# Patient Record
Sex: Male | Born: 1980 | Race: Black or African American | Hispanic: No | Marital: Single | Smoking: Current every day smoker
Health system: Southern US, Community
[De-identification: ages and names within clinical notes are randomized; demographics above are authoritative.]

## PROBLEM LIST (undated history)

## (undated) DIAGNOSIS — F1490 Cocaine use, unspecified, uncomplicated: Secondary | ICD-10-CM

## (undated) DIAGNOSIS — Z7901 Long term (current) use of anticoagulants: Secondary | ICD-10-CM

## (undated) DIAGNOSIS — M545 Low back pain, unspecified: Secondary | ICD-10-CM

## (undated) DIAGNOSIS — M4804 Spinal stenosis, thoracic region: Secondary | ICD-10-CM

## (undated) DIAGNOSIS — J449 Chronic obstructive pulmonary disease, unspecified: Secondary | ICD-10-CM

## (undated) DIAGNOSIS — F32A Depression, unspecified: Secondary | ICD-10-CM

## (undated) DIAGNOSIS — I2699 Other pulmonary embolism without acute cor pulmonale: Secondary | ICD-10-CM

## (undated) DIAGNOSIS — I10 Essential (primary) hypertension: Secondary | ICD-10-CM

## (undated) DIAGNOSIS — Z720 Tobacco use: Secondary | ICD-10-CM

## (undated) DIAGNOSIS — Z9289 Personal history of other medical treatment: Secondary | ICD-10-CM

## (undated) DIAGNOSIS — I82409 Acute embolism and thrombosis of unspecified deep veins of unspecified lower extremity: Secondary | ICD-10-CM

## (undated) HISTORY — DX: Long term (current) use of anticoagulants: Z79.01

## (undated) HISTORY — DX: Chronic obstructive pulmonary disease, unspecified: J44.9

## (undated) HISTORY — DX: Low back pain, unspecified: M54.50

## (undated) HISTORY — DX: Depression, unspecified: F32.A

## (undated) HISTORY — DX: Tobacco use: Z72.0

## (undated) HISTORY — DX: Spinal stenosis, thoracic region: M48.04

## (undated) HISTORY — DX: Cocaine use, unspecified, uncomplicated: F14.90

## (undated) HISTORY — DX: Essential (primary) hypertension: I10

## (undated) HISTORY — DX: Personal history of other medical treatment: Z92.89

## (undated) HISTORY — DX: Morbid (severe) obesity due to excess calories: E66.01

---

## 2004-08-02 ENCOUNTER — Emergency Department: Payer: Self-pay | Admitting: Emergency Medicine

## 2005-12-10 ENCOUNTER — Emergency Department: Payer: Self-pay | Admitting: Emergency Medicine

## 2007-09-26 ENCOUNTER — Emergency Department: Payer: Self-pay | Admitting: Emergency Medicine

## 2011-12-14 ENCOUNTER — Emergency Department: Payer: Self-pay | Admitting: Emergency Medicine

## 2012-07-21 ENCOUNTER — Emergency Department: Payer: Self-pay | Admitting: *Deleted

## 2012-07-21 LAB — COMPREHENSIVE METABOLIC PANEL
Anion Gap: 10 (ref 7–16)
Bilirubin,Total: 0.4 mg/dL (ref 0.2–1.0)
Calcium, Total: 8.7 mg/dL (ref 8.5–10.1)
Co2: 27 mmol/L (ref 21–32)
EGFR (African American): >60
EGFR (Non-African Amer.): >60
Glucose: 77 mg/dL (ref 65–99)
Osmolality: 282 (ref 275–301)
Total Protein: 7.7 g/dL (ref 6.4–8.2)

## 2012-07-21 LAB — CBC
HCT: 46.2 % (ref 40.0–52.0)
HGB: 16 g/dL (ref 13.0–18.0)
MCH: 34.5 pg — ABNORMAL HIGH (ref 26.0–34.0)
Platelet: 246 10*3/uL (ref 150–440)
RBC: 4.65 10*6/uL (ref 4.40–5.90)

## 2012-07-21 LAB — TROPONIN I: Troponin-I: <0.02 ng/mL

## 2012-08-08 ENCOUNTER — Emergency Department: Payer: Self-pay | Admitting: Internal Medicine

## 2013-04-09 ENCOUNTER — Emergency Department: Payer: Self-pay | Admitting: Emergency Medicine

## 2013-04-09 LAB — COMPREHENSIVE METABOLIC PANEL
Albumin: 4.1 g/dL (ref 3.4–5.0)
Anion Gap: 12 (ref 7–16)
Bilirubin,Total: 0.3 mg/dL (ref 0.2–1.0)
Chloride: 108 mmol/L — ABNORMAL HIGH (ref 98–107)
Co2: 20 mmol/L — ABNORMAL LOW (ref 21–32)
EGFR (Non-African Amer.): >60
Glucose: 76 mg/dL (ref 65–99)
Osmolality: 277 (ref 275–301)
Sodium: 140 mmol/L (ref 136–145)

## 2013-04-09 LAB — TSH: Thyroid Stimulating Horm: 1.01 u[IU]/mL

## 2013-04-09 LAB — CBC
HCT: 44.6 % (ref 40.0–52.0)
Platelet: 242 10*3/uL (ref 150–440)
RDW: 13.8 % (ref 11.5–14.5)
WBC: 13.5 10*3/uL — ABNORMAL HIGH (ref 3.8–10.6)

## 2013-10-23 ENCOUNTER — Emergency Department: Payer: Self-pay | Admitting: Emergency Medicine

## 2013-10-24 ENCOUNTER — Emergency Department: Payer: Self-pay | Admitting: Emergency Medicine

## 2014-01-01 ENCOUNTER — Emergency Department: Payer: Self-pay | Admitting: Emergency Medicine

## 2014-03-06 ENCOUNTER — Ambulatory Visit: Payer: Self-pay | Admitting: Oncology

## 2014-03-22 ENCOUNTER — Emergency Department: Payer: Self-pay | Admitting: Internal Medicine

## 2014-03-22 LAB — BASIC METABOLIC PANEL
Anion Gap: 8 (ref 7–16)
BUN: 12 mg/dL (ref 7–18)
CHLORIDE: 105 mmol/L (ref 98–107)
CO2: 26 mmol/L (ref 21–32)
CREATININE: 0.97 mg/dL (ref 0.60–1.30)
Calcium, Total: 8.9 mg/dL (ref 8.5–10.1)
EGFR (Non-African Amer.): >60
Glucose: 88 mg/dL (ref 65–99)
Osmolality: 277 (ref 275–301)
POTASSIUM: 3.8 mmol/L (ref 3.5–5.1)
SODIUM: 139 mmol/L (ref 136–145)

## 2014-03-22 LAB — CBC
HCT: 45 % (ref 40.0–52.0)
HGB: 15.1 g/dL (ref 13.0–18.0)
MCH: 32.4 pg (ref 26.0–34.0)
MCHC: 33.5 g/dL (ref 32.0–36.0)
MCV: 97 fL (ref 80–100)
Platelet: 251 10*3/uL (ref 150–440)
RBC: 4.65 10*6/uL (ref 4.40–5.90)
RDW: 13.4 % (ref 11.5–14.5)
WBC: 13.9 10*3/uL — AB (ref 3.8–10.6)

## 2014-03-30 LAB — COMPREHENSIVE METABOLIC PANEL
ALBUMIN: 3.6 g/dL (ref 3.4–5.0)
Alkaline Phosphatase: 64 U/L
Anion Gap: 5 — ABNORMAL LOW (ref 7–16)
BUN: 13 mg/dL (ref 7–18)
Bilirubin,Total: 0.2 mg/dL (ref 0.2–1.0)
CALCIUM: 9.1 mg/dL (ref 8.5–10.1)
Chloride: 107 mmol/L (ref 98–107)
Co2: 29 mmol/L (ref 21–32)
Creatinine: 1.16 mg/dL (ref 0.60–1.30)
EGFR (African American): >60
EGFR (Non-African Amer.): >60
GLUCOSE: 99 mg/dL (ref 65–99)
Osmolality: 281 (ref 275–301)
POTASSIUM: 3.9 mmol/L (ref 3.5–5.1)
SGOT(AST): 29 U/L (ref 15–37)
SGPT (ALT): 34 U/L (ref 12–78)
Sodium: 141 mmol/L (ref 136–145)
TOTAL PROTEIN: 7.7 g/dL (ref 6.4–8.2)

## 2014-03-30 LAB — CBC
HCT: 46 % (ref 40.0–52.0)
HGB: 15.4 g/dL (ref 13.0–18.0)
MCH: 32.3 pg (ref 26.0–34.0)
MCHC: 33.3 g/dL (ref 32.0–36.0)
MCV: 97 fL (ref 80–100)
Platelet: 324 10*3/uL (ref 150–440)
RBC: 4.75 10*6/uL (ref 4.40–5.90)
RDW: 13.2 % (ref 11.5–14.5)
WBC: 11.9 10*3/uL — ABNORMAL HIGH (ref 3.8–10.6)

## 2014-03-30 LAB — PROTIME-INR
INR: 1
Prothrombin Time: 13 secs (ref 11.5–14.7)

## 2014-03-30 LAB — TROPONIN I

## 2014-03-30 LAB — APTT: Activated PTT: 29.3 secs (ref 23.6–35.9)

## 2014-03-31 ENCOUNTER — Inpatient Hospital Stay: Payer: Self-pay | Admitting: Internal Medicine

## 2014-03-31 LAB — TROPONIN I
Troponin-I: <0.02 ng/mL
Troponin-I: <0.02 ng/mL

## 2014-03-31 LAB — CK-MB
CK-MB: <0.5 ng/mL — ABNORMAL LOW (ref 0.5–3.6)
CK-MB: <0.5 ng/mL — ABNORMAL LOW (ref 0.5–3.6)
CK-MB: <0.5 ng/mL — ABNORMAL LOW (ref 0.5–3.6)

## 2014-03-31 LAB — HEPARIN LEVEL (UNFRACTIONATED)
Anti-Xa(Unfractionated): >1.1 IU/mL (ref 0.30–0.70)
Anti-Xa(Unfractionated): 0.37 IU/mL (ref 0.30–0.70)

## 2014-04-01 LAB — HEMOGLOBIN: HGB: 13.8 g/dL (ref 13.0–18.0)

## 2014-04-01 LAB — HEPARIN LEVEL (UNFRACTIONATED): ANTI-XA(UNFRACTIONATED): 0.35 [IU]/mL (ref 0.30–0.70)

## 2014-04-01 LAB — PLATELET COUNT: PLATELETS: 289 10*3/uL (ref 150–440)

## 2014-04-05 ENCOUNTER — Ambulatory Visit: Payer: Self-pay | Admitting: Oncology

## 2014-04-10 ENCOUNTER — Emergency Department: Payer: Self-pay | Admitting: Emergency Medicine

## 2014-04-10 LAB — BASIC METABOLIC PANEL
ANION GAP: 6 — AB (ref 7–16)
BUN: 12 mg/dL (ref 7–18)
CALCIUM: 8.8 mg/dL (ref 8.5–10.1)
CO2: 29 mmol/L (ref 21–32)
CREATININE: 1.14 mg/dL (ref 0.60–1.30)
Chloride: 105 mmol/L (ref 98–107)
EGFR (African American): >60
EGFR (Non-African Amer.): >60
Glucose: 99 mg/dL (ref 65–99)
OSMOLALITY: 279 (ref 275–301)
Potassium: 4 mmol/L (ref 3.5–5.1)
Sodium: 140 mmol/L (ref 136–145)

## 2014-04-10 LAB — CBC
HCT: 44.2 % (ref 40.0–52.0)
HGB: 15 g/dL (ref 13.0–18.0)
MCH: 32.5 pg (ref 26.0–34.0)
MCHC: 33.9 g/dL (ref 32.0–36.0)
MCV: 96 fL (ref 80–100)
Platelet: 267 10*3/uL (ref 150–440)
RBC: 4.6 10*6/uL (ref 4.40–5.90)
RDW: 13.8 % (ref 11.5–14.5)
WBC: 12.4 10*3/uL — ABNORMAL HIGH (ref 3.8–10.6)

## 2014-04-10 LAB — PRO B NATRIURETIC PEPTIDE: B-TYPE NATIURETIC PEPTID: 26 pg/mL (ref 0–125)

## 2014-04-10 LAB — TROPONIN I: Troponin-I: <0.02 ng/mL

## 2014-08-11 ENCOUNTER — Emergency Department: Payer: Self-pay | Admitting: Emergency Medicine

## 2014-08-11 LAB — COMPREHENSIVE METABOLIC PANEL
ALT: 41 U/L
ANION GAP: 6 — AB (ref 7–16)
Albumin: 3.8 g/dL (ref 3.4–5.0)
Alkaline Phosphatase: 60 U/L
BUN: 20 mg/dL — ABNORMAL HIGH (ref 7–18)
Bilirubin,Total: 0.3 mg/dL (ref 0.2–1.0)
CALCIUM: 8.4 mg/dL — AB (ref 8.5–10.1)
CO2: 26 mmol/L (ref 21–32)
Chloride: 108 mmol/L — ABNORMAL HIGH (ref 98–107)
Creatinine: 1.3 mg/dL (ref 0.60–1.30)
EGFR (African American): >60
Glucose: 85 mg/dL (ref 65–99)
Osmolality: 281 (ref 275–301)
Potassium: 3.8 mmol/L (ref 3.5–5.1)
SGOT(AST): 31 U/L (ref 15–37)
Sodium: 140 mmol/L (ref 136–145)
Total Protein: 7.9 g/dL (ref 6.4–8.2)

## 2014-08-11 LAB — CBC
HCT: 45.1 % (ref 40.0–52.0)
HGB: 15.2 g/dL (ref 13.0–18.0)
MCH: 32.4 pg (ref 26.0–34.0)
MCHC: 33.7 g/dL (ref 32.0–36.0)
MCV: 96 fL (ref 80–100)
Platelet: 284 10*3/uL (ref 150–440)
RBC: 4.7 10*6/uL (ref 4.40–5.90)
RDW: 14 % (ref 11.5–14.5)
WBC: 12.6 10*3/uL — ABNORMAL HIGH (ref 3.8–10.6)

## 2014-08-11 LAB — TROPONIN I: Troponin-I: <0.02 ng/mL

## 2014-08-11 LAB — CK TOTAL AND CKMB (NOT AT ARMC)
CK, Total: 412 U/L — ABNORMAL HIGH
CK-MB: 1.5 ng/mL (ref 0.5–3.6)

## 2014-08-11 LAB — PROTIME-INR
INR: 1.1
Prothrombin Time: 13.7 secs (ref 11.5–14.7)

## 2014-08-11 LAB — D-DIMER(ARMC): D-Dimer: 278 ng/ml

## 2014-10-09 ENCOUNTER — Emergency Department: Payer: Self-pay | Admitting: Emergency Medicine

## 2014-10-09 LAB — COMPREHENSIVE METABOLIC PANEL
ALK PHOS: 60 U/L
Albumin: 3.4 g/dL (ref 3.4–5.0)
Anion Gap: 6 — ABNORMAL LOW (ref 7–16)
BUN: 7 mg/dL (ref 7–18)
Bilirubin,Total: 0.3 mg/dL (ref 0.2–1.0)
CALCIUM: 8.4 mg/dL — AB (ref 8.5–10.1)
CO2: 30 mmol/L (ref 21–32)
CREATININE: 1.11 mg/dL (ref 0.60–1.30)
Chloride: 107 mmol/L (ref 98–107)
Glucose: 75 mg/dL (ref 65–99)
OSMOLALITY: 282 (ref 275–301)
Potassium: 3.5 mmol/L (ref 3.5–5.1)
SGOT(AST): 30 U/L (ref 15–37)
SGPT (ALT): 45 U/L
Sodium: 143 mmol/L (ref 136–145)
Total Protein: 7.3 g/dL (ref 6.4–8.2)

## 2014-10-09 LAB — CBC
HCT: 42 % (ref 40.0–52.0)
HGB: 13.9 g/dL (ref 13.0–18.0)
MCH: 31.9 pg (ref 26.0–34.0)
MCHC: 33.2 g/dL (ref 32.0–36.0)
MCV: 96 fL (ref 80–100)
Platelet: 286 10*3/uL (ref 150–440)
RBC: 4.37 10*6/uL — ABNORMAL LOW (ref 4.40–5.90)
RDW: 13.8 % (ref 11.5–14.5)
WBC: 15.1 10*3/uL — ABNORMAL HIGH (ref 3.8–10.6)

## 2014-10-09 LAB — D-DIMER(ARMC): D-Dimer: 213 ng/ml

## 2014-10-09 LAB — TROPONIN I: Troponin-I: <0.02 ng/mL

## 2014-10-09 LAB — CK TOTAL AND CKMB (NOT AT ARMC)
CK, Total: 266 U/L (ref 39–308)
CK-MB: 1 ng/mL (ref 0.5–3.6)

## 2014-12-28 IMAGING — CT CT ANGIO CHEST
2 of 6 series · 18 of 36 positions shown · IV contrast (APPLIED)
Comparison: 03/30/2014

CLINICAL DATA: New diagnosis of chest pain. Patient is on Lovenox
for P/DVT.

EXAM:
CT ANGIOGRAPHY CHEST WITH CONTRAST
TECHNIQUE: Multidetector CT imaging of the chest was performed using the
standard protocol during bolus administration of intravenous
contrast. Multiplanar CT image reconstructions and MIPs were
obtained to evaluate the vascular anatomy.
CONTRAST:  80 mL Isovue 370

[Series 5: pe 1.0 thins · axial · 0.68mm/px · z∈[-308,-84]mm · 17 of 252 slices shown]
[im 14/252  lung]
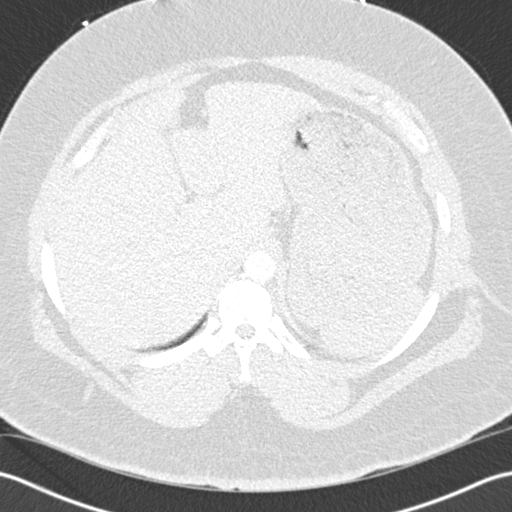
[im 28/252  mediastinal]
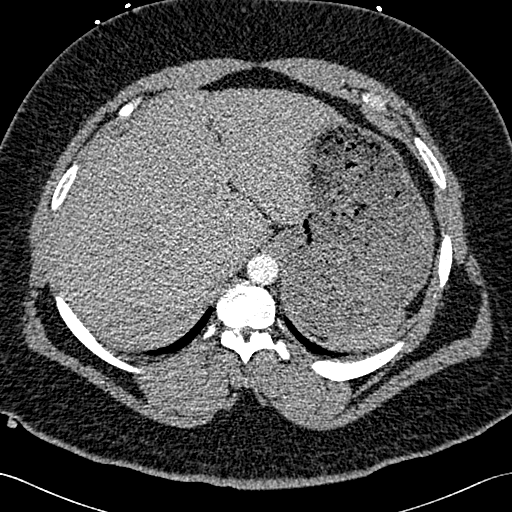
[im 42/252  lung]
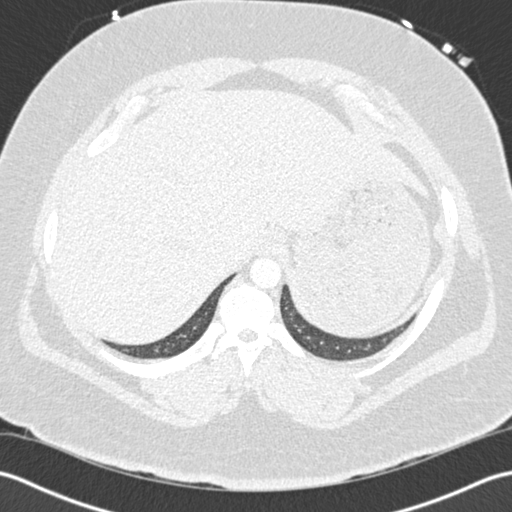
[im 56/252  mediastinal]
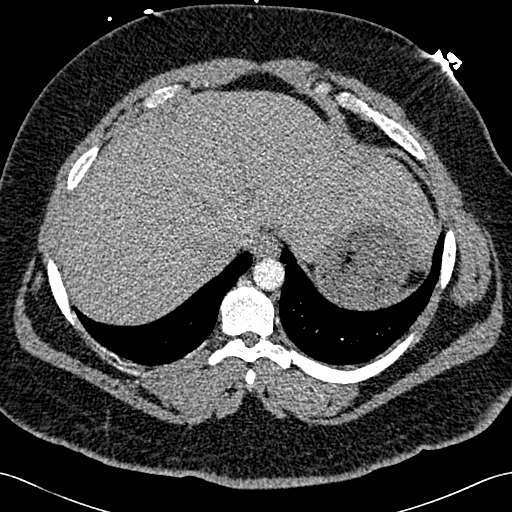
[im 70/252  lung]
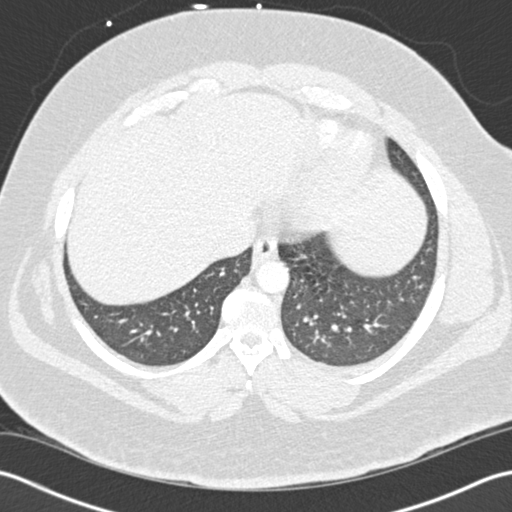
[im 84/252  mediastinal]
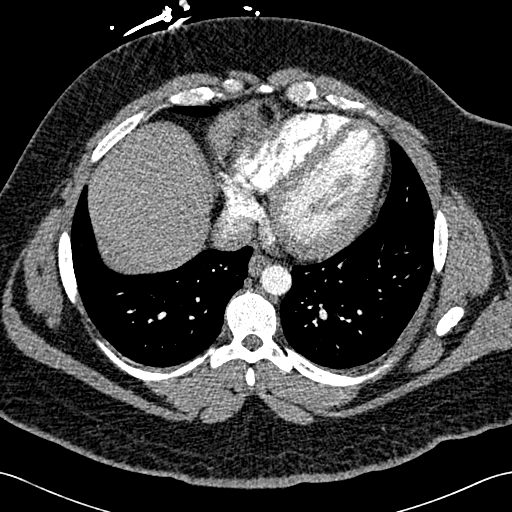
[im 98/252  lung]
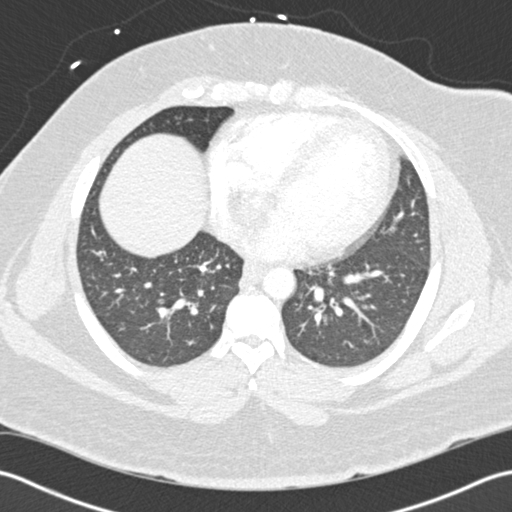
[im 112/252  mediastinal]
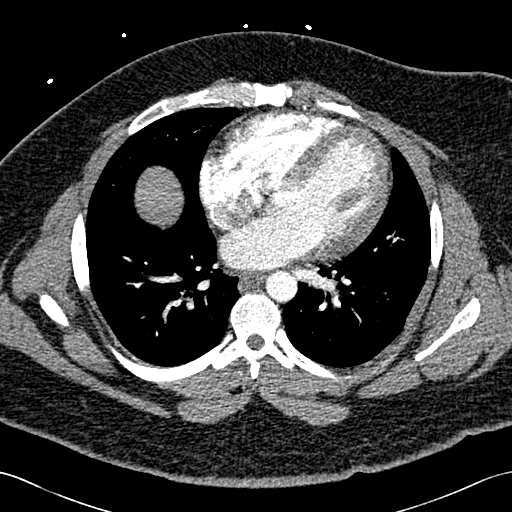
[im 126/252  lung]
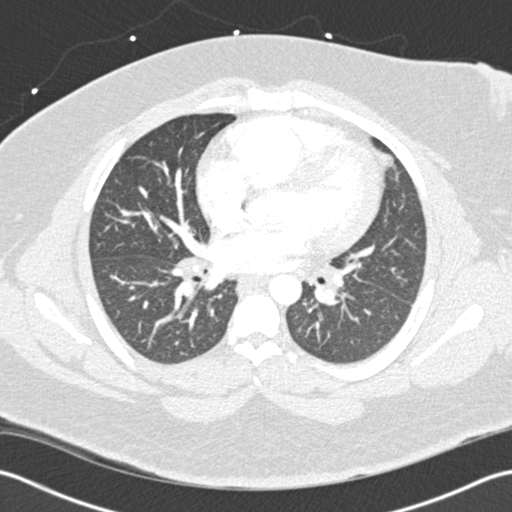
[im 140/252  mediastinal]
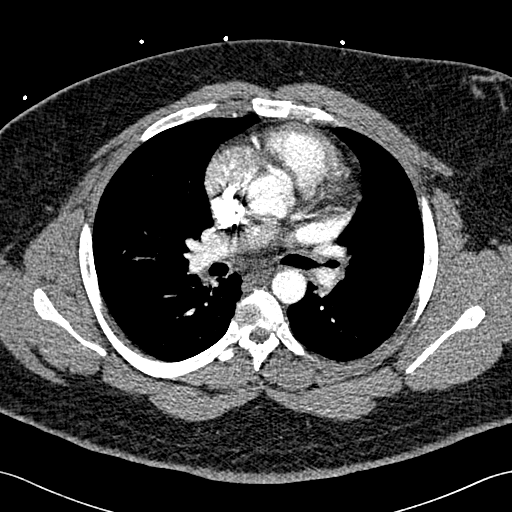
[im 154/252  lung]
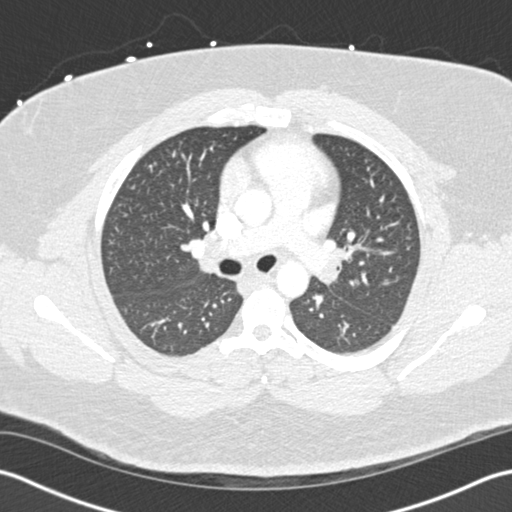
[im 168/252  mediastinal]
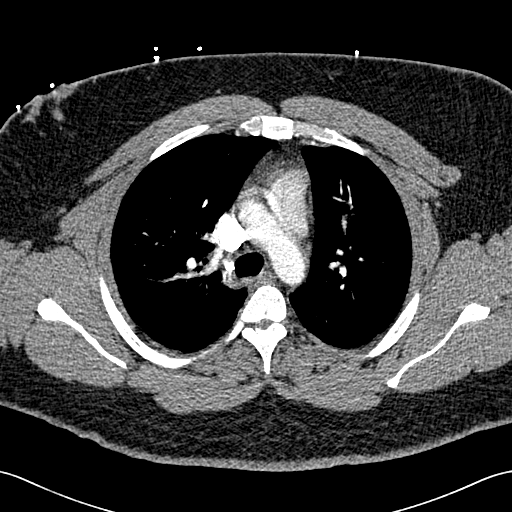
[im 182/252  lung]
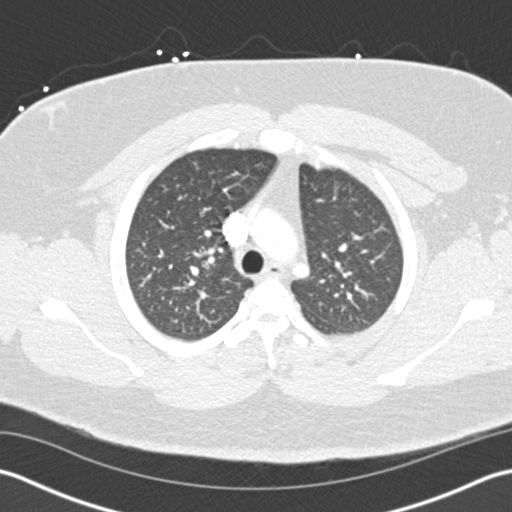
[im 196/252  mediastinal]
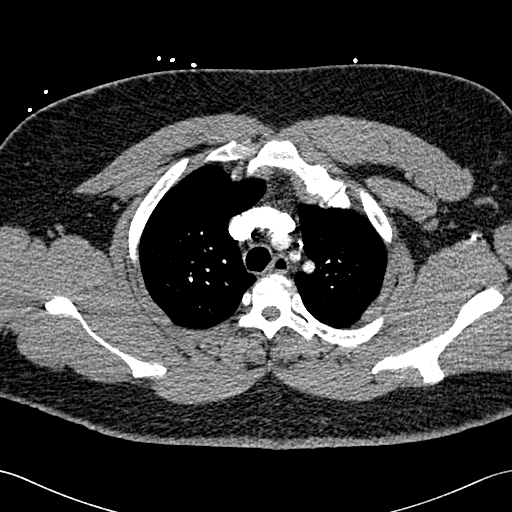
[im 210/252  lung]
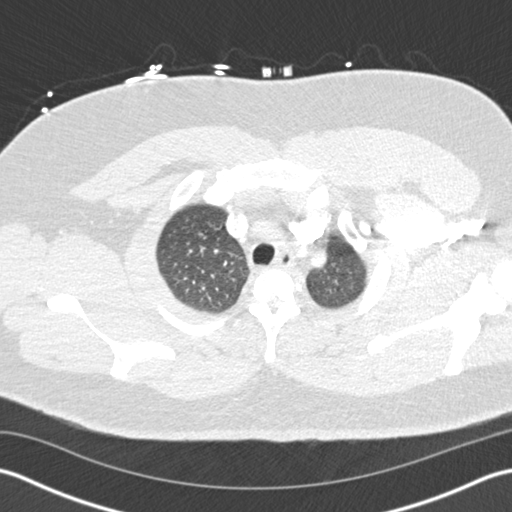
[im 224/252  mediastinal]
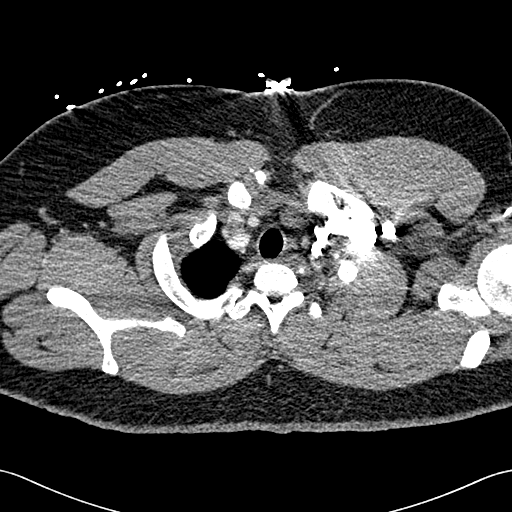
[im 238/252  lung]
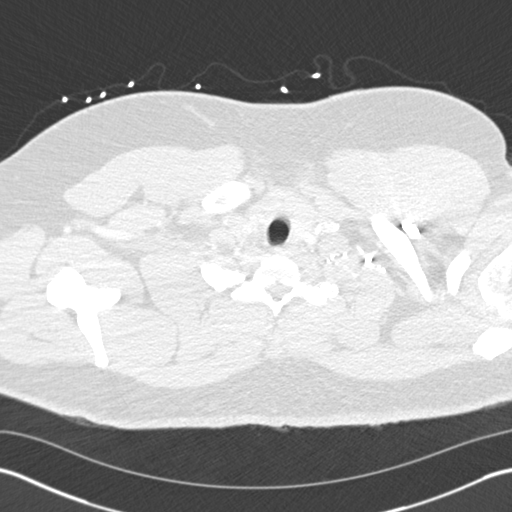

[Series 7: cor pe 2.0 mpr · coronal · 0.48mm/px · 1 of 140 slices shown]
[im 70/140  mediastinal]
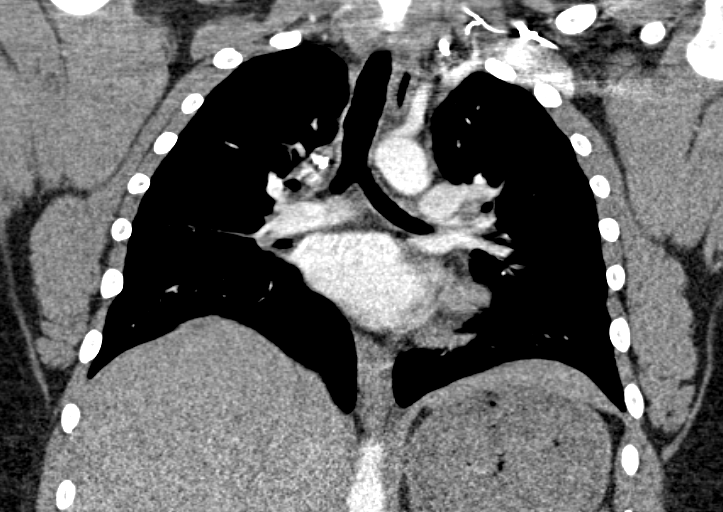

[18 of 36 positions shown; findings below may reference images not displayed]

FINDINGS: Suboptimal contrast bolus limits the examination. However, there
does appear to be persistent filling defect in the right lower lobe
segmental pulmonary arteries and in the distal aspect of the left
main pulmonary artery. The distribution is similar to the previous
study and this likely represents residual rather than recurrent
pulmonary embolus. No definite new areas of involvement are
visualized.

Normal heart size. Normal caliber thoracic aorta. No evidence of
dissection. Great vessel origins are patent. Esophagus is
decompressed. No significant lymphadenopathy in the chest. No
pleural effusions. Visualized portions of the upper abdominal organs
are grossly unremarkable. Visualization of lungs is limited due to
respiratory motion artifact but no focal consolidation is
appreciated. No pneumothorax.

Review of the MIP images confirms the above findings.
IMPRESSION: Persistent pulmonary emboli demonstrated in the distal left main
pulmonary artery and in the right lower lobe branch vessels, similar
to previous study consistent with residual rather than recurrent
pulmonary embolus. No focal consolidation in the lungs.

## 2015-01-27 NOTE — Discharge Summary (Signed)
PATIENT NAME:  Dalton Pena, Dalton Pena MR#:  [id number] DATE OF BIRTH:  1980/10/23  DATE OF ADMISSION:  03/31/2014 DATE OF DISCHARGE:  04/01/2014  PRIMARY CARE PHYSICIAN: None.  FINAL DIAGNOSES: 1.  Pulmonary embolism and deep vein thrombosis.  2.  Tobacco abuse.    MEDICATIONS ON DISCHARGE: Include Lovenox injections 120 mg subcutaneous injection every 12 hours, acetaminophen/oxycodone 325/5, 1 tablet every six hours as needed for pain, Colace 100 mg twice a day as needed for constipation. Stop taking the Eliquis 5 mg.   REFERRAL: To Dr. Darden DatesPandit's office; call on Monday for hospital follow-up appointment.   HOSPITAL COURSE: The patient was admitted 03/31/2014 with chest pain. He was recently diagnosed with deep vein thrombosis on 03/22/2014 and started on Eliquis treatment. He was prescribed 5 mg twice a day. On 03/31/2014, he had chest pain and was diagnosed with a pulmonary embolism, and admitted to the hospital for heparin drip. An oncology consultation was done by Dr. Doylene Canninghoksi, who recommended Lovenox injections and follow-up with Dr. Sherrlyn HockPandit as an outpatient. Laboratory and radiological data during the hospital course included an EKG that showed sinus rhythm, sinus arrhythmia. Troponin negative. INR 1.0. White blood cell count 11.9, hemoglobin and hematocrit 15.4 and 46.0, platelet count of 46. Glucose 99, BUN 13, creatinine 1.16, sodium 141, potassium 3.9, chloride 107, CO2 29, calcium 9.1. Liver function tests normal range. CT chest showed probable small bilateral pulmonary emboli, no evidence of right heart strain. Lungs are clear. Hemoglobin on discharge 13.8, platelet count 289.   HOSPITAL COURSE PER PROBLEM LIST:  1.  For the patient's pulmonary embolism, recent diagnosis of deep vein thrombosis, my personal opinion is I think his Eliquis dose was under dosed. Dosage for deep vein thrombosis treatment is 10 mg twice a day for seven days and then 5 mg twice a day after that. Dr. Doylene Canninghoksi recommended  Lovenox injections. I prescribed Lovenox 120 mg subcutaneous injection every 12 hours. I had the nursing staff teach the injections to the patient and his mother. I explained the risks of a blood thinner to the patient and his mother. Cost will be an issue for this patient, since he does not have insurance. I had the care manager set him up with samples of the Lovenox from the hospital. Hopefully, a quick followup appointment with Dr. Sherrlyn HockPandit and then consideration for putting the patient on one of the newer agents, proper dosing for deep vein thrombosis and PE. I did prescribe the patient some as-needed pain medication and constipation medication just in case. 2.  Tobacco abuse. Smoking cessation counseling was done on the hospital course.   TIME SPENT ON DISCHARGE: 35 minutes.   ____________________________ Herschell Dimesichard J. Renae GlossWieting, MD rjw:cg D: 04/01/2014 15:56:02 ET T: 04/02/2014 05:21:54 ET JOB#: [id number]  cc: Herschell Dimesichard J. Renae GlossWieting, MD, <Dictator> Sandeep R. Sherrlyn HockPandit, MD Gerome SamJanak K. Doylene Canninghoksi, MD Salley ScarletICHARD J WIETING MD ELECTRONICALLY SIGNED 04/03/2014 15:34

## 2015-01-27 NOTE — Consult Note (Signed)
History of Present Illness:  Reason for Consult pulmonary emboli with deep vein on ELIQUIS   HPI   OF PRESENT ILLNESS: [name] is a 34 year old obese male who was recently diagnosed with lower extremity DVT on March 22, 2014. At that time, the patient was found to have distal femoral vein thrombus with complete popliteal occlusion. The patient was discharged home with Eliquis. This afternoon the patient was watching TV and started to experience chest pain on the left side of the chest. The pain was worse when taking a deep breath. The patient was concerned and came to the Emergency Department. During workup in the Emergency Department CTA of  the chest was obtained which showed bilateral small pulmonary emboli without any evidence of right heart strain. Otherwise the laboratory workup was completely unremarkable.  The patient's last dose of Eliquis was at 3:00 p.m.   PFSH:  Additional Past Medical and Surgical History PAST SURGICAL HISTORY: None.   ALLERGIES: No known drug allergies.   HOME MEDICATIONS: Eliquis 5 mg 2 times a day.   SOCIAL HISTORY:  Prior to DVT the patient was smoking 1 pack of cigarettes per day.  He currently smokes 1-2 cigarettes per daily. He drinks 1-2 beers on a daily basis.  He uses marijuana. Currently not working.   FAMILY HISTORY: No family history of DVT.   Review of Systems:  General fatigue   Performance Status (ECOG) 0   HEENT no complaints   Lungs chest wall pain   Cardiac no complaints   GI no complaints   GU no complaints   Musculoskeletal no complaints   Extremities LOWER EXTREMITY SWELLING   Skin no complaints   Neuro no complaints   Psych anxiety   NURSING NOTES: **Vital Signs.:   26-Jun-15 12:40   Vital Signs Type: Routine   Temperature Temperature (F): 98   Celsius: 36.6   Temperature Source: oral   Pulse Pulse: 53   Respirations Respirations: 20   Systolic BP Systolic BP: 134   Diastolic BP (mmHg) Diastolic BP  (mmHg): 66   Mean BP: 88   Pulse Ox % Pulse Ox %: 95   Pulse Ox Activity Level: At rest   Oxygen Delivery: Room Air/ 21 %   Physical Exam:  General patient is alert oriented not in any acute distress   moderately obese   HEENT: normal   Lungs: clear   Cardiac: regular rate, rhythm   Abdomen: soft   Skin: intact   Musculoskeletal: swollen/deformed joints   Extremities: edema   Neuro: AAOx3  disoriented     DVT:    Denies medical history:    Denies surgical history.:    No Known Allergies:   Radiology Results: [place]:    17-Jun-15 17:55, [place] Color Flow Doppler Lower Extrem Right (Leg)  [place] Color Flow Doppler Lower Extrem Right (Leg)   REASON FOR EXAM:    Swelling and pain  COMMENTS:       PROCEDURE: [place]  - US DOPPLER LOW EXTR RIGHT  - Mar 22 2014  5:55PM     CLINICAL DATA:  Lower extremity pain and edema    EXAM:  RIGHT LOWER EXTREMITY VENOUS DUPLEX ULTRASOUND    TECHNIQUE:  Gray-scale sonography with graded compression, as well as color  Doppler and duplex ultrasound were performed to evaluate the lower  extremity deep venous systems from the level of the common femoral  vein and including the common femoral, femoral, profundafemoral,  popliteal and calf veins including  the posterior tibial, peroneal  and gastrocnemius veins when visible. The superficial great  saphenous vein was also interrogated. Spectral Doppler was utilized  to evaluate flow at rest and with distal augmentation maneuvers in  the common femoral, femoral and popliteal veins.    COMPARISON:  January 01, 2014    FINDINGS:  There is acute thrombus in the right popliteal vein. There is lack  of Doppler signal in this vessel. There is no compression or  augmentation of this vessel. There is no demonstrable flow seen in  this vessel. There is acute thrombus in the mid to distal  superficial femoral vein, a deep structure, as well. There is  limited Doppler flow in this area the diminished  compression and  augmentation. Elsewhere in the deep venous system, flow with  spontaneous and phasic. There is normal compression and augmentation  in all other areas of the deep venous system on the right. There is  no other thrombus is seen in the deep or visualized superficial  venous structures. There is no deep venous incompetence.     IMPRESSION:  Acute deep venous thrombosis in the right mid to distal superficial  femoral vein and in the popliteal vein. There appears to be complete  occlusion of the right popliteal vein by acute thrombus with partial  occlusion of the mid to distal right superficial femoral vein due to  thrombus.    These results were called by telephone at the time of interpretation  on 03/22/2014 at 6:02 PM to Dr. Glennie IsleSHERYL GOTTLIEB ,who verbally  acknowledged these results.      Electronically Signed    By: Bretta BangWilliam  Woodruff M.D.    On: 03/22/2014 18:04         Verified By: Rutherford GuysWILLIAM W. Margarita GrizzleWOODRUFF, M.D.,  LabUnknown:  PACS Image     25-Jun-15 22:43, CT ANGIOGRAPHY Chest with for PE  PACS Image    Assessment and Plan: Impression:   Deep vein thromboses of the right lower extremity diagnosed on April 21, 2014.  Patient was taking Eliquisemboli either new or persistent from previous diagnosis Plan:   History is not quite clear that patient had pulmonary emboli on eliquis in view of clinical picture patient needs to be sent home on Lovenox 1 mg per kilogram subcutaneously twice a daythere is no family history of deep vein thrombosis as well as no underlying factor causing deep vein thromboses patient needs to be evaluated for hypercoagulable state. make outpatient appointment to see Dr.Pandit  in  cancer center for further evaluation after discharge  Electronic Signatures: Laddie Aquashoksi, Janak K (MD)  (Signed 26-Jun-15 19:46)  Authored: HISTORY OF PRESENT ILLNESS, PFSH, ROS, NURSING NOTES, PE, PAST MEDICAL HISTORY, ALLERGIES, OTHER RESULTS, ASSESSMENT AND PLAN   Last  Updated: 26-Jun-15 19:46 by Laddie Aquashoksi, Janak K (MD)

## 2015-01-27 NOTE — H&P (Signed)
PATIENT NAME:  Dalton Pena, YOUNGE MR#:  [id number] DATE OF BIRTH:  05/28/1981  DATE OF ADMISSION:  03/31/2014.  PRIMARY CARE PHYSICIAN: None.   REFERRING PHYSICIAN: Dr. Duke Salvia    CHIEF COMPLAINT: Chest pain.   HISTORY OF PRESENT ILLNESS: Dalton Pena is a 34 year old obese male who was recently diagnosed with lower extremity DVT on March 22, 2014. At that time, the patient was found to have distal femoral vein thrombus with complete popliteal occlusion. The patient was discharged home with Eliquis. This afternoon the patient was watching TV and started to experience chest pain on the left side of the chest. The pain was worse when taking a deep breath. The patient was concerned and came to the Emergency Department. During workup in the Emergency Department CTA of  the chest was obtained which showed bilateral small pulmonary emboli without any evidence of right heart strain. Otherwise the laboratory workup was completely unremarkable.  The patient's last dose of Eliquis was at 3:00 p.m.   PAST MEDICAL HISTORY: Lower extremity DVT.  PAST SURGICAL HISTORY: None.   ALLERGIES: No known drug allergies.   HOME MEDICATIONS: Eliquis 5 mg 2 times a day.   SOCIAL HISTORY:  Prior to DVT the patient was smoking 1 pack of cigarettes per day.  He currently smokes 1-2 cigarettes per daily. He drinks 1-2 beers on a daily basis.  He uses marijuana. Currently not working.   FAMILY HISTORY: No family history of DVT.  REVIEW OF SYSTEMS:  CONSTITUTIONAL: Denies any generalized weakness.   EYES: No change in vision. ENT: No change in hearing.  RESPIRATORY: No cough, shortness of breath.  CARDIOVASCULAR: Has chest pain.  GASTROINTESTINAL: No nausea, vomiting, abdominal pain.  GENITOURINARY: No dysuria or hematuria.  HEMATOLOGIC: No easy bruising or bleeding.  SKIN: No rash or lesions.  MUSCULOSKELETAL: No joint pains or aches.  NEUROLOGIC: No weakness or numbness in any part of the body.   PHYSICAL  EXAMINATION:  GENERAL: This is a well-built, well-nourished, obese male lying down in the bed, not in any distress.  VITAL SIGNS: Temperature 98.8, pulse 50, blood pressure 133/78, respiratory rate of 16, oxygen saturation is 96% on room air.  HEENT: Head normocephalic, atraumatic. Eyes, no scleral icterus. Conjunctivae normal. Pupils equal and react to light. Extraocular movements are intact. Mucous membranes moist. No pharyngeal erythema.  NECK: Supple. No lymphadenopathy. No JVD. No carotid bruit.  CHEST: Has palpable tenderness on the left side of the chest.  LUNGS: Bilaterally clear to auscultation.  HEART: S1, S2 regular. No murmurs are heard.  ABDOMEN: Bowel sounds present. Soft, nontender, nondistended. No hepatosplenomegaly.  EXTREMITIES: No pedal edema. Pulses 2+.  SKIN: No rashes or lesions.  MUSCULOSKELETAL: Good range of motion in all the extremities.  NEUROLOGIC: The patient is alert, oriented to place, person, and time. Cranial nerves II-XII intact. Motor 5/5 in upper and lower extremities.   LABORATORY DATA: CBC and CMP are completely within normal limits. Coagulation profile is well within normal limits.  CT of the chest as mentioned above showed bilateral subsegmental PE. No evidence of right heart strain.   ASSESSMENT AND PLAN:  Dalton Pena is a 34 year old male who came in with chest pain. He was found to have bilateral small pulmonary emboli. The patient did not have a CT of the chest during his last visit when he was diagnosed with deep vein thrombosis. This chest pain the patient has seems to be more of a musculoskeletal pain and does not correlate to the  location of the pulmonary emboli. Unsure if the patient has failed Eliquis.  Will keep the patient on a heparin drip; however, will further discuss with the hematology in the morning regarding the patient's need to be continued on Eliquis.   Chest pain. The patient's chest pain seems to be more a musculoskeletal pain;  however, get serial cardiac enzymes x3.   Tobacco use. Counseled the patient, he expressed understanding.   TIME SPENT: 50 minutes.   ____________________________ Susa GriffinsPadmaja Vasireddy, MD pv:lt D: 03/31/2014 01:50:50 ET T: 03/31/2014 07:41:57 ET JOB#: [id number]  cc: Susa GriffinsPadmaja Vasireddy, MD, <Dictator> Susa GriffinsPADMAJA VASIREDDY MD ELECTRONICALLY SIGNED 04/02/2014 1:31

## 2015-03-15 ENCOUNTER — Encounter: Payer: Self-pay | Admitting: Emergency Medicine

## 2015-03-15 ENCOUNTER — Emergency Department
Admission: EM | Admit: 2015-03-15 | Discharge: 2015-03-15 | Disposition: A | Discharge status: Home / Self Care | Payer: Self-pay | Attending: Emergency Medicine | Admitting: Emergency Medicine

## 2015-03-15 DIAGNOSIS — Y9289 Other specified places as the place of occurrence of the external cause: Secondary | ICD-10-CM | POA: Insufficient documentation

## 2015-03-15 DIAGNOSIS — X58XXXA Exposure to other specified factors, initial encounter: Secondary | ICD-10-CM | POA: Insufficient documentation

## 2015-03-15 DIAGNOSIS — S76912A Strain of unspecified muscles, fascia and tendons at thigh level, left thigh, initial encounter: Secondary | ICD-10-CM | POA: Insufficient documentation

## 2015-03-15 DIAGNOSIS — Y998 Other external cause status: Secondary | ICD-10-CM | POA: Insufficient documentation

## 2015-03-15 DIAGNOSIS — Z720 Tobacco use: Secondary | ICD-10-CM | POA: Insufficient documentation

## 2015-03-15 DIAGNOSIS — Y9389 Activity, other specified: Secondary | ICD-10-CM | POA: Insufficient documentation

## 2015-03-15 MED ORDER — IBUPROFEN 800 MG PO TABS: 800.0000 mg | ORAL_TABLET | Freq: Three times a day (TID) | ORAL | Status: DC | PRN

## 2015-03-15 MED ORDER — CYCLOBENZAPRINE HCL 10 MG PO TABS
10.0000 mg | ORAL_TABLET | Freq: Once | ORAL | Status: AC
  Administered 2015-03-15: 10 mg via ORAL

## 2015-03-15 MED ORDER — IBUPROFEN 800 MG PO TABS
ORAL_TABLET | ORAL | Status: AC
  Administered 2015-03-15: 800 mg via ORAL
  Filled 2015-03-15: qty 1

## 2015-03-15 MED ORDER — CYCLOBENZAPRINE HCL 10 MG PO TABS
ORAL_TABLET | ORAL | Status: AC
  Administered 2015-03-15: 10 mg via ORAL
  Filled 2015-03-15: qty 1

## 2015-03-15 MED ORDER — CYCLOBENZAPRINE HCL 10 MG PO TABS: 10.0000 mg | ORAL_TABLET | Freq: Three times a day (TID) | ORAL | Status: DC | PRN

## 2015-03-15 MED ORDER — IBUPROFEN 800 MG PO TABS
800.0000 mg | ORAL_TABLET | Freq: Once | ORAL | Status: AC
  Administered 2015-03-15: 800 mg via ORAL

## 2015-03-15 NOTE — ED Notes (Signed)
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

## 2015-03-15 NOTE — ED Provider Notes (Signed)
[facility] Emergency Department Provider Note  ____________________________________________  Time seen: Approximately 4:43 PM  I have reviewed the triage vital signs and the nursing notes.   HISTORY  Chief Complaint Leg Pain    HPI Dalton Pena is a 34 y.o. male plan the left thigh pain since yesterday. Onset of pain approximately 3 hours after moving a tow motor. Patient rated the pain as a 6/10 and describes it as dull to sharp states pain increase with ambulation. States pain also increases with squatting movements. Denies any loss sensation or loss of strength. Patient stated intermittent pain to the left inguinal area. Denies any urinary complaints.   History reviewed. No pertinent past medical history.  There are no active problems to display for this patient.   History reviewed. No pertinent past surgical history.  Current Outpatient Rx  Name  Route  Sig  Dispense  Refill  . cyclobenzaprine (FLEXERIL) 10 MG tablet   Oral   Take 1 tablet (10 mg total) by mouth every 8 (eight) hours as needed for muscle spasms.   15 tablet   0   . ibuprofen (ADVIL,MOTRIN) 800 MG tablet   Oral   Take 1 tablet (800 mg total) by mouth every 8 (eight) hours as needed for moderate pain.   15 tablet   0     Allergies Review of patient's allergies indicates no known allergies.  No family history on file.  Social History History  Substance Use Topics  . Smoking status: Current Every Day Smoker  . Smokeless tobacco: Not on file  . Alcohol Use: No    Review of Systems Constitutional: No fever/chills Eyes: No visual changes. ENT: No sore throat. Cardiovascular: Denies chest pain. Respiratory: Denies shortness of breath. Gastrointestinal: No abdominal pain.  No nausea, no vomiting.  No diarrhea.  No constipation. Genitourinary: Negative for dysuria. Musculoskeletal: Left thigh pain. Skin: Negative for rash. Neurological: Negative for headaches,  focal weakness or numbness.  10-point ROS otherwise negative.  ____________________________________________   PHYSICAL EXAM:  VITAL SIGNS: ED Triage Vitals  Enc Vitals Group     BP 03/15/15 1636 128/62 mmHg     Pulse Rate 03/15/15 1636 78     Resp 03/15/15 1636 16     Temp 03/15/15 1636 98.4 F (36.9 C)     Temp Source 03/15/15 1636 Oral     SpO2 03/15/15 1636 96 %     Weight 03/15/15 1627 270 lb (122.471 kg)     Height 03/15/15 1627  (1.651 m)     Head Cir --      Peak Flow --      Pain Score 03/15/15 1627 6     Pain Loc --      Pain Edu? --      Excl. in GC? --    Constitutional: Alert and oriented. Well appearing and in no acute distress. Eyes: Conjunctivae are normal. PERRL. EOMI. Head: Atraumatic. Nose: No congestion/rhinnorhea. Mouth/Throat: Mucous membranes are moist.  Oropharynx non-erythematous. Neck: No stridor. No deformity for nuchal range of motion nontender palpation. Hematological/Lymphatic/Immunilogical: No cervical lymphadenopathy. Cardiovascular: Normal rate, regular rhythm. Grossly normal heart sounds.  Good peripheral circulation. Respiratory: Normal respiratory effort.  No retractions. Lungs CTAB. Gastrointestinal: Soft and nontender. No distention. No abdominal bruits. No CVA tenderness. Musculoskeletal: No obvious deformity of the left thigh. No edema. Guarding palpation of the medial thigh. Full nuchal range of motion. Neurovascular intact. No left inguinal mass or guarding. Neurologic:  Normal  speech and language. No gross focal neurologic deficits are appreciated. Speech is normal. No gait instability. Skin:  Skin is warm, dry and intact. No rash noted. Psychiatric: Mood and affect are normal. Speech and behavior are normal.  ____________________________________________   LABS (all labs ordered are listed, but only abnormal results are displayed)  Labs Reviewed - No data to  display ____________________________________________  EKG   ____________________________________________  RADIOLOGY   ____________________________________________   PROCEDURES  Procedure(s) performed: None  Critical Care performed: No  ____________________________________________   INITIAL IMPRESSION / ASSESSMENT AND PLAN / ED COURSE  Pertinent labs & imaging results that were available during my care of the patient were reviewed by me and considered in my medical decision making (see chart for details).  Muscle strain left thigh ____________________________________________   FINAL CLINICAL IMPRESSION(S) / ED DIAGNOSES  Final diagnoses:  Muscle strain of thigh, left, initial encounter      Joni Reining, PA-C 03/15/15 1656  Governor Rooks, MD 03/15/15 2250

## 2015-03-18 ENCOUNTER — Emergency Department
Admission: EM | Admit: 2015-03-18 | Discharge: 2015-03-18 | Disposition: A | Discharge status: Home / Self Care | Payer: Self-pay | Attending: Emergency Medicine | Admitting: Emergency Medicine

## 2015-03-18 DIAGNOSIS — X58XXXD Exposure to other specified factors, subsequent encounter: Secondary | ICD-10-CM | POA: Insufficient documentation

## 2015-03-18 DIAGNOSIS — Z720 Tobacco use: Secondary | ICD-10-CM | POA: Insufficient documentation

## 2015-03-18 DIAGNOSIS — S76912D Strain of unspecified muscles, fascia and tendons at thigh level, left thigh, subsequent encounter: Secondary | ICD-10-CM | POA: Insufficient documentation

## 2015-03-18 NOTE — ED Provider Notes (Signed)
[facility] Emergency Department Provider Note  ____________________________________________  Time seen: Approximately 8:58 PM  I have reviewed the triage vital signs and the nursing notes.   HISTORY  Chief Complaint Follow-up    HPI Dalton Pena is a 34 y.o. male patient requesting adjustment of his work status. Patient was seen on 03/15/2015 for strained thigh muscle of the left leg. He had a work excuse return him to work yesterday. Patient did not go to work yesterday secondary to complain of pain. Patient denies any pain today requesting adjustment of the work note for yesterday.   History reviewed. No pertinent past medical history.  There are no active problems to display for this patient.   History reviewed. No pertinent past surgical history.  Current Outpatient Rx  Name  Route  Sig  Dispense  Refill  . cyclobenzaprine (FLEXERIL) 10 MG tablet   Oral   Take 1 tablet (10 mg total) by mouth every 8 (eight) hours as needed for muscle spasms.   15 tablet   0   . ibuprofen (ADVIL,MOTRIN) 800 MG tablet   Oral   Take 1 tablet (800 mg total) by mouth every 8 (eight) hours as needed for moderate pain.   15 tablet   0     Allergies Review of patient's allergies indicates no known allergies.  History reviewed. No pertinent family history.  Social History History  Substance Use Topics  . Smoking status: Current Every Day Smoker  . Smokeless tobacco: Not on file  . Alcohol Use: No    Review of Systems Constitutional: No fever/chills Eyes: No visual changes. ENT: No sore throat. Cardiovascular: Denies chest pain. Respiratory: Denies shortness of breath. Gastrointestinal: No abdominal pain.  No nausea, no vomiting.  No diarrhea.  No constipation. Genitourinary: Negative for dysuria. Musculoskeletal: Negative for back pain. Skin: Negative for rash. Neurological: Negative for headaches, focal weakness or numbness. 10-point ROS  otherwise negative.  ____________________________________________   PHYSICAL EXAM:  VITAL SIGNS: ED Triage Vitals  Enc Vitals Group     BP 03/18/15 2052 131/61 mmHg     Pulse Rate 03/18/15 2052 71     Resp 03/18/15 2052 12     Temp 03/18/15 2052 98.5 F (36.9 C)     Temp Source 03/18/15 2052 Oral     SpO2 03/18/15 2052 99 %     Weight --      Height --      Head Cir --      Peak Flow --      Pain Score 03/18/15 2054 0     Pain Loc --      Pain Edu? --      Excl. in GC? --     Constitutional: Alert and oriented. Well appearing and in no acute distress. Eyes: Conjunctivae are normal. PERRL. EOMI. Head: Atraumatic. Nose: No congestion/rhinnorhea. Mouth/Throat: Mucous membranes are moist.  Oropharynx non-erythematous. Neck: No stridor Hematological/Lymphatic/Immunilogical: No cervical lymphadenopathy. Cardiovascular: Normal rate, regular rhythm. Grossly normal heart sounds.  Good peripheral circulation. Respiratory: Normal respiratory effort.  No retractions. Lungs CTAB. Gastrointestinal: Soft and nontender. No distention. No abdominal bruits. No CVA tenderness. Musculoskeletal: No lower extremity tenderness nor edema.  No joint effusions. Neurologic:  Normal speech and language. No gross focal neurologic deficits are appreciated. Speech is normal. No gait instability. Skin:  Skin is warm, dry and intact. No rash noted. Psychiatric: Mood and affect are normal. Speech and behavior are normal.  ____________________________________________   LABS (all  labs ordered are listed, but only abnormal results are displayed)  Labs Reviewed - No data to display ____________________________________________  EKG   ____________________________________________  RADIOLOGY   ____________________________________________   PROCEDURES  Procedure(s) performed: None  Critical Care performed: No  ____________________________________________   INITIAL IMPRESSION / ASSESSMENT  AND PLAN / ED COURSE  Pertinent labs & imaging results that were available during my care of the patient were reviewed by me and considered in my medical decision making (see chart for details).  Resolved muscle strain of the left thigh. ____________________________________________   FINAL CLINICAL IMPRESSION(S) / ED DIAGNOSES  Final diagnoses:  Muscle strain of left thigh, subsequent encounter      Joni Reining, PA-C 03/18/15 2101  Joni Reining, PA-C 03/18/15 2103  Sharyn Creamer, MD 03/19/15 [phone]

## 2015-03-18 NOTE — Discharge Instructions (Signed)
Advised patient work notes can not be back-dated.

## 2015-03-18 NOTE — ED Notes (Addendum)
Pt would like work note adjusted based upon current status.

## 2015-03-18 NOTE — ED Notes (Signed)
Pt alert and in NAD at time of d/c 

## 2016-01-08 ENCOUNTER — Emergency Department
Admission: EM | Admit: 2016-01-08 | Discharge: 2016-01-08 | Disposition: A | Discharge status: Home / Self Care | Payer: Self-pay | Attending: Emergency Medicine | Admitting: Emergency Medicine

## 2016-01-08 ENCOUNTER — Encounter: Payer: Self-pay | Admitting: Emergency Medicine

## 2016-01-08 DIAGNOSIS — F17200 Nicotine dependence, unspecified, uncomplicated: Secondary | ICD-10-CM | POA: Insufficient documentation

## 2016-01-08 DIAGNOSIS — B349 Viral infection, unspecified: Secondary | ICD-10-CM | POA: Insufficient documentation

## 2016-01-08 MED ORDER — ACETAMINOPHEN-CODEINE #3 300-30 MG PO TABS: 1.0000 | ORAL_TABLET | ORAL | Status: DC | PRN

## 2016-01-08 NOTE — ED Notes (Signed)
Patient presents to the ED with sore throat that woke patient up from sleep at midnight.  Patient reports tenderness when he "pushes" on his throat and pain when he coughs and swallows.  Patient is in no respiratory distress at this time.  Speaking in full sentences without obvious difficulty.

## 2016-01-08 NOTE — ED Notes (Signed)
Called in Flex and Main wait with no answer.

## 2016-01-08 NOTE — ED Notes (Signed)
Rapid strep negative.

## 2016-01-08 NOTE — ED Provider Notes (Signed)
CSN: [id number]     Arrival date & time 01/08/16  1817 History   First MD Initiated Contact with Patient 01/08/16 1914     Chief Complaint  Patient presents with  . Sore Throat     HPI  35 year old male who presents to the emergency department for evaluation of sore throat. He states that he woke up last night around midnight with a sore throat. He has tenderness in his neck and pain when he coughs and swallows. He has not taken anything to help with the pain.  History reviewed. No pertinent past medical history. History reviewed. No pertinent past surgical history. No family history on file. Social History  Substance Use Topics  . Smoking status: Current Every Day Smoker  . Smokeless tobacco: None  . Alcohol Use: No    Review of Systems  Constitutional: Positive for chills. Negative for fever.  HENT: Positive for rhinorrhea, sinus pressure and sore throat. Negative for trouble swallowing.   Respiratory: Positive for cough. Negative for wheezing.   Gastrointestinal: Negative for nausea, vomiting and diarrhea.  Skin: Negative.       Allergies  Review of patient's allergies indicates no known allergies.  Home Medications   Prior to Admission medications   Medication Sig Start Date End Date Taking? Authorizing Provider  acetaminophen-codeine (TYLENOL #3) 300-30 MG tablet Take 1-2 tablets by mouth every 4 (four) hours as needed for moderate pain. 01/08/16   Chinita Pesterari B Nyeema Want, FNP  cyclobenzaprine (FLEXERIL) 10 MG tablet Take 1 tablet (10 mg total) by mouth every 8 (eight) hours as needed for muscle spasms. 03/15/15   Joni Reiningonald K Smith, PA-C  ibuprofen (ADVIL,MOTRIN) 800 MG tablet Take 1 tablet (800 mg total) by mouth every 8 (eight) hours as needed for moderate pain. 03/15/15   Joni Reiningonald K Smith, PA-C   BP 113/62 mmHg  Pulse 94  Temp(Src) 99.9 F (37.7 C) (Oral)  Resp 18  Ht 5\' 5"  (1.651 m)  Wt 108.863 kg  BMI 39.94 kg/m2  SpO2 97% Physical Exam  Constitutional: He is oriented to  person, place, and time. He appears well-developed and well-nourished.  HENT:  Head: Normocephalic.  Mouth/Throat: Posterior oropharyngeal edema and posterior oropharyngeal erythema present. No oropharyngeal exudate or tonsillar abscesses.  Eyes: EOM are normal.  Neck: Normal range of motion.  Pulmonary/Chest: Effort normal and breath sounds normal.  Musculoskeletal: Normal range of motion.  Neurological: He is alert and oriented to person, place, and time.  Skin: Skin is warm and dry.  Psychiatric: His behavior is normal.  Nursing note and vitals reviewed.   ED Course  Procedures (including critical care time) Labs Review Labs Reviewed - No data to display  Imaging Review No results found. I have personally reviewed and evaluated these images and lab results as part of my medical decision-making.   EKG Interpretation None      MDM   Final diagnoses:  Acute viral syndrome    Patient was encouraged to rest and drink lots of fluids. He was given a prescription for Tylenol with Codeine for her body aches and fever and cough. He was advised to establish primary care provider, follow-up with the health clinic or return to the emergency department for symptoms that change or worsen if medications do not help.    Chinita PesterCari B Loye Vento, FNP 01/09/16 0006  Sharman CheekPhillip Stafford, MD 01/10/16 [phone]

## 2016-01-08 NOTE — ED Notes (Signed)
Pt discharged to home.  Family member driving.  Discharge instructions reviewed.  Verbalized understanding.  No questions or concerns at this time.  Teach back verified.  Pt in NAD.  No items left in ED.   

## 2016-01-08 NOTE — Discharge Instructions (Signed)
Viral Infections °A viral infection can be caused by different types of viruses. Most viral infections are not serious and resolve on their own. However, some infections may cause severe symptoms and may lead to further complications. °SYMPTOMS °Viruses can frequently cause: °· Minor sore throat. °· Aches and pains. °· Headaches. °· Runny nose. °· Different types of rashes. °· Watery eyes. °· Tiredness. °· Cough. °· Loss of appetite. °· Gastrointestinal infections, resulting in nausea, vomiting, and diarrhea. °These symptoms do not respond to antibiotics because the infection is not caused by bacteria. However, you might catch a bacterial infection following the viral infection. This is sometimes called a "superinfection." Symptoms of such a bacterial infection may include: °· Worsening sore throat with pus and difficulty swallowing. °· Swollen neck glands. °· Chills and a high or persistent fever. °· Severe headache. °· Tenderness over the sinuses. °· Persistent overall ill feeling (malaise), muscle aches, and tiredness (fatigue). °· Persistent cough. °· Yellow, green, or brown mucus production with coughing. °HOME CARE INSTRUCTIONS  °· Only take over-the-counter or prescription medicines for pain, discomfort, diarrhea, or fever as directed by your caregiver. °· Drink enough water and fluids to keep your urine clear or pale yellow. Sports drinks can provide valuable electrolytes, sugars, and hydration. °· Get plenty of rest and maintain proper nutrition. Soups and broths with crackers or rice are fine. °SEEK IMMEDIATE MEDICAL CARE IF:  °· You have severe headaches, shortness of breath, chest pain, neck pain, or an unusual rash. °· You have uncontrolled vomiting, diarrhea, or you are unable to keep down fluids. °· You or your child has an oral temperature above 102° F (38.9° C), not controlled by medicine. °· Your baby is older than 3 months with a rectal temperature of 102° F (38.9° C) or higher. °· Your baby is 3  months old or younger with a rectal temperature of 100.4° F (38° C) or higher. °MAKE SURE YOU:  °· Understand these instructions. °· Will watch your condition. °· Will get help right away if you are not doing well or get worse. °  °This information is not intended to replace advice given to you by your health care provider. Make sure you discuss any questions you have with your health care provider. °  °Document Released: 07/02/2005 Document Revised: 12/15/2011 Document Reviewed: 02/28/2015 °Elsevier Interactive Patient Education ©2016 [employer] ° °

## 2016-01-09 ENCOUNTER — Emergency Department: Payer: Self-pay

## 2016-01-09 ENCOUNTER — Emergency Department
Admission: EM | Admit: 2016-01-09 | Discharge: 2016-01-09 | Disposition: A | Discharge status: Home / Self Care | Payer: Self-pay | Attending: Emergency Medicine | Admitting: Emergency Medicine

## 2016-01-09 ENCOUNTER — Encounter: Payer: Self-pay | Admitting: Medical Oncology

## 2016-01-09 DIAGNOSIS — J029 Acute pharyngitis, unspecified: Secondary | ICD-10-CM | POA: Insufficient documentation

## 2016-01-09 DIAGNOSIS — F17200 Nicotine dependence, unspecified, uncomplicated: Secondary | ICD-10-CM | POA: Insufficient documentation

## 2016-01-09 MED ORDER — LIDOCAINE VISCOUS 2 % MT SOLN: 20.0000 mL | OROMUCOSAL | Status: DC | PRN

## 2016-01-09 MED ORDER — PENICILLIN G BENZATHINE 1200000 UNIT/2ML IM SUSP
1.2000 10*6.[IU] | Freq: Once | INTRAMUSCULAR | Status: AC
  Administered 2016-01-09: 1.2 10*6.[IU] via INTRAMUSCULAR
  Filled 2016-01-09: qty 2

## 2016-01-09 MED ORDER — LIDOCAINE VISCOUS 2 % MT SOLN
15.0000 mL | Freq: Once | OROMUCOSAL | Status: AC
  Administered 2016-01-09: 15 mL via OROMUCOSAL
  Filled 2016-01-09: qty 15

## 2016-01-09 NOTE — Discharge Instructions (Signed)

## 2016-01-09 NOTE — ED Notes (Signed)
Per pt he has been having sore throat since Monday, was seen here last night and sent home, pt reports no improvement.

## 2016-01-09 NOTE — ED Provider Notes (Signed)
[facility] Emergency Department Provider Note  ____________________________________________  Time seen: Approximately 9:38 AM  I have reviewed the triage vital signs and the nursing notes.   HISTORY  Chief Complaint Sore Throat    HPI Dalton Pena is a 35 y.o. male resents for evaluation of severe sore throat. Patient reports being seen here last night and throat is 10 times worse. Was given some Tylenol with Codeine last night. Patient reports that he is having a difficult time swallowing his own saliva secondary to the pain. Unable to eat or drink.   History reviewed. No pertinent past medical history.  There are no active problems to display for this patient.   History reviewed. No pertinent past surgical history.  Current Outpatient Rx  Name  Route  Sig  Dispense  Refill  . acetaminophen-codeine (TYLENOL #3) 300-30 MG tablet   Oral   Take 1-2 tablets by mouth every 4 (four) hours as needed for moderate pain.   12 tablet   0   . lidocaine (XYLOCAINE) 2 % solution   Mouth/Throat   Use as directed 20 mLs in the mouth or throat as needed for mouth pain.   100 mL   0     Allergies Review of patient's allergies indicates no known allergies.  No family history on file.  Social History Social History  Substance Use Topics  . Smoking status: Current Every Day Smoker  . Smokeless tobacco: None  . Alcohol Use: No    Review of Systems Constitutional: Positive fever/chills Eyes: No visual changes. ENT: Positive for severe throat. Positive difficulty talking. Cardiovascular: Denies chest pain. Respiratory: Denies shortness of breath. Musculoskeletal: Negative for back pain. Skin: Negative for rash. Neurological: Negative for headaches, focal weakness or numbness.  10-point ROS otherwise negative.  ____________________________________________   PHYSICAL EXAM:  VITAL SIGNS: ED Triage Vitals  Enc Vitals Group     BP 01/09/16  0933 142/65 mmHg     Pulse Rate 01/09/16 0933 79     Resp 01/09/16 0933 19     Temp 01/09/16 0933 99.2 F (37.3 C)     Temp Source 01/09/16 0933 Oral     SpO2 01/09/16 0933 98 %     Weight 01/09/16 0933 240 lb (108.863 kg)     Height 01/09/16 0933  (1.651 m)     Head Cir --      Peak Flow --      Pain Score 01/09/16 0934 10     Pain Loc --      Pain Edu? --      Excl. in GC? --     Constitutional: Alert and oriented. Well appearing and in no acute distress. Head: Atraumatic. Nose: No congestion/rhinnorhea. Mouth/Throat: Mucous membranes are moist.  Oropharynx is extremely erythematous with no exudate. Right tonsillar edema 2+ Neck: No stridor.  Positive cervical adenopathy. Cardiovascular: Normal rate, regular rhythm. Grossly normal heart sounds.  Good peripheral circulation. Respiratory: Normal respiratory effort.  No retractions. Lungs CTAB. Musculoskeletal: No lower extremity tenderness nor edema.  No joint effusions. Neurologic:  Difficulty speaking secondary to pain and throat.. No gross focal neurologic deficits are appreciated. No gait instability. Skin:  Skin is warm, dry and intact. No rash noted. Psychiatric: Mood and affect are normal. Speech and behavior are normal.  ____________________________________________   LABS (all labs ordered are listed, but only abnormal results are displayed)  Labs Reviewed - No data to display ____________________________________________   RADIOLOGY  Negative for  any epiglottis swelling or narrowing of the airway. ____________________________________________   PROCEDURES  Procedure(s) performed: None  Critical Care performed: No  ____________________________________________   INITIAL IMPRESSION / ASSESSMENT AND PLAN / ED COURSE  Pertinent labs & imaging results that were available during my care of the patient were reviewed by me and considered in my medical decision making (see chart for details).  Acute  pharyngitis. Rx given for viscous lidocaine to ease in the swallowing and pain. Bicillin L-A 1.2 million units IM given while in the ED. Work excuse 48 hours given. Patient follow-up PCP or return to ER with worsening symptomology. ____________________________________________   FINAL CLINICAL IMPRESSION(S) / ED DIAGNOSES  Final diagnoses:  Acute pharyngitis, unspecified pharyngitis type     This chart was dictated using voice recognition software/Dragon. Despite best efforts to proofread, errors can occur which can change the meaning. Any change was purely unintentional.   Evangeline Dakinharles M Beers, PA-C 01/09/16 1057

## 2016-02-05 ENCOUNTER — Emergency Department
Admission: EM | Admit: 2016-02-05 | Discharge: 2016-02-06 | Disposition: A | Discharge status: Home / Self Care | Payer: Self-pay | Attending: Emergency Medicine | Admitting: Emergency Medicine

## 2016-02-05 DIAGNOSIS — J189 Pneumonia, unspecified organism: Secondary | ICD-10-CM | POA: Insufficient documentation

## 2016-02-05 DIAGNOSIS — I82401 Acute embolism and thrombosis of unspecified deep veins of right lower extremity: Secondary | ICD-10-CM | POA: Insufficient documentation

## 2016-02-05 DIAGNOSIS — F17200 Nicotine dependence, unspecified, uncomplicated: Secondary | ICD-10-CM | POA: Insufficient documentation

## 2016-02-05 NOTE — ED Notes (Signed)
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

## 2016-02-06 ENCOUNTER — Encounter: Payer: Self-pay | Admitting: Radiology

## 2016-02-06 ENCOUNTER — Emergency Department: Payer: Self-pay

## 2016-02-06 LAB — BASIC METABOLIC PANEL
Anion gap: 6 (ref 5–15)
BUN: 15 mg/dL (ref 6–20)
CHLORIDE: 110 mmol/L (ref 101–111)
CO2: 25 mmol/L (ref 22–32)
CREATININE: 1.32 mg/dL — AB (ref 0.61–1.24)
Calcium: 8.7 mg/dL — ABNORMAL LOW (ref 8.9–10.3)
GFR calc non Af Amer: >60 mL/min (ref >60)
GLUCOSE: 96 mg/dL (ref 65–99)
Potassium: 3.8 mmol/L (ref 3.5–5.1)
Sodium: 141 mmol/L (ref 135–145)

## 2016-02-06 LAB — CBC
HCT: 43 % (ref 40.0–52.0)
Hemoglobin: 14.8 g/dL (ref 13.0–18.0)
MCH: 32.1 pg (ref 26.0–34.0)
MCHC: 34.5 g/dL (ref 32.0–36.0)
MCV: 93.1 fL (ref 80.0–100.0)
Platelets: 235 K/uL (ref 150–440)
RBC: 4.61 MIL/uL (ref 4.40–5.90)
RDW: 14.7 % — ABNORMAL HIGH (ref 11.5–14.5)
WBC: 16.3 K/uL — ABNORMAL HIGH (ref 3.8–10.6)

## 2016-02-06 LAB — TROPONIN I
Troponin I: <0.03 ng/mL (ref <0.031)
Troponin I: <0.03 ng/mL (ref <0.031)

## 2016-02-06 MED ORDER — LEVOFLOXACIN 500 MG PO TABS: 500.0000 mg | ORAL_TABLET | Freq: Every day | ORAL | Status: AC

## 2016-02-06 MED ORDER — RIVAROXABAN 15 MG PO TABS
15.0000 mg | ORAL_TABLET | Freq: Once | ORAL | Status: AC
  Administered 2016-02-06: 15 mg via ORAL
  Filled 2016-02-06: qty 1

## 2016-02-06 MED ORDER — IOPAMIDOL (ISOVUE-370) INJECTION 76%
100.0000 mL | Freq: Once | INTRAVENOUS | Status: AC | PRN
  Administered 2016-02-06: 100 mL via INTRAVENOUS

## 2016-02-06 MED ORDER — RIVAROXABAN 15 MG PO TABS: 15.0000 mg | ORAL_TABLET | Freq: Every day | ORAL | Status: DC

## 2016-02-06 MED ORDER — ONDANSETRON HCL 4 MG/2ML IJ SOLN
4.0000 mg | Freq: Once | INTRAMUSCULAR | Status: AC
  Administered 2016-02-06: 4 mg via INTRAVENOUS
  Filled 2016-02-06: qty 2

## 2016-02-06 MED ORDER — MORPHINE SULFATE (PF) 2 MG/ML IV SOLN
2.0000 mg | Freq: Once | INTRAVENOUS | Status: AC
  Administered 2016-02-06: 2 mg via INTRAVENOUS
  Filled 2016-02-06: qty 1

## 2016-02-06 MED ORDER — LEVOFLOXACIN 500 MG PO TABS
500.0000 mg | ORAL_TABLET | Freq: Once | ORAL | Status: AC
  Administered 2016-02-06: 500 mg via ORAL
  Filled 2016-02-06: qty 1

## 2016-02-06 NOTE — ED Provider Notes (Signed)
[facility] Emergency Department Provider Note  ____________________________________________  Time seen: 12:05 AM  I have reviewed the triage vital signs and the nursing notes.   HISTORY  Chief Complaint Chest Pain     HPI Dalton Pena is a 35 y.o. 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 Patient denies any fever or cough. Patient states his current pain score 6 out of 10. Patient states pain is worsened with deep inspiration. Patient denies any alleviating factors     No past medical history on file.  There are no active problems to display for this patient.   No past surgical history on file.  Current Outpatient Rx  Name  Route  Sig  Dispense  Refill  . acetaminophen-codeine (TYLENOL #3) 300-30 MG tablet   Oral   Take 1-2 tablets by mouth every 4 (four) hours as needed for moderate pain.   12 tablet   0   . lidocaine (XYLOCAINE) 2 % solution   Mouth/Throat   Use as directed 20 mLs in the mouth or throat as needed for mouth pain.   100 mL   0     Allergies Review of patient's allergies indicates no known allergies.  No family history on file.  Social History Social History  Substance Use Topics  . Smoking status: Current Every Day Smoker  . Smokeless tobacco: Not on file  . Alcohol Use: No    Review of Systems  Constitutional: Negative for fever. Eyes: Negative for visual changes. ENT: Negative for sore throat. Cardiovascular: Positive for chest pain. Respiratory: Negative for shortness of breath. Gastrointestinal: Negative for abdominal pain, vomiting and diarrhea. Genitourinary: Negative for dysuria. Musculoskeletal: Negative for back pain.Positive for right leg pain and  swelling Skin: Negative for rash. Neurological: Negative for headaches, focal weakness or numbness.  10-point ROS otherwise negative.  ____________________________________________   PHYSICAL EXAM:  VITAL SIGNS: ED Triage Vitals  Enc Vitals Group     BP 02/05/16 2349 140/80 mmHg     Pulse Rate 02/05/16 2349 82     Resp 02/05/16 2349 18     Temp 02/05/16 2349 98.4 F (36.9 C)     Temp Source 02/05/16 2349 Oral     SpO2 02/05/16 2349 96 %     Weight 02/05/16 2349 260 lb (117.935 kg)     Height 02/05/16 2349  (1.651 m)     Head Cir --      Peak Flow --      Pain Score 02/05/16 2353 9     Pain Loc --      Pain Edu? --      Excl. in GC? --      Constitutional: Alert and oriented. Well appearing and in no distress. Eyes: Conjunctivae are normal. PERRL. Normal extraocular movements. ENT   Head: Normocephalic and atraumatic.   Nose: No congestion/rhinnorhea.   Mouth/Throat: Mucous membranes are moist.   Neck: No stridor. Hematological/Lymphatic/Immunilogical: No cervical lymphadenopathy. Cardiovascular: Normal rate, regular rhythm. Normal and symmetric distal pulses are present in all extremities. No murmurs, rubs, or gallops. Respiratory: Normal respiratory effort without tachypnea nor retractions. Breath sounds are clear and equal bilaterally. No wheezes/rales/rhonchi. Gastrointestinal: Soft and nontender. No distention. There is no CVA tenderness. Genitourinary: deferred Musculoskeletal: Nontender with  normal range of motion in all extremities. No joint effusions.  No lower extremity tenderness nor edema. Neurologic:  Normal speech and language. No gross focal neurologic deficits are appreciated. Speech is normal.  Skin:  Skin is warm, dry and intact. No rash noted. Psychiatric: Mood and affect are normal. Speech and behavior are normal. Patient exhibits appropriate insight and judgment.  ____________________________________________    LABS (pertinent  positives/negatives)  Labs Reviewed  BASIC METABOLIC PANEL - Abnormal; Notable for the following:    Creatinine, Ser 1.32 (*)    Calcium 8.7 (*)    All other components within normal limits  CBC - Abnormal; Notable for the following:    WBC 16.3 (*)    RDW 14.7 (*)    All other components within normal limits  TROPONIN I  TROPONIN I       RADIOLOGY  CT Angio Chest PE W/Cm &/Or Wo Cm (Final result) Result time: 02/06/16 01:44:27   Final result by Rad Results In Interface (02/06/16 01:44:27)   Narrative:   CLINICAL DATA: Right lower leg pain and chest pain. History of DVT and PE 1 year ago.  EXAM: CT ANGIOGRAPHY CHEST WITH CONTRAST  TECHNIQUE: Multidetector CT imaging of the chest was performed using the standard protocol during bolus administration of intravenous contrast. Multiplanar CT image reconstructions and MIPs were obtained to evaluate the vascular anatomy.  CONTRAST: 100 mL Isovue 370  COMPARISON: 08/11/2014  FINDINGS: Technically adequate study with good opacification of the central and segmental pulmonary arteries. No focal filling defects are demonstrated. No evidence of significant pulmonary embolus.  Normal heart size. Normal caliber thoracic aorta without dissection. Great vessel origins are patent. No significant lymphadenopathy in the chest.  Respiratory motion artifact limits evaluation of lungs. There are multiple small nodular opacities demonstrated in the left lingula. The appearance is nonspecific but likely to represent inflammatory process such as bronchopneumonia. Mild bronchial wall thickening suggesting bronchitis. No pleural effusions. No pneumothorax.  Included portions of the upper abdomen demonstrate passive congestion of contrast material into the liver. No destructive bone lesions.  Review of the MIP images confirms the above findings.  IMPRESSION: No evidence of significant pulmonary embolus. Vague  nodular infiltrative pattern to the lingula may indicate pneumonia.   Electronically Signed By: Burman Nieves M.D. On: 02/06/2016 01:44          US Venous Img Lower Unilateral Right (Final result) Result time: 02/06/16 01:26:34   Final result by Rad Results In Interface (02/06/16 01:26:34)   Narrative:   CLINICAL DATA: Right leg pain for 2 weeks. Chest pain for 1 day. History of previous DVT.  EXAM: Right LOWER EXTREMITY VENOUS DOPPLER ULTRASOUND  TECHNIQUE: Gray-scale sonography with graded compression, as well as color Doppler and duplex ultrasound were performed to evaluate the lower extremity deep venous systems from the level of the common femoral vein and including the common femoral, femoral, profunda femoral, popliteal and calf veins including the posterior tibial, peroneal and gastrocnemius veins when visible. The superficial great saphenous vein was also interrogated. Spectral Doppler was utilized to evaluate flow at rest and with distal augmentation maneuvers in the common femoral, femoral and popliteal veins.  COMPARISON: 08/11/2014.  FINDINGS: Contralateral Common Femoral Vein: Respiratory phasicity is normal and symmetric with the symptomatic side. No evidence of thrombus. Normal compressibility.  Common Femoral Vein: No evidence of thrombus. Normal compressibility, respiratory phasicity and response to augmentation.  Saphenofemoral Junction: No evidence of thrombus. Normal compressibility and flow on color Doppler imaging.  Profunda Femoral Vein: No evidence of thrombus. Normal compressibility and flow on color Doppler imaging.  Femoral Vein: No evidence of thrombus. Normal compressibility, respiratory phasicity and response to augmentation.  Popliteal Vein: Popliteal vein demonstrates partial echogenic filling defect with partial compression and flow demonstrated. This likely represents eccentric chronic thrombus. No definite acute  DVT.  Calf Veins: No evidence of thrombus. Normal compressibility and flow on color Doppler imaging.  Superficial Great Saphenous Vein: No evidence of thrombus. Normal compressibility and flow on color Doppler imaging.  Venous Reflux: None.  Other Findings: None.  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 No new acute deep VT demonstrated.   Electronically Signed By: Burman NievesWilliam Stevens M.D. On: 02/06/2016 01:26      INITIAL IMPRESSION / ASSESSMENT AND PLAN / ED COURSE  Pertinent labs & imaging results that were available during my care of the patient were reviewed by me and considered in my medical decision making (see chart for details).  Patient prescribes her relatively low and Levaquin given CT scan findings of possible lingular pneumonia and chronic thrombus right popliteal area. Stressed importance of the patient's of the importance of taking his medications as prescribed in addition the patient will be referred to Dr. to Reesa ChewJohnsie for outpatient follow-up. I encouraged patient to return to the emergency Department if any worsening pain difficulty breathing or any other emergency medical concerns.  ____________________________________________   FINAL CLINICAL IMPRESSION(S) / ED DIAGNOSES  Final diagnoses:  DVT (deep venous thrombosis), right  Community acquired pneumonia      Darci Currentandolph N Kavya Haag, MD 02/06/16 2255

## 2016-02-06 NOTE — ED Notes (Signed)
Patient to ultrasound via stretcher with staff. 

## 2016-02-06 NOTE — Discharge Instructions (Signed)
Deep Vein Thrombosis °A deep vein thrombosis (DVT) is a blood clot (thrombus) that usually occurs in a deep, larger vein of the lower leg or the pelvis, or in an upper extremity such as the arm. These are dangerous and can lead to serious and even life-threatening complications if the clot travels to the lungs. °A DVT can damage the valves in your leg veins so that instead of flowing upward, the blood pools in the lower leg. This is called post-thrombotic syndrome, and it can result in pain, swelling, discoloration, and sores on the leg. °CAUSES °A DVT is caused by the formation of a blood clot in your leg, pelvis, or arm. Usually, several things contribute to the formation of blood clots. A clot may develop when: °· Your blood flow slows down. °· Your vein becomes damaged in some way. °· You have a condition that makes your blood clot more easily. °RISK FACTORS °A DVT is more likely to develop in: °· People who are older, especially over 60 years of age. °· People who are overweight (obese). °· People who sit or lie still for a long time, such as during long-distance travel (over 4 hours), bed rest, hospitalization, or during recovery from certain medical conditions like a stroke. °· People who do not engage in much physical activity (sedentary lifestyle). °· People who have chronic breathing disorders. °· People who have a personal or family history of blood clots or blood clotting disease. °· People who have peripheral vascular disease (PVD), diabetes, or some types of cancer. °· People who have heart disease, especially if the person had a recent heart attack or has congestive heart failure. °· People who have neurological diseases that affect the legs (leg paresis). °· People who have had a traumatic injury, such as breaking a hip or leg. °· People who have recently had major or lengthy surgery, especially on the hip, knee, or abdomen. °· People who have had a central line placed inside a large vein. °· People  who take medicines that contain the hormone estrogen. These include birth control pills and hormone replacement therapy. °· Pregnancy or during childbirth or the postpartum period. °· Long plane flights (over 8 hours). °SIGNS AND SYMPTOMS °Symptoms of a DVT can include:  °· Swelling of your leg or arm, especially if one side is much worse. °· Warmth and redness of your leg or arm, especially if one side is much worse. °· Pain in your arm or leg. If the clot is in your leg, symptoms may be more noticeable or worse when you stand or walk. °· A feeling of pins and needles, if the clot is in the arm. °The symptoms of a DVT that has traveled to the lungs (pulmonary embolism, PE) usually start suddenly and include: °· Shortness of breath while active or at rest. °· Coughing or coughing up blood or blood-tinged mucus. °· Chest pain that is often worse with deep breaths. °· Rapid or irregular heartbeat. °· Feeling light-headed or dizzy. °· Fainting. °· Feeling anxious. °· Sweating. °There may also be pain and swelling in a leg if that is where the blood clot started. °These symptoms may represent a serious problem that is an emergency. Do not wait to see if the symptoms will go away. Get medical help right away. Call your local emergency services (911 in the [place] Do not drive yourself to the hospital. °DIAGNOSIS °Your health care provider will take a medical history and perform a physical exam. You may also   have other tests, including:  Blood tests to assess the clotting properties of your blood.  Imaging tests, such as CT, ultrasound, MRI, X-ray, and other tests to see if you have clots anywhere in your body. TREATMENT After a DVT is identified, it can be treated. The type of treatment that you receive depends on many factors, such as the cause of your DVT, your risk for bleeding or developing more clots, and other medical conditions that you have. Sometimes, a combination of treatments is necessary. Treatment  options may be combined and include:  Monitoring the blood clot with ultrasound.  Taking medicines by mouth, such as newer blood thinners (anticoagulants), thrombolytics, or warfarin.  Taking anticoagulant medicine by injection or through an IV tube.  Wearing compression stockings or using different types ofdevices.  Surgery (rare) to remove the blood clot or to place a filter in your abdomen to stop the blood clot from traveling to your lungs. Treatments for a DVT are often divided into immediate treatment and long-term treatment (up to 3 months after DVT). You can work with your health care provider to choose the treatment program that is best for you. HOME CARE INSTRUCTIONS If you are taking a newer oral anticoagulant:  Take the medicine every single day at the same time each day.  Understand what foods and drugs interact with this medicine.  Understand that there are no regular blood tests required when using this medicine.  Understand the side effects of this medicine, including excessive bruising or bleeding. Ask your health care provider or pharmacist about other possible side effects. If you are taking warfarin:  Understand how to take warfarin and know which foods can affect how warfarin works in Veterinary surgeon.  Understand that it is dangerous to take too much or too little warfarin. Too much warfarin increases the risk of bleeding. Too little warfarin continues to allow the risk for blood clots.  Follow your PT and INR blood testing schedule. The PT and INR results allow your health care provider to adjust your dose of warfarin. It is very important that you have your PT and INR tested as often as told by your health care provider.  Avoid major changes in your diet, or tell your health care provider before you change your diet. Arrange a visit with a registered dietitian to answer your questions. Many foods, especially foods that are high in vitamin K, can interfere with warfarin  and affect the PT and INR results. Eat a consistent amount of foods that are high in vitamin K, such as:  Spinach, kale, broccoli, cabbage, collard greens, turnip greens, Brussels sprouts, peas, cauliflower, seaweed, and parsley.  Beef liver and pork liver.  Green tea.  Soybean oil.  Tell your health care provider about any and all medicines, vitamins, and supplements that you take, including aspirin and other over-the-counter anti-inflammatory medicines. Be especially cautious with aspirin and anti-inflammatory medicines. Do not take those before you ask your health care provider if it is safe to do so. This is important because many medicines can interfere with warfarin and affect the PT and INR results.  Do not start or stop taking any over-the-counter or prescription medicine unless your health care provider or pharmacist tells you to do so. If you take warfarin, you will also need to do these things:  Hold pressure over cuts for longer than usual.  Tell your dentist and other health care providers that you are taking warfarin before you have any procedures in which  bleeding may occur.  Avoid alcohol or drink very small amounts. Tell your health care provider if you change your alcohol intake.  Do not use tobacco products, including cigarettes, chewing tobacco, and e-cigarettes. If you need help quitting, ask your health care provider.  Avoid contact sports. General Instructions  Take over-the-counter and prescription medicines only as told by your health care provider. Anticoagulant medicines can have side effects, including easy bruising and difficulty stopping bleeding. If you are prescribed an anticoagulant, you will also need to do these things:  Hold pressure over cuts for longer than usual.  Tell your dentist and other health care providers that you are taking anticoagulants before you have any procedures in which bleeding may occur.  Avoid contact sports.  Wear a medical  alert bracelet or carry a medical alert card that says you have had a PE.  Ask your health care provider how soon you can go back to your normal activities. Stay active to prevent new blood clots from forming.  Make sure to exercise while traveling or when you have been sitting or standing for a long period of time. It is very important to exercise. Exercise your legs by walking or by tightening and relaxing your leg muscles often. Take frequent walks.  Wear compression stockings as told by your health care provider to help prevent more blood clots from forming.  Do not use tobacco products, including cigarettes, chewing tobacco, and e-cigarettes. If you need help quitting, ask your health care provider.  Keep all follow-up appointments with your health care provider. This is important. PREVENTION Take these actions to decrease your risk of developing another DVT:  Exercise regularly. For at least 30 minutes every day, engage in:  Activity that involves moving your arms and legs.  Activity that encourages good blood flow through your body by increasing your heart rate.  Exercise your arms and legs every hour during long-distance travel (over 4 hours). Drink plenty of water and avoid drinking alcohol while traveling.  Avoid sitting or lying in bed for long periods of time without moving your legs.  Maintain a weight that is appropriate for your height. Ask your health care provider what weight is healthy for you.  If you are a woman who is over 37 years of age, avoid unnecessary use of medicines that contain estrogen. These include birth control pills.  Do not smoke, especially if you take estrogen medicines. If you need help quitting, ask your health care provider. If you are hospitalized, prevention measures may include:  Early walking after surgery, as soon as your health care provider says that it is safe.  Receiving anticoagulants to prevent blood clots.If you cannot take  anticoagulants, other options may be available, such as wearing compression stockings or using different types of devices. SEEK IMMEDIATE MEDICAL CARE IF:  You have new or increased pain, swelling, or redness in an arm or leg.  You have numbness or tingling in an arm or leg.  You have shortness of breath while active or at rest.  You have chest pain.  You have a rapid or irregular heartbeat.  You feel light-headed or dizzy.  You cough up blood.  You notice blood in your vomit, bowel movement, or urine. These symptoms may represent a serious problem that is an emergency. Do not wait to see if the symptoms will go away. Get medical help right away. Call your local emergency services (911 in the [place] Do not drive yourself to the hospital.  This information is not intended to replace advice given to you by your health care provider. Make sure you discuss any questions you have with your health care provider.   Document Released: 09/22/2005 Document Revised: 06/13/2015 Document Reviewed: 01/17/2015 Elsevier Interactive Patient Education 2016 [employer]  Community-Acquired Pneumonia, Adult Pneumonia is an infection of the lungs. There are different types of pneumonia. One type can develop while a person is in a hospital. A different type, called community-acquired pneumonia, develops in people who are not, or have not recently been, in the hospital or other health care facility.  CAUSES Pneumonia may be caused by bacteria, viruses, or funguses. Community-acquired pneumonia is often caused by Streptococcus pneumonia bacteria. These bacteria are often passed from one person to another by breathing in droplets from the cough or sneeze of an infected person. RISK FACTORS The condition is more likely to develop in:  People who havechronic diseases, such as chronic obstructive pulmonary disease (COPD), asthma, congestive heart failure, cystic fibrosis, diabetes, or kidney  disease.  People who haveearly-stage or late-stage HIV.  People who havesickle cell disease.  People who havehad their spleen removed (splenectomy).  People who havepoor Administrator.  People who havemedical conditions that increase the risk of breathing in (aspirating) secretions their own mouth and nose.   People who havea weakened immune system (immunocompromised).  People who smoke.  People whotravel to areas where pneumonia-causing germs commonly exist.  People whoare around animal habitats or animals that have pneumonia-causing germs, including birds, bats, rabbits, cats, and farm animals. SYMPTOMS Symptoms of this condition include:  Adry cough.  A wet (productive) cough.  Fever.  Sweating.  Chest pain, especially when breathing deeply or coughing.  Rapid breathing or difficulty breathing.  Shortness of breath.  Shaking chills.  Fatigue.  Muscle aches. DIAGNOSIS Your health care provider will take a medical history and perform a physical exam. You may also have other tests, including:  Imaging studies of your chest, including X-rays.  Tests to check your blood oxygen level and other blood gases.  Other tests on blood, mucus (sputum), fluid around your lungs (pleural fluid), and urine. If your pneumonia is severe, other tests may be done to identify the specific cause of your illness. TREATMENT The type of treatment that you receive depends on many factors, such as the cause of your pneumonia, the medicines you take, and other medical conditions that you have. For most adults, treatment and recovery from pneumonia may occur at home. In some cases, treatment must happen in a hospital. Treatment may include:  Antibiotic medicines, if the pneumonia was caused by bacteria.  Antiviral medicines, if the pneumonia was caused by a virus.  Medicines that are given by mouth or through an IV tube.  Oxygen.  Respiratory therapy. Although rare,  treating severe pneumonia may include:  Mechanical ventilation. This is done if you are not breathing well on your own and you cannot maintain a safe blood oxygen level.  Thoracentesis. This procedureremoves fluid around one lung or both lungs to help you breathe better. HOME CARE INSTRUCTIONS  Take over-the-counter and prescription medicines only as told by your health care provider.  Only takecough medicine if you are losing sleep. Understand that cough medicine can prevent your body's natural ability to remove mucus from your lungs.  If you were prescribed an antibiotic medicine, take it as told by your health care provider. Do not stop taking the antibiotic even if you start to feel better.  Sleep in  a semi-upright position at night. Try sleeping in a reclining chair, or place a few pillows under your head.  Do not use tobacco products, including cigarettes, chewing tobacco, and e-cigarettes. If you need help quitting, ask your health care provider.  Drink enough water to keep your urine clear or pale yellow. This will help to thin out mucus secretions in your lungs. PREVENTION There are ways that you can decrease your risk of developing community-acquired pneumonia. Consider getting a pneumococcal vaccine if:  You are older than 33 years of age.  You are older than 33 years of age and are undergoing cancer treatment, have chronic lung disease, or have other medical conditions that affect your immune system. Ask your health care provider if this applies to you. There are different types and schedules of pneumococcal vaccines. Ask your health care provider which vaccination option is best for you. You may also prevent community-acquired pneumonia if you take these actions:  Get an influenza vaccine every year. Ask your health care provider which type of influenza vaccine is best for you.  Go to the dentist on a regular basis.  Wash your hands often. Use hand sanitizer if soap and  water are not available. SEEK MEDICAL CARE IF:  You have a fever.  You are losing sleep because you cannot control your cough with cough medicine. SEEK IMMEDIATE MEDICAL CARE IF:  You have worsening shortness of breath.  You have increased chest pain.  Your sickness becomes worse, especially if you are an older adult or have a weakened immune system.  You cough up blood.   This information is not intended to replace advice given to you by your health care provider. Make sure you discuss any questions you have with your health care provider.   Document Released: 09/22/2005 Document Revised: 06/13/2015 Document Reviewed: 01/17/2015 Elsevier Interactive Patient Education [employer]

## 2016-06-08 ENCOUNTER — Emergency Department: Payer: Self-pay

## 2016-06-08 ENCOUNTER — Emergency Department
Admission: EM | Admit: 2016-06-08 | Discharge: 2016-06-08 | Disposition: A | Discharge status: Home / Self Care | Payer: Self-pay | Attending: Emergency Medicine | Admitting: Emergency Medicine

## 2016-06-08 ENCOUNTER — Encounter: Payer: Self-pay | Admitting: Emergency Medicine

## 2016-06-08 DIAGNOSIS — F17200 Nicotine dependence, unspecified, uncomplicated: Secondary | ICD-10-CM | POA: Insufficient documentation

## 2016-06-08 DIAGNOSIS — R51 Headache: Secondary | ICD-10-CM | POA: Insufficient documentation

## 2016-06-08 DIAGNOSIS — R519 Headache, unspecified: Secondary | ICD-10-CM

## 2016-06-08 HISTORY — DX: Acute embolism and thrombosis of unspecified deep veins of unspecified lower extremity: I82.409

## 2016-06-08 HISTORY — DX: Other pulmonary embolism without acute cor pulmonale: I26.99

## 2016-06-08 MED ORDER — BUTALBITAL-APAP-CAFFEINE 50-325-40 MG PO TABS
1.0000 | ORAL_TABLET | ORAL | Status: AC
  Administered 2016-06-08: 1 via ORAL
  Filled 2016-06-08: qty 1

## 2016-06-08 MED ORDER — BUTALBITAL-APAP-CAFFEINE 50-325-40 MG PO TABS: 1.0000 | ORAL_TABLET | Freq: Four times a day (QID) | ORAL | 0 refills | Status: AC | PRN

## 2016-06-08 NOTE — ED Triage Notes (Signed)
Pt reports HA that started 3 weeks ago. Pt describes as throbbing at times. Pt denies hx of migraines and denies any other sxs along with the HA with the exception of some light sensitivity at times. Pt is alert and oriented x4. No distress at this time. Ambulatory in triage

## 2016-06-08 NOTE — Discharge Instructions (Signed)
You have been seen in the Emergency Department (ED) for a headache.  Please use Tylenol or Motrin as needed for symptoms, but only as written on the box.  We also gave you a prescription that may help.  Your head CT and vital signs and physical exam were all reassuring today, and it does not appear that you have an emergent medical condition at this time.  As we have discussed, please follow up with Dr. [name] regarding your anticoagulation medication.  It is very important that you be followed by the doctor who prescribes these medications, and you should schedule the next available follow-up appointment.  Call your doctor or return to the ED if you have a worsening headache, sudden and severe headache, confusion, slurred speech, facial droop, weakness or numbness in any arm or leg, extreme fatigue, vision problems, or other symptoms that concern you.

## 2016-06-08 NOTE — ED Triage Notes (Signed)
Pt reports headache that started 3 weeks ago. Unrelieved by OTC meds.

## 2016-06-08 NOTE — ED Triage Notes (Signed)
Pt has not had PT/INR checked for several months.
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
## 2019-08-16 ENCOUNTER — Other Ambulatory Visit: Payer: Self-pay

## 2019-08-16 DIAGNOSIS — Z20822 Contact with and (suspected) exposure to covid-19: Secondary | ICD-10-CM

## 2019-08-18 LAB — NOVEL CORONAVIRUS, NAA: SARS-CoV-2, NAA: NOT DETECTED

## 2019-11-02 ENCOUNTER — Other Ambulatory Visit: Payer: Self-pay

## 2019-11-02 ENCOUNTER — Emergency Department
Admission: EM | Admit: 2019-11-02 | Discharge: 2019-11-02 | Disposition: A | Discharge status: Home / Self Care | Payer: Self-pay | Attending: Emergency Medicine | Admitting: Emergency Medicine

## 2019-11-02 DIAGNOSIS — F17210 Nicotine dependence, cigarettes, uncomplicated: Secondary | ICD-10-CM | POA: Insufficient documentation

## 2019-11-02 DIAGNOSIS — M79642 Pain in left hand: Secondary | ICD-10-CM | POA: Insufficient documentation

## 2019-11-02 DIAGNOSIS — Z7901 Long term (current) use of anticoagulants: Secondary | ICD-10-CM | POA: Insufficient documentation

## 2019-11-02 DIAGNOSIS — Z86711 Personal history of pulmonary embolism: Secondary | ICD-10-CM | POA: Insufficient documentation

## 2019-11-02 DIAGNOSIS — M79641 Pain in right hand: Secondary | ICD-10-CM | POA: Insufficient documentation

## 2019-11-02 DIAGNOSIS — R200 Anesthesia of skin: Secondary | ICD-10-CM | POA: Insufficient documentation

## 2019-11-02 DIAGNOSIS — R202 Paresthesia of skin: Secondary | ICD-10-CM | POA: Insufficient documentation

## 2019-11-02 DIAGNOSIS — Z86718 Personal history of other venous thrombosis and embolism: Secondary | ICD-10-CM | POA: Insufficient documentation

## 2019-11-02 MED ORDER — KETOROLAC TROMETHAMINE 30 MG/ML IJ SOLN
30.0000 mg | Freq: Once | INTRAMUSCULAR | Status: AC
  Administered 2019-11-02: 11:00:00 30 mg via INTRAMUSCULAR
  Filled 2019-11-02: qty 1

## 2019-11-02 NOTE — Discharge Instructions (Addendum)
I suspect that your pain is coming from using your hands a lot at work.  You may also be starting to get some carpal tunnel syndrome.  You can try Velcro wrist splints to see if this helps your symptoms.  Please call primary care or hand orthopedics to follow-up.

## 2019-11-02 NOTE — ED Provider Notes (Signed)
[facility] Emergency Department Provider Note  ____________________________________________  Time seen: Approximately 10:26 AM  I have reviewed the triage vital signs and the nursing notes.   HISTORY  Chief Complaint Hand Pain    HPI Dalton Pena is a 39 y.o. male that presents to the emergency department for evaluation of bilateral hand pain, numbness, and tingling for 2 days. Pain is worse with gripping. 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 He has been in this job for 1.5 months.No specific trauma. Patient does not play video games. No forearm or upper arm pain or swelling. No headache, SOB, CP, abdominal pain.   Past Medical History:  Diagnosis Date  . Deep vein thrombosis (DVT) ([place])   . Pulmonary embolism (HCC)     There are no problems to display for this patient.   History reviewed. No pertinent surgical history.  Prior to Admission medications   Medication Sig Start Date End Date Taking? Authorizing Provider  acetaminophen-codeine (TYLENOL #3) 300-30 MG tablet Take 1-2 tablets by mouth every 4 (four) hours as needed for moderate pain. Patient not taking: Reported on 02/06/2016 01/08/16   Sherrie George B, FNP  lidocaine (XYLOCAINE) 2 % solution Use as directed 20 mLs in the mouth or throat as needed for mouth pain. Patient not taking: Reported on 02/06/2016 01/09/16   Beers, Pierce Crane, PA-C  Rivaroxaban (XARELTO) 15 MG TABS tablet Take 1 tablet (15 mg total) by mouth daily with supper. 02/06/16   Gregor Hams, MD    Allergies Patient has no known allergies.  No family history on file.  Social History Social History   Tobacco Use  . Smoking status: Current Every Day Smoker  . Smokeless tobacco: Never Used  Substance Use Topics  . Alcohol use: No  . Drug use: Not on file     Review of Systems  Gastrointestinal:  No nausea, no vomiting.  Musculoskeletal: Positive for bilateral hand pain. Skin: Negative for  rash, abrasions, lacerations, ecchymosis. Neurological: Positive for bilateral hand numbness or tingling   ____________________________________________   PHYSICAL EXAM:  VITAL SIGNS: ED Triage Vitals  Enc Vitals Group     BP 11/02/19 1001 135/78     Pulse Rate 11/02/19 1001 67     Resp 11/02/19 1001 16     Temp 11/02/19 1001 98.2 F (36.8 C)     Temp Source 11/02/19 1001 Oral     SpO2 11/02/19 1001 98 %     Weight 11/02/19 1002 270 lb (122.5 kg)     Height 11/02/19 1002 5\' 5"  (1.651 m)     Head Circumference --      Peak Flow --      Pain Score 11/02/19 1002 8     Pain Loc --      Pain Edu? --      Excl. in [place]? --      Constitutional: Alert and oriented. Well appearing and in no acute distress. Eyes: Conjunctivae are normal. PERRL. EOMI. Head: Atraumatic. ENT:      Ears:      Nose: No congestion/rhinnorhea.      Mouth/Throat: Mucous membranes are moist.  Neck: No stridor. Cardiovascular: Normal rate, regular rhythm.  Good peripheral circulation. Respiratory: Normal respiratory effort without tachypnea or retractions. Lungs CTAB. Good air entry to the bases with no decreased or absent breath sounds. Musculoskeletal: Full range of motion to all extremities. No gross deformities appreciated.  Strength equal in upper extremities  bilaterally.  No visible swelling.  Positive Tinel's and Phalen's. Neurologic:  Normal speech and language. No gross focal neurologic deficits are appreciated.  Skin:  Skin is warm, dry and intact. No rash noted. Psychiatric: Mood and affect are normal. Speech and behavior are normal. Patient exhibits appropriate insight and judgement.   ____________________________________________   LABS (all labs ordered are listed, but only abnormal results are displayed)  Labs Reviewed - No data to display ____________________________________________  EKG   ____________________________________________  RADIOLOGY   No results  found.  ____________________________________________    PROCEDURES  Procedure(s) performed:    Procedures    Medications  ketorolac (TORADOL) 30 MG/ML injection 30 mg (30 mg Intramuscular Given 11/02/19 1040)     ____________________________________________   INITIAL IMPRESSION / ASSESSMENT AND PLAN / ED COURSE  Pertinent labs & imaging results that were available during my care of the patient were reviewed by me and considered in my medical decision making (see chart for details).  Review of the [place] CSRS was performed in accordance of the [facility] prior to dispensing any controlled drugs.     Patient presented to the emergency department for evaluation of bilateral hand pain.  Vital signs and exam are reassuring.  Symptoms most consistent with overuse at work.  Patient may be starting to develop some carpal tunnel as well.  Bilateral Velcro splints were given.  Patient will take Motrin.  Patient is to follow up with primary care and hand orthopedics as directed. Patient is given ED precautions to return to the ED for any worsening or new symptoms.   Dalton Pena was evaluated in Emergency Department on 11/02/2019 for the symptoms described in the history of present illness. He was evaluated in the context of the global COVID-19 pandemic, which necessitated consideration that the patient might be at risk for infection with the SARS-CoV-2 virus that causes COVID-19. Institutional protocols and algorithms that pertain to the evaluation of patients at risk for COVID-19 are in a state of rapid change based on information released by regulatory bodies including the CDC and federal and state organizations. These policies and algorithms were followed during the patient's care in the ED.  ____________________________________________  FINAL CLINICAL IMPRESSION(S) / ED DIAGNOSES  Final diagnoses:  Bilateral hand pain      NEW MEDICATIONS STARTED DURING THIS VISIT:  ED Discharge  Orders    None          This chart was dictated using voice recognition software/Dragon. Despite best efforts to proofread, errors can occur which can change the meaning. Any change was purely unintentional.    Enid Derry, PA-C 11/02/19 1745    Shaune Pollack, MD 11/03/19 2155

## 2019-11-02 NOTE — ED Triage Notes (Signed)
Pt c/o Bl hand cramping for the past 2 days, states he does a lot of repetitive work. Cap refill <3. Redial pulses strong,.

## 2019-12-15 ENCOUNTER — Emergency Department
Admission: EM | Admit: 2019-12-15 | Discharge: 2019-12-15 | Disposition: A | Discharge status: Home / Self Care | Payer: Self-pay | Attending: Emergency Medicine | Admitting: Emergency Medicine

## 2019-12-15 ENCOUNTER — Other Ambulatory Visit: Payer: Self-pay

## 2019-12-15 ENCOUNTER — Encounter: Payer: Self-pay | Admitting: Emergency Medicine

## 2019-12-15 DIAGNOSIS — Z5321 Procedure and treatment not carried out due to patient leaving prior to being seen by health care provider: Secondary | ICD-10-CM | POA: Insufficient documentation

## 2019-12-15 DIAGNOSIS — R200 Anesthesia of skin: Secondary | ICD-10-CM | POA: Insufficient documentation

## 2019-12-15 NOTE — ED Notes (Signed)
This nurse saw the patient walk by while going to assist another patient and shortly after went to look for patient (around 9:55 pm). This nurse went to lobby and looked for patient and did not find him. Pt not in bathroom or in room. This nurse waited for patient to come back to room, but patient has not returned.  This nurse attempted to call the patient on the phone number listed in chart but was not able to reach him.    Pt did not verbalize that he was going to leave or was upset when this nurse assessed him a few minutes prior. Patient was pleasant on assessment and did not verbalize being upset due to wait.

## 2019-12-15 NOTE — ED Triage Notes (Signed)
Per pt bilateral fingertip numbness in which he was dx carpal tunnel. Did not follow up with ortho.

## 2020-03-14 ENCOUNTER — Encounter: Payer: Self-pay | Admitting: *Deleted

## 2020-03-14 ENCOUNTER — Other Ambulatory Visit: Payer: Self-pay

## 2020-03-14 ENCOUNTER — Emergency Department: Payer: Self-pay

## 2020-03-14 DIAGNOSIS — Z7901 Long term (current) use of anticoagulants: Secondary | ICD-10-CM

## 2020-03-14 DIAGNOSIS — Z86711 Personal history of pulmonary embolism: Secondary | ICD-10-CM

## 2020-03-14 DIAGNOSIS — Z20822 Contact with and (suspected) exposure to covid-19: Secondary | ICD-10-CM | POA: Diagnosis present

## 2020-03-14 DIAGNOSIS — K529 Noninfective gastroenteritis and colitis, unspecified: Secondary | ICD-10-CM | POA: Diagnosis present

## 2020-03-14 DIAGNOSIS — Z86718 Personal history of other venous thrombosis and embolism: Secondary | ICD-10-CM

## 2020-03-14 DIAGNOSIS — I2699 Other pulmonary embolism without acute cor pulmonale: Principal | ICD-10-CM | POA: Diagnosis present

## 2020-03-14 DIAGNOSIS — I82411 Acute embolism and thrombosis of right femoral vein: Secondary | ICD-10-CM | POA: Diagnosis present

## 2020-03-14 DIAGNOSIS — F17200 Nicotine dependence, unspecified, uncomplicated: Secondary | ICD-10-CM | POA: Diagnosis present

## 2020-03-14 DIAGNOSIS — Z6841 Body Mass Index (BMI) 40.0 and over, adult: Secondary | ICD-10-CM

## 2020-03-14 DIAGNOSIS — I82431 Acute embolism and thrombosis of right popliteal vein: Secondary | ICD-10-CM | POA: Diagnosis present

## 2020-03-14 LAB — COMPREHENSIVE METABOLIC PANEL
ALT: 16 U/L (ref 0–44)
AST: 14 U/L — ABNORMAL LOW (ref 15–41)
Albumin: 3.4 g/dL — ABNORMAL LOW (ref 3.5–5.0)
Alkaline Phosphatase: 44 U/L (ref 38–126)
Anion gap: 6 (ref 5–15)
BUN: 12 mg/dL (ref 6–20)
CO2: 27 mmol/L (ref 22–32)
Calcium: 8.2 mg/dL — ABNORMAL LOW (ref 8.9–10.3)
Chloride: 104 mmol/L (ref 98–111)
Creatinine, Ser: 1.31 mg/dL — ABNORMAL HIGH (ref 0.61–1.24)
GFR calc Af Amer: >60 mL/min (ref >60)
GFR calc non Af Amer: >60 mL/min (ref >60)
Glucose, Bld: 99 mg/dL (ref 70–99)
Potassium: 3.8 mmol/L (ref 3.5–5.1)
Sodium: 137 mmol/L (ref 135–145)
Total Bilirubin: 0.9 mg/dL (ref 0.3–1.2)
Total Protein: 6.7 g/dL (ref 6.5–8.1)

## 2020-03-14 LAB — CBC
HCT: 43 % (ref 39.0–52.0)
Hemoglobin: 14.5 g/dL (ref 13.0–17.0)
MCH: 32.6 pg (ref 26.0–34.0)
MCHC: 33.7 g/dL (ref 30.0–36.0)
MCV: 96.6 fL (ref 80.0–100.0)
Platelets: 275 10*3/uL (ref 150–400)
RBC: 4.45 MIL/uL (ref 4.22–5.81)
RDW: 13.2 % (ref 11.5–15.5)
WBC: 10.7 10*3/uL — ABNORMAL HIGH (ref 4.0–10.5)
nRBC: 0 % (ref 0.0–0.2)

## 2020-03-14 LAB — URINALYSIS, COMPLETE (UACMP) WITH MICROSCOPIC
Bacteria, UA: NONE SEEN
Bilirubin Urine: NEGATIVE
Glucose, UA: NEGATIVE mg/dL
Hgb urine dipstick: NEGATIVE
Ketones, ur: NEGATIVE mg/dL
Leukocytes,Ua: NEGATIVE
Nitrite: NEGATIVE
Protein, ur: NEGATIVE mg/dL
Specific Gravity, Urine: 1.028 (ref 1.005–1.030)
pH: 5 (ref 5.0–8.0)

## 2020-03-14 LAB — LIPASE, BLOOD: Lipase: 19 U/L (ref 11–51)

## 2020-03-14 MED ORDER — SODIUM CHLORIDE 0.9% FLUSH: 3.0000 mL | Freq: Once | INTRAVENOUS | Status: DC

## 2020-03-14 NOTE — ED Triage Notes (Signed)
Pt ambulatory to triage.  Pt reports vomiting and diarrhea today with abd pain.  No appetite.  Vomiting x 4 today. Pt has cough.  No chest pain or sob.   Pt alert  Speech clear.

## 2020-03-15 ENCOUNTER — Emergency Department: Payer: Self-pay

## 2020-03-15 ENCOUNTER — Inpatient Hospital Stay
Admission: EM | Admit: 2020-03-15 | Discharge: 2020-03-15 | DRG: 176 | Discharge status: Against Medical Advice | Payer: Self-pay | Attending: Internal Medicine | Admitting: Internal Medicine

## 2020-03-15 ENCOUNTER — Other Ambulatory Visit: Payer: Self-pay

## 2020-03-15 DIAGNOSIS — I82461 Acute embolism and thrombosis of right calf muscular vein: Secondary | ICD-10-CM

## 2020-03-15 DIAGNOSIS — K529 Noninfective gastroenteritis and colitis, unspecified: Secondary | ICD-10-CM

## 2020-03-15 DIAGNOSIS — E66813 Obesity, class 3: Secondary | ICD-10-CM

## 2020-03-15 DIAGNOSIS — I82411 Acute embolism and thrombosis of right femoral vein: Secondary | ICD-10-CM

## 2020-03-15 DIAGNOSIS — I2699 Other pulmonary embolism without acute cor pulmonale: Secondary | ICD-10-CM | POA: Diagnosis present

## 2020-03-15 LAB — BRAIN NATRIURETIC PEPTIDE: B Natriuretic Peptide: 28.9 pg/mL (ref 0.0–100.0)

## 2020-03-15 LAB — SARS CORONAVIRUS 2 BY RT PCR (HOSPITAL ORDER, PERFORMED IN ~~LOC~~ HOSPITAL LAB): SARS Coronavirus 2: NEGATIVE

## 2020-03-15 LAB — PROTIME-INR
INR: 1.1 (ref 0.8–1.2)
Prothrombin Time: 13.8 seconds (ref 11.4–15.2)

## 2020-03-15 LAB — HIV ANTIBODY (ROUTINE TESTING W REFLEX): HIV Screen 4th Generation wRfx: NONREACTIVE

## 2020-03-15 LAB — APTT: aPTT: 24 seconds (ref 24–36)

## 2020-03-15 LAB — ANTITHROMBIN III: AntiThromb III Func: 84 % (ref 75–120)

## 2020-03-15 LAB — HEPARIN LEVEL (UNFRACTIONATED): Heparin Unfractionated: 0.37 IU/mL (ref 0.30–0.70)

## 2020-03-15 MED ORDER — ACETAMINOPHEN 650 MG RE SUPP: 650.0000 mg | Freq: Four times a day (QID) | RECTAL | Status: DC | PRN

## 2020-03-15 MED ORDER — ONDANSETRON HCL 4 MG/2ML IJ SOLN: 4.0000 mg | Freq: Four times a day (QID) | INTRAMUSCULAR | Status: DC | PRN

## 2020-03-15 MED ORDER — MORPHINE SULFATE (PF) 2 MG/ML IV SOLN: 2.0000 mg | INTRAVENOUS | Status: DC | PRN

## 2020-03-15 MED ORDER — ONDANSETRON HCL 4 MG PO TABS: 4.0000 mg | ORAL_TABLET | Freq: Four times a day (QID) | ORAL | Status: DC | PRN

## 2020-03-15 MED ORDER — ONDANSETRON HCL 4 MG/2ML IJ SOLN
4.0000 mg | Freq: Once | INTRAMUSCULAR | Status: AC
  Administered 2020-03-15: 4 mg via INTRAVENOUS
  Filled 2020-03-15: qty 2

## 2020-03-15 MED ORDER — ENOXAPARIN SODIUM 150 MG/ML ~~LOC~~ SOLN
1.0000 mg/kg | Freq: Once | SUBCUTANEOUS | Status: DC
  Filled 2020-03-15: qty 0.83

## 2020-03-15 MED ORDER — IOHEXOL 350 MG/ML SOLN
75.0000 mL | Freq: Once | INTRAVENOUS | Status: AC | PRN
  Administered 2020-03-15: 75 mL via INTRAVENOUS

## 2020-03-15 MED ORDER — IOHEXOL 300 MG/ML  SOLN
125.0000 mL | Freq: Once | INTRAMUSCULAR | Status: AC | PRN
  Administered 2020-03-15: 125 mL via INTRAVENOUS

## 2020-03-15 MED ORDER — RIVAROXABAN (XARELTO) VTE STARTER PACK (15 & 20 MG)
ORAL_TABLET | ORAL | 0 refills | Status: DC
Start: 2020-03-15 — End: 2021-01-22

## 2020-03-15 MED ORDER — SODIUM CHLORIDE 0.9 % IV BOLUS
1000.0000 mL | Freq: Once | INTRAVENOUS | Status: AC
  Administered 2020-03-15: 1000 mL via INTRAVENOUS

## 2020-03-15 MED ORDER — HYDROCODONE-ACETAMINOPHEN 5-325 MG PO TABS: 1.0000 | ORAL_TABLET | ORAL | Status: DC | PRN

## 2020-03-15 MED ORDER — HEPARIN SODIUM (PORCINE) 5000 UNIT/ML IJ SOLN: 4000.0000 [IU] | Freq: Once | INTRAMUSCULAR | Status: DC

## 2020-03-15 MED ORDER — HEPARIN BOLUS VIA INFUSION
5000.0000 [IU] | Freq: Once | INTRAVENOUS | Status: AC
  Administered 2020-03-15: 5000 [IU] via INTRAVENOUS
  Filled 2020-03-15: qty 5000

## 2020-03-15 MED ORDER — HEPARIN (PORCINE) 25000 UT/250ML-% IV SOLN
1450.0000 [IU]/h | INTRAVENOUS | Status: DC
  Administered 2020-03-15: 1450 [IU]/h via INTRAVENOUS
  Filled 2020-03-15: qty 250

## 2020-03-15 MED ORDER — ACETAMINOPHEN 325 MG PO TABS: 650.0000 mg | ORAL_TABLET | Freq: Four times a day (QID) | ORAL | Status: DC | PRN

## 2020-03-15 NOTE — ED Notes (Signed)
Pt O2 saturations noted fluctuating from 92 to 77% with good waveform when sleeping. This RN observed pt laying on side, snoring with mouth open with periods of apnea lasting up to 13 seconds. This RN attempted to place pt on nasal cannula. Pt refused stating "Uh uh I don't want that, it feels funny in my nose. But thank you though."

## 2020-03-15 NOTE — Progress Notes (Signed)
ANTICOAGULATION CONSULT NOTE - Initial Consult  Pharmacy Consult for Heparin Indication: pulmonary embolus  No Known Allergies  Patient Measurements: Height: 5\' 5"  (165.1 cm) Weight: 123.5 kg (272 lb 3.2 oz) IBW/kg (Calculated) : 61.5 HEPARIN DW (KG): 90.9  Vital Signs: Temp: 98.8 F (37.1 C) (06/10 1045) Temp Source: Oral (06/10 1045) BP: 131/79 (06/10 1045) Pulse Rate: 56 (06/10 1045)  Labs: Recent Labs    03/14/20 1945 03/15/20 0357 03/15/20 0624 03/15/20 1210  HGB 14.5  --   --   --   HCT 43.0  --   --   --   PLT 275  --   --   --   APTT  --  24  --   --   LABPROT  --   --  13.8  --   INR  --   --  1.1  --   HEPARINUNFRC  --   --   --  0.37  CREATININE 1.31*  --   --   --     Estimated Creatinine Clearance: 94.2 mL/min (A) (by C-G formula based on SCr of 1.31 mg/dL (H)).   Medical History: Past Medical History:  Diagnosis Date  . Deep vein thrombosis (DVT) (HCC)   . Pulmonary embolism (HCC)     Medications:  No medications prior to admission.    Assessment: Baseline labs ordered.  Asked to initiate Heparin for PE.  No anticoagulants per PTA med list.  6/10 1210 HL 0.37   Goal of Therapy:  Heparin level 0.3-0.7 units/ml Monitor platelets by anticoagulation protocol: Yes   Plan:  Heparin level is therapeutic. Will continue heparin infusion at 1450 units/hr Check Heparin level 6 hours. CBC daily with AM labs.   05/15/20, PharmD, BCPS 03/15/2020,12:53 PM

## 2020-03-15 NOTE — Discharge Summary (Addendum)
[facility] [place] at [facility]   PATIENT NAME: Dalton Pena    MR#:  [id number]  DATE OF BIRTH:  11-10-1980  DATE OF ADMISSION:  03/15/2020 ADMITTING PHYSICIAN: Alford Highland, MD  DATE OF signing out AGAINST MEDICAL ADVICE: 03/15/2020  5:15 PM  PRIMARY CARE PHYSICIAN: Patient, No Pcp Per    ADMISSION DIAGNOSIS:  Colitis [K52.9] Acute pulmonary embolism (HCC) [I26.99] Other acute pulmonary embolism without acute cor pulmonale (HCC) [I26.99] Acute deep vein thrombosis (DVT) of calf muscle vein of right lower extremity (HCC) [I82.461] Pulmonary embolism (HCC) [I26.99]  DISCHARGE DIAGNOSIS:  Active Problems:   Acute deep vein thrombosis (DVT) of right femoral vein (HCC)   Recurrent pulmonary emboli (HCC)   Obesity, Class III, BMI 40-49.9 (morbid obesity) (HCC)   Acute gastroenteritis   Acute pulmonary embolism (HCC)   Pulmonary embolism (HCC)   Acute deep vein thrombosis (DVT) of calf muscle vein of right lower extremity (HCC)   SECONDARY DIAGNOSIS:   Past Medical History:  Diagnosis Date  . Deep vein thrombosis (DVT) (HCC)   . Pulmonary embolism [facility])     HOSPITAL COURSE:   1.  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  Hypercoagulable work-up sent off and still pending at this point.  Patient was on heparin drip here in the hospital.  The patient stated he wanted to go to [facility] and he decided to leave.  I wanted to try to give him a blood thinner before he left and he refused.  I stated that he can die if he does not take a blood thinner.  Patient understands the risk and signed out AMA anyway.  I did prescribe Xarelto starter pack into his pharmacy and nurse gave a 30-day free card. 2.  Gastroenteritis.  Patient on a diet.  Stool studies ordered. 3.  Obesity with a BMI of 45.3.  DISCHARGE CONDITIONS:   Patient left AGAINST MEDICAL ADVICE with DVT and pulmonary embolism and risk of death explained.  CONSULTS OBTAINED:   None  DRUG ALLERGIES:  No Known Allergies  DISCHARGE MEDICATIONS:   I did E-prescribe Xarelto starter pack into his pharmacy and asked nurse to give a 30-day free card.  DISCHARGE INSTRUCTIONS:   Patient advised that he can die if he does not take the blood thinner.    Today   CHIEF COMPLAINT:   Chief Complaint  Patient presents with  . Abdominal Pain    HISTORY OF PRESENT ILLNESS:  Dalton Pena  is a 39 y.o. male came in with abdominal pain   VITAL SIGNS:  Blood pressure 131/79, pulse (!) 56, temperature 98.8 F (37.1 C), temperature source Oral, resp. rate 20, height 5\' 5"  (1.651 m), weight 123.5 kg, SpO2 98 %.  I/O:    Intake/Output Summary (Last 24 hours) at 03/15/2020 1734 Last data filed at 03/15/2020 1500 Gross per 24 hour  Intake 1625.91 ml  Output 0 ml  Net 1625.91 ml     DATA REVIEW:   CBC Recent Labs  Lab 03/14/20 1945  WBC 10.7*  HGB 14.5  HCT 43.0  PLT 275    Chemistries  Recent Labs  Lab 03/14/20 1945  NA 137  K 3.8  CL 104  CO2 27  GLUCOSE 99  BUN 12  CREATININE 1.31*  CALCIUM 8.2*  AST 14*  ALT 16  ALKPHOS 44  BILITOT 0.9    Microbiology Results  Results for orders placed or performed during the hospital encounter of 03/15/20  SARS Coronavirus 2 by RT PCR (hospital order, performed in [facility] hospital lab) Nasopharyngeal Nasopharyngeal Swab     Status: None   Collection Time: 03/15/20  3:47 AM   Specimen: Nasopharyngeal Swab  Result Value Ref Range Status   SARS Coronavirus 2 NEGATIVE NEGATIVE Final    Comment: (NOTE) SARS-CoV-2 target nucleic acids are NOT DETECTED.  The SARS-CoV-2 RNA is generally detectable in upper and lower respiratory specimens during the acute phase of infection. The lowest concentration of SARS-CoV-2 viral copies this assay can detect is 250 copies / mL. A negative result does not preclude SARS-CoV-2 infection and should not be used as the sole basis for treatment or other patient  management decisions.  A negative result may occur with improper specimen collection / handling, submission of specimen other than nasopharyngeal swab, presence of viral mutation(s) within the areas targeted by this assay, and inadequate number of viral copies (<250 copies / mL). A negative result must be combined with clinical observations, patient history, and epidemiological information.  Fact Sheet for Patients:   BoilerBrush.com.cy  Fact Sheet for Healthcare Providers: https://pope.com/  This test is not yet approved or  cleared by the [place] FDA and has been authorized for detection and/or diagnosis of SARS-CoV-2 by FDA under an Emergency Use Authorization (EUA).  This EUA will remain in effect (meaning this test can be used) for the duration of the COVID-19 declaration under Section 564(b)(1) of the Act, 21 U.S.C. section 360bbb-3(b)(1), unless the authorization is terminated or revoked sooner.  Performed at [facility], [address] [place], [place] [address]     RADIOLOGY:  DG Chest 2 View  Result Date: 03/14/2020 CLINICAL DATA:  39 year old male with cough, fever, vomiting. EXAM: CHEST - 2 VIEW COMPARISON:  Chest CTA 02/06/2016 and earlier. FINDINGS: Mildly lower lung volumes. Eventration of the diaphragm again noted, normal variant. Mediastinal contours remain within normal limits. Visualized tracheal air column is within normal limits. No pneumothorax, pulmonary edema, pleural effusion or confluent pulmonary opacity. No osseous abnormality identified. Negative visible bowel gas pattern. IMPRESSION: Negative.  No cardiopulmonary abnormality. Electronically Signed   By: Odessa Fleming M.D.   On: 03/14/2020 23:44   CT Angio Chest PE W and/or Wo Contrast  Addendum Date: 03/15/2020   ADDENDUM REPORT: 03/15/2020 03:20 ADDENDUM: These results were called by telephone at the time of interpretation on 03/15/2020 at 3:20 am  to provider [facility] , who verbally acknowledged these results. Electronically Signed   By: Kreg Shropshire M.D.   On: 03/15/2020 03:20   Result Date: 03/15/2020 CLINICAL DATA:  Shortness of breath EXAM: CT ANGIOGRAPHY CHEST WITH CONTRAST TECHNIQUE: Multidetector CT imaging of the chest was performed using the standard protocol during bolus administration of intravenous contrast. Multiplanar CT image reconstructions and MIPs were obtained to evaluate the vascular anatomy. CONTRAST:  33mL OMNIPAQUE IOHEXOL 350 MG/ML SOLN COMPARISON:  CT 02/06/2016 FINDINGS: Cardiovascular: Slightly suboptimal opacification of the pulmonary arteries. Images remain of diagnostic utility with hypodense filling defects visualized in the distal right main pulmonary artery extending into the lobar and segmental branches of the right middle and lower lobe. Additional segmental filling defect seen in the right upper lobe (2 5/102, 113, 72). Few segmental filling defects are suspected in the left lower lobe as well (5/122). No central pulmonary artery enlargement or elevation of the RV/LV ratio (0.8). Cardiac size at the upper limits of normal. No pericardial effusion. The aorta is normal caliber. Shared origin of the brachiocephalic  and left common carotid artery. Proximal great vessels are otherwise unremarkable. Mediastinum/Nodes: No mediastinal fluid or gas. Normal thyroid gland and thoracic inlet. No acute abnormality of the trachea or esophagus. No worrisome mediastinal, hilar or axillary adenopathy. Lungs/Pleura: Mild cystic change present in the lingula corresponding to areas of infection seen on comparison CT. Likely post infectious or inflammatory scarring/architectural distortion. Redistributed pulmonary vascularity with interlobular septal thickening at the apices and bases. Mild diffuse airways thickening could reflect some interstitial edematous features or sequela of chronic bronchitis given a similar appearance on prior  studies. No consolidation, pneumothorax, or effusion. No concerning pulmonary nodules or masses. Upper Abdomen: No acute abnormalities present in the visualized portions of the upper abdomen. Musculoskeletal: No acute osseous abnormality or suspicious osseous lesion. Mild bilateral gynecomastia. No worrisome chest wall lesions. Review of the MIP images confirms the above findings. IMPRESSION: 1. Sub op opacification of the pulmonary arteries. 2. Acute pulmonary emboli identified in the distal right main pulmonary artery extending into the lobar and segmental branches of the right middle and lower lobe. Additional segmental filling defects are suspected in the right upper lobe as well as in the left lower lobe as well. No central pulmonary artery enlargement or elevation of the RV/LV ratio (0.8). 3. Likely post infectious or inflammatory scarring/architectural distortion in the lingula corresponding to areas of infection seen on comparison CT. 4. Diffuse interlobular septal thickening and vascular redistribution may reflect mild interstitial edema. 5. Diffuse airways thickening could reflect some vascular cuffing as an additional edematous feature or bronchitic change. 6. Mild bilateral gynecomastia. Currently attempting to contact the ordering provider with a critical value result. Addendum will be submitted upon case discussion. Electronically Signed: By: Kreg Shropshire M.D. On: 03/15/2020 03:14   CT ABDOMEN PELVIS W CONTRAST  Result Date: 03/15/2020 CLINICAL DATA:  Abdominal pain. EXAM: CT ABDOMEN AND PELVIS WITH CONTRAST TECHNIQUE: Multidetector CT imaging of the abdomen and pelvis was performed using the standard protocol following bolus administration of intravenous contrast. CONTRAST:  OMNIPAQUE IOHEXOL 300 MG/ML  SOLN COMPARISON:  None. FINDINGS: Lower chest: There are partially visualized acute appearing pulmonary emboli involving the segmental and subsegmental branches of the bilateral lower lobes,  right greater than left.The RV/LV ratio appears to be elevated measuring approximately 1.2. Hepatobiliary: The liver is normal. Normal gallbladder.There is no biliary ductal dilation. Pancreas: Normal contours without ductal dilatation. No peripancreatic fluid collection. Spleen: Unremarkable. Adrenals/Urinary Tract: --Adrenal glands: Unremarkable. --Right kidney/ureter: No hydronephrosis or radiopaque kidney stones. --Left kidney/ureter: No hydronephrosis or radiopaque kidney stones. --Urinary bladder: Unremarkable. Stomach/Bowel: --Stomach/Duodenum: No hiatal hernia or other gastric abnormality. Normal duodenal course and caliber. --Small bowel: Unremarkable. --Colon: There appears to be some mild wall thickening of the cecum and ascending colon. --Appendix: Normal. Vascular/Lymphatic: Normal course and caliber of the major abdominal vessels. There is a possible filling defect within the right common femoral vein and right profundus femoris. --No retroperitoneal lymphadenopathy. --No mesenteric lymphadenopathy. --No pelvic or inguinal lymphadenopathy. Reproductive: Unremarkable Other: No ascites or free air. The abdominal wall is normal. Musculoskeletal. No acute displaced fractures. IMPRESSION: 1. There are partially visualized acute appearing pulmonary emboli involving the segmental and subsegmental branches of the bilateral lower lobes, right greater than left. The RV/LV ratio appears to be elevated measuring approximately 1.2 suggestive of possible heart strain. 2. There is a possible filling defect within the right common femoral vein and right profundus femoris. Recommend correlation with lower extremity DVT study. 3. There appears to be some mild wall  thickening of the cecum and ascending colon, which may be secondary to underdistention or mild infectious or inflammatory colitis. 4. Normal appendix in the right lower quadrant. These results were called by telephone at the time of interpretation on 03/15/2020  at 1:43 a.m. to provider [facility] , who verbally acknowledged these results. Electronically Signed   By: Constance Holster M.D.   On: 03/15/2020 02:32   US Venous Img Lower Unilateral Right  Result Date: 03/15/2020 CLINICAL DATA:  PE. EXAM: RIGHT LOWER EXTREMITY VENOUS DOPPLER ULTRASOUND TECHNIQUE: Gray-scale sonography with compression, as well as color and duplex ultrasound, were performed to evaluate the deep venous system(s) from the level of the common femoral vein through the popliteal and proximal calf veins. COMPARISON:  None. FINDINGS: VENOUS There is nonocclusive thrombus within the right common femoral vein, right profundus femoris vein, right femoral vein, and right popliteal vein. There is no definite DVT involving the tibial veins of the right lower extremity. Limited views of the contralateral common femoral vein are unremarkable. OTHER None. Limitations: none IMPRESSION: Extensive nonocclusive DVT throughout the right lower extremity from the right common femoral vein to the right popliteal vein. Electronically Signed   By: Constance Holster M.D.   On: 03/15/2020 03:50      Management plans discussed with the patient, an he told me he is going home.  CODE STATUS:     Code Status Orders  (From admission, onward)         Start     Ordered   03/15/20 0402  Full code  Continuous        03/15/20 0404        Code Status History    This patient has a current code status but no historical code status.   Advance Care Planning Activity      TOTAL TIME TAKING CARE OF THIS PATIENT: 44 minutes.    Loletha Grayer M.D on 03/15/2020 at 5:34 PM  Between 7am to 6pm - Pager - [phone]  After 6pm go to [email or web address] - password [email or web address]  [facility]  CC: Primary care physician; Patient, No Pcp Per

## 2020-03-15 NOTE — ED Notes (Signed)
This RN attempted to call report at this time. Receiving RN in another patient's room and will call back.

## 2020-03-15 NOTE — H&P (Signed)
History and Physical    Dalton Pena KCL:[id number] DOB: 1981-04-30 DOA: 03/15/2020  PCP: Patient, No Pcp Per   Patient coming from: home I have personally briefly reviewed patient's old medical records in [facility]  Chief Complaint: nausea, vomiting and diarrhea  HPI: Dalton Pena is a 39 y.o. male with medical history significant for DVT and PE in 2015 treated with Xarelto who presents to the emergency room with a 1 day onset of 4 episodes of vomiting, nonbloody nonbilious associated with a few episodes of runny diarrhea, and generalized low to moderate intensity crampy abdominal pain.  Symptoms started several hours after consuming a restaurant meal.  No affected contacts.  Patient endorses that for the past few months he has had some shortness of breath and also had swelling in his right leg that goes up and down. ED Course: On arrival he had a low-grade temperature 100.9 with otherwise normal vitals, O2 sat 99% on room air.  Blood work unremarkable.  CT abdomen and pelvis with contrast showed some mild wall thickening of the cecum and ascending colon possibly infectious or inflammatory colitis and also showed possible pulmonary emboli bilateral lower lobes as well as right common femoral vein DVT.  Follow-up study with CTA chest confirms acute PE distal right main pulmonary artery extending into the lobar and segmental branches with no elevation in RV LV ratio.  Lower extremity Doppler pending.  Patient was started on therapeutic dose Lovenox.  Hospitalist consulted for admission  Review of Systems: As per HPI otherwise 10 point review of systems negative.    Past Medical History:  Diagnosis Date  . Deep vein thrombosis (DVT) (HCC)   . Pulmonary embolism (HCC)     No past surgical history on file.   reports that he has been smoking. He has never used smokeless tobacco. He reports current alcohol use. He reports previous drug use.  No Known Allergies  No family  history on file.   Prior to Admission medications   Medication Sig Start Date End Date Taking? Authorizing Provider  acetaminophen-codeine (TYLENOL #3) 300-30 MG tablet Take 1-2 tablets by mouth every 4 (four) hours as needed for moderate pain. Patient not taking: Reported on 02/06/2016 01/08/16   Kem Boroughs B, FNP  lidocaine (XYLOCAINE) 2 % solution Use as directed 20 mLs in the mouth or throat as needed for mouth pain. Patient not taking: Reported on 02/06/2016 01/09/16   Beers, Charmayne Sheer, PA-C  Rivaroxaban (XARELTO) 15 MG TABS tablet Take 1 tablet (15 mg total) by mouth daily with supper. 02/06/16   Darci Current, MD    Physical Exam: Vitals:   03/14/20 1941 03/14/20 1943 03/14/20 1944  BP:   135/70  Pulse: 83    Resp: 20    Temp: (!) 100.9 F (38.3 C)    TempSrc: Oral    SpO2: 99%    Weight:  122.5 kg   Height:  5\' 5"  (1.651 m)      Vitals:   03/14/20 1941 03/14/20 1943 03/14/20 1944  BP:   135/70  Pulse: 83    Resp: 20    Temp: (!) 100.9 F (38.3 C)    TempSrc: Oral    SpO2: 99%    Weight:  122.5 kg   Height:  5\' 5"  (1.651 m)       Constitutional: Alert and oriented x 3 . Not in any apparent distress HEENT:      Head: Normocephalic and atraumatic.  Eyes: PERLA, EOMI, Conjunctivae are normal. Sclera is non-icteric.       Mouth/Throat: Mucous membranes are moist.       Neck: Supple with no signs of meningismus. Cardiovascular: Regular rate and rhythm. No murmurs, gallops, or rubs. 2+ symmetrical distal pulses are present . No JVD. 2+ right LE edema Respiratory: Respiratory effort slightly increased.Lungs sounds diminished bilaterally. No wheezes, crackles, or rhonchi.  Gastrointestinal: Soft, non tender, and non distended with positive bowel sounds. No rebound or guarding. Genitourinary: No CVA tenderness. Musculoskeletal: Nontender with normal range of motion in all extremities.  2+ right lower extremity edema, cyanosis, or erythema of  extremities. Neurologic: Normal speech and language. Face is symmetric. Moving all extremities. No gross focal neurologic deficits . Skin: Skin is warm, dry.  No rash or ulcers Psychiatric: Mood and affect are normal Speech and behavior are normal   Labs on Admission: I have personally reviewed following labs and imaging studies  CBC: Recent Labs  Lab 03/14/20 1945  WBC 10.7*  HGB 14.5  HCT 43.0  MCV 96.6  PLT 235   Basic Metabolic Panel: Recent Labs  Lab 03/14/20 1945  NA 137  K 3.8  CL 104  CO2 27  GLUCOSE 99  BUN 12  CREATININE 1.31*  CALCIUM 8.2*   GFR: Estimated Creatinine Clearance: 93.8 mL/min (A) (by C-G formula based on SCr of 1.31 mg/dL (H)). Liver Function Tests: Recent Labs  Lab 03/14/20 1945  AST 14*  ALT 16  ALKPHOS 44  BILITOT 0.9  PROT 6.7  ALBUMIN 3.4*   Recent Labs  Lab 03/14/20 1945  LIPASE 19   No results for input(s): AMMONIA in the last 168 hours. Coagulation Profile: No results for input(s): INR, PROTIME in the last 168 hours. Cardiac Enzymes: No results for input(s): CKTOTAL, CKMB, CKMBINDEX, TROPONINI in the last 168 hours. BNP (last 3 results) No results for input(s): PROBNP in the last 8760 hours. HbA1C: No results for input(s): HGBA1C in the last 72 hours. CBG: No results for input(s): GLUCAP in the last 168 hours. Lipid Profile: No results for input(s): CHOL, HDL, LDLCALC, TRIG, CHOLHDL, LDLDIRECT in the last 72 hours. Thyroid Function Tests: No results for input(s): TSH, T4TOTAL, FREET4, T3FREE, THYROIDAB in the last 72 hours. Anemia Panel: No results for input(s): VITAMINB12, FOLATE, FERRITIN, TIBC, IRON, RETICCTPCT in the last 72 hours. Urine analysis:    Component Value Date/Time   COLORURINE YELLOW (A) 03/14/2020 1945   APPEARANCEUR HAZY (A) 03/14/2020 1945   LABSPEC 1.028 03/14/2020 1945   PHURINE 5.0 03/14/2020 1945   GLUCOSEU NEGATIVE 03/14/2020 1945   HGBUR NEGATIVE 03/14/2020 1945   BILIRUBINUR NEGATIVE  03/14/2020 1945   KETONESUR NEGATIVE 03/14/2020 1945   PROTEINUR NEGATIVE 03/14/2020 1945   NITRITE NEGATIVE 03/14/2020 1945   LEUKOCYTESUR NEGATIVE 03/14/2020 1945    Radiological Exams on Admission: DG Chest 2 View  Result Date: 03/14/2020 CLINICAL DATA:  39 year old male with cough, fever, vomiting. EXAM: CHEST - 2 VIEW COMPARISON:  Chest CTA 02/06/2016 and earlier. FINDINGS: Mildly lower lung volumes. Eventration of the diaphragm again noted, normal variant. Mediastinal contours remain within normal limits. Visualized tracheal air column is within normal limits. No pneumothorax, pulmonary edema, pleural effusion or confluent pulmonary opacity. No osseous abnormality identified. Negative visible bowel gas pattern. IMPRESSION: Negative.  No cardiopulmonary abnormality. Electronically Signed   By: Genevie Ann M.D.   On: 03/14/2020 23:44   CT Angio Chest PE W and/or Wo Contrast  Addendum Date: 03/15/2020  ADDENDUM REPORT: 03/15/2020 03:20 ADDENDUM: These results were called by telephone at the time of interpretation on 03/15/2020 at 3:20 am to provider [facility] , who verbally acknowledged these results. Electronically Signed   By: Kreg Shropshire M.D.   On: 03/15/2020 03:20   Result Date: 03/15/2020 CLINICAL DATA:  Shortness of breath EXAM: CT ANGIOGRAPHY CHEST WITH CONTRAST TECHNIQUE: Multidetector CT imaging of the chest was performed using the standard protocol during bolus administration of intravenous contrast. Multiplanar CT image reconstructions and MIPs were obtained to evaluate the vascular anatomy. CONTRAST:  11mL OMNIPAQUE IOHEXOL 350 MG/ML SOLN COMPARISON:  CT 02/06/2016 FINDINGS: Cardiovascular: Slightly suboptimal opacification of the pulmonary arteries. Images remain of diagnostic utility with hypodense filling defects visualized in the distal right main pulmonary artery extending into the lobar and segmental branches of the right middle and lower lobe. Additional segmental filling  defect seen in the right upper lobe (2 5/102, 113, 72). Few segmental filling defects are suspected in the left lower lobe as well (5/122). No central pulmonary artery enlargement or elevation of the RV/LV ratio (0.8). Cardiac size at the upper limits of normal. No pericardial effusion. The aorta is normal caliber. Shared origin of the brachiocephalic and left common carotid artery. Proximal great vessels are otherwise unremarkable. Mediastinum/Nodes: No mediastinal fluid or gas. Normal thyroid gland and thoracic inlet. No acute abnormality of the trachea or esophagus. No worrisome mediastinal, hilar or axillary adenopathy. Lungs/Pleura: Mild cystic change present in the lingula corresponding to areas of infection seen on comparison CT. Likely post infectious or inflammatory scarring/architectural distortion. Redistributed pulmonary vascularity with interlobular septal thickening at the apices and bases. Mild diffuse airways thickening could reflect some interstitial edematous features or sequela of chronic bronchitis given a similar appearance on prior studies. No consolidation, pneumothorax, or effusion. No concerning pulmonary nodules or masses. Upper Abdomen: No acute abnormalities present in the visualized portions of the upper abdomen. Musculoskeletal: No acute osseous abnormality or suspicious osseous lesion. Mild bilateral gynecomastia. No worrisome chest wall lesions. Review of the MIP images confirms the above findings. IMPRESSION: 1. Sub op opacification of the pulmonary arteries. 2. Acute pulmonary emboli identified in the distal right main pulmonary artery extending into the lobar and segmental branches of the right middle and lower lobe. Additional segmental filling defects are suspected in the right upper lobe as well as in the left lower lobe as well. No central pulmonary artery enlargement or elevation of the RV/LV ratio (0.8). 3. Likely post infectious or inflammatory scarring/architectural  distortion in the lingula corresponding to areas of infection seen on comparison CT. 4. Diffuse interlobular septal thickening and vascular redistribution may reflect mild interstitial edema. 5. Diffuse airways thickening could reflect some vascular cuffing as an additional edematous feature or bronchitic change. 6. Mild bilateral gynecomastia. Currently attempting to contact the ordering provider with a critical value result. Addendum will be submitted upon case discussion. Electronically Signed: By: Kreg Shropshire M.D. On: 03/15/2020 03:14   CT ABDOMEN PELVIS W CONTRAST  Result Date: 03/15/2020 CLINICAL DATA:  Abdominal pain. EXAM: CT ABDOMEN AND PELVIS WITH CONTRAST TECHNIQUE: Multidetector CT imaging of the abdomen and pelvis was performed using the standard protocol following bolus administration of intravenous contrast. CONTRAST:  OMNIPAQUE IOHEXOL 300 MG/ML  SOLN COMPARISON:  None. FINDINGS: Lower chest: There are partially visualized acute appearing pulmonary emboli involving the segmental and subsegmental branches of the bilateral lower lobes, right greater than left.The RV/LV ratio appears to be elevated measuring approximately 1.2. Hepatobiliary: The  liver is normal. Normal gallbladder.There is no biliary ductal dilation. Pancreas: Normal contours without ductal dilatation. No peripancreatic fluid collection. Spleen: Unremarkable. Adrenals/Urinary Tract: --Adrenal glands: Unremarkable. --Right kidney/ureter: No hydronephrosis or radiopaque kidney stones. --Left kidney/ureter: No hydronephrosis or radiopaque kidney stones. --Urinary bladder: Unremarkable. Stomach/Bowel: --Stomach/Duodenum: No hiatal hernia or other gastric abnormality. Normal duodenal course and caliber. --Small bowel: Unremarkable. --Colon: There appears to be some mild wall thickening of the cecum and ascending colon. --Appendix: Normal. Vascular/Lymphatic: Normal course and caliber of the major abdominal vessels. There is a  possible filling defect within the right common femoral vein and right profundus femoris. --No retroperitoneal lymphadenopathy. --No mesenteric lymphadenopathy. --No pelvic or inguinal lymphadenopathy. Reproductive: Unremarkable Other: No ascites or free air. The abdominal wall is normal. Musculoskeletal. No acute displaced fractures. IMPRESSION: 1. There are partially visualized acute appearing pulmonary emboli involving the segmental and subsegmental branches of the bilateral lower lobes, right greater than left. The RV/LV ratio appears to be elevated measuring approximately 1.2 suggestive of possible heart strain. 2. There is a possible filling defect within the right common femoral vein and right profundus femoris. Recommend correlation with lower extremity DVT study. 3. There appears to be some mild wall thickening of the cecum and ascending colon, which may be secondary to underdistention or mild infectious or inflammatory colitis. 4. Normal appendix in the right lower quadrant. These results were called by telephone at the time of interpretation on 03/15/2020 at 1:43 a.m. to provider [facility] , who verbally acknowledged these results. Electronically Signed   By: Katherine Mantle M.D.   On: 03/15/2020 02:32    EKG: Independently reviewed.   Assessment/Plan Active Problems: Recurrent pulmonary emboli (HCC)  Acute deep vein thrombosis (DVT) of right femoral vein (HCC) -Patient had DVT and PE back in 2015 treated with Xarelto for a year -Continue IV heparin -Consider hematology consult -Follow-up hypercoagulability panel    Obesity, Class III, BMI 40-49.9 (morbid obesity) (HCC) -This complicates overall prognosis and care    Acute gastroenteritis -Patient presented with vomiting diarrhea and abdominal pain, which started after consuming a restaurant meal -CT abdomen and pelvis showing infectious versus inflammatory colitis -Supportive care.  IV fluids IV antiemetics -Stool cultures     DVT prophylaxis: Full dose heparin Code Status: full code  Family Communication:  none  Disposition Plan: Back to previous home environment Consults called: none  Status:obs    Andris Baumann MD [facility]     03/15/2020, 3:41 AM

## 2020-03-15 NOTE — Progress Notes (Signed)
ANTICOAGULATION CONSULT NOTE - Initial Consult  Pharmacy Consult for Heparin Indication: pulmonary embolus  No Known Allergies  Patient Measurements: Height: 5\' 5"  (165.1 cm) Weight: 122.5 kg (270 lb) IBW/kg (Calculated) : 61.5 HEPARIN DW (KG): 90.6  Vital Signs: Temp: 100.9 F (38.3 C) (06/09 1941) Temp Source: Oral (06/09 1941) BP: 126/83 (06/10 0347) Pulse Rate: 63 (06/10 0349)  Labs: Recent Labs    03/14/20 1945  HGB 14.5  HCT 43.0  PLT 275  CREATININE 1.31*    Estimated Creatinine Clearance: 93.8 mL/min (A) (by C-G formula based on SCr of 1.31 mg/dL (H)).   Medical History: Past Medical History:  Diagnosis Date  . Deep vein thrombosis (DVT) (HCC)   . Pulmonary embolism (HCC)     Medications:  (Not in a hospital admission)   Assessment: Baseline labs ordered.  Asked to initiate Heparin for PE.  No anticoagulants per PTA med list.  Goal of Therapy:  Heparin level 0.3-0.7 units/ml Monitor platelets by anticoagulation protocol: Yes   Plan:  Heparin 5000 units bolus x 1 then infusion at 1450 units/hr Check Heparin level 6 hours after heparin started  05/14/20 A 03/15/2020,6:36 AM

## 2020-03-15 NOTE — ED Notes (Signed)
Pt transported to CT ?

## 2020-03-15 NOTE — Progress Notes (Signed)
Patient ID: Dalton Pena, male   DOB: April 17, 1981, 39 y.o.   MRN: [id number]  Patient's nurse called me this afternoon saying that he wants to go to [facility] or he is going to leave AMA.  I mentioned to him now that he is admitted to the floor that usually hard to set up a transfer when I can call if he wanted me to.  He stated he was going to leave.  I stated that he is on heparin drip treating pulmonary embolism and that he can die if he leaves without blood thinner.  Patient refused blood thinner upon leaving the hospital and just wanted to leave.  I will have the nurse get a 30-day free card for Xarelto and I E prescribed Xarelto starter pack into his pharmacy.  Risk of death explained.  Dr Alford Highland

## 2020-03-15 NOTE — Progress Notes (Signed)
Patient ID: Dalton Pena, male   DOB: 1981-01-18, 39 y.o.   MRN: [id number] [facility] PROGRESS NOTE  Dalton Pena CBS:[id number] DOB: Aug 07, 1981 DOA: 03/15/2020 PCP: Patient, No Pcp Per  HPI/Subjective: Patient has been short of breath for a while.  Also has some leg pain and swelling for a while.  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  Came in with some diarrhea and fever.  Objective: Vitals:   03/15/20 1004 03/15/20 1045  BP: 126/77 131/79  Pulse: (!) 52 (!) 56  Resp: (!) 23 20  Temp: 99 F (37.2 C) 98.8 F (37.1 C)  SpO2: 95% 98%    Intake/Output Summary (Last 24 hours) at 03/15/2020 1353 Last data filed at 03/15/2020 0622 Gross per 24 hour  Intake 1500 ml  Output --  Net 1500 ml   Filed Weights   03/14/20 1943 03/15/20 1045  Weight: 122.5 kg 123.5 kg    ROS: Review of Systems  Constitutional: Positive for fever. Negative for chills.  Eyes: Negative for blurred vision.  Respiratory: Positive for shortness of breath. Negative for cough.   Cardiovascular: Negative for chest pain.  Gastrointestinal: Positive for diarrhea. Negative for abdominal pain, constipation, nausea and vomiting.  Genitourinary: Negative for dysuria.  Musculoskeletal: Positive for joint pain.  Neurological: Negative for dizziness.   Exam: Physical Exam  HENT:  Nose: No mucosal edema.  Mouth/Throat: No oropharyngeal exudate.  Eyes: Pupils are equal, round, and reactive to light. Conjunctivae and lids are normal.  Cardiovascular: S1 normal and S2 normal.  No murmur heard. Respiratory: No respiratory distress. He has no wheezes. He has no rhonchi. He has no rales.  GI: Soft. Bowel sounds are normal. There is no abdominal tenderness.  Musculoskeletal:     Right ankle: Swelling present.     Left ankle: Swelling present.  Lymphadenopathy:    He has no cervical adenopathy.  Neurological: He is alert. No cranial nerve deficit.  Skin: Skin is warm. No rash noted. Nails  show no clubbing.  Psychiatric: Mood normal.      Data Reviewed: Basic Metabolic Panel: Recent Labs  Lab 03/14/20 1945  NA 137  K 3.8  CL 104  CO2 27  GLUCOSE 99  BUN 12  CREATININE 1.31*  CALCIUM 8.2*   Liver Function Tests: Recent Labs  Lab 03/14/20 1945  AST 14*  ALT 16  ALKPHOS 44  BILITOT 0.9  PROT 6.7  ALBUMIN 3.4*   Recent Labs  Lab 03/14/20 1945  LIPASE 19   CBC: Recent Labs  Lab 03/14/20 1945  WBC 10.7*  HGB 14.5  HCT 43.0  MCV 96.6  PLT 275   BNP (last 3 results) Recent Labs    03/15/20 0624  BNP 28.9     Recent Results (from the past 240 hour(s))  SARS Coronavirus 2 by RT PCR (hospital order, performed in [facility] hospital lab) Nasopharyngeal Nasopharyngeal Swab     Status: None   Collection Time: 03/15/20  3:47 AM   Specimen: Nasopharyngeal Swab  Result Value Ref Range Status   SARS Coronavirus 2 NEGATIVE NEGATIVE Final    Comment: (NOTE) SARS-CoV-2 target nucleic acids are NOT DETECTED.  The SARS-CoV-2 RNA is generally detectable in upper and lower respiratory specimens during the acute phase of infection. The lowest concentration of SARS-CoV-2 viral copies this assay can detect is 250 copies / mL. A negative result does not preclude SARS-CoV-2 infection and should not be used as the sole basis for treatment  or other patient management decisions.  A negative result may occur with improper specimen collection / handling, submission of specimen other than nasopharyngeal swab, presence of viral mutation(s) within the areas targeted by this assay, and inadequate number of viral copies (<250 copies / mL). A negative result must be combined with clinical observations, patient history, and epidemiological information.  Fact Sheet for Patients:   BoilerBrush.com.cy  Fact Sheet for Healthcare Providers: https://pope.com/  This test is not yet approved or  cleared by the [place]  FDA and has been authorized for detection and/or diagnosis of SARS-CoV-2 by FDA under an Emergency Use Authorization (EUA).  This EUA will remain in effect (meaning this test can be used) for the duration of the COVID-19 declaration under Section 564(b)(1) of the Act, 21 U.S.C. section 360bbb-3(b)(1), unless the authorization is terminated or revoked sooner.  Performed at [facility], [address], [place], [place] [address]      Studies: DG Chest 2 View  Result Date: 03/14/2020 CLINICAL DATA:  39 year old male with cough, fever, vomiting. EXAM: CHEST - 2 VIEW COMPARISON:  Chest CTA 02/06/2016 and earlier. FINDINGS: Mildly lower lung volumes. Eventration of the diaphragm again noted, normal variant. Mediastinal contours remain within normal limits. Visualized tracheal air column is within normal limits. No pneumothorax, pulmonary edema, pleural effusion or confluent pulmonary opacity. No osseous abnormality identified. Negative visible bowel gas pattern. IMPRESSION: Negative.  No cardiopulmonary abnormality. Electronically Signed   By: Odessa Fleming M.D.   On: 03/14/2020 23:44   CT Angio Chest PE W and/or Wo Contrast  Addendum Date: 03/15/2020   ADDENDUM REPORT: 03/15/2020 03:20 ADDENDUM: These results were called by telephone at the time of interpretation on 03/15/2020 at 3:20 am to provider [facility] , who verbally acknowledged these results. Electronically Signed   By: Kreg Shropshire M.D.   On: 03/15/2020 03:20   Result Date: 03/15/2020 CLINICAL DATA:  Shortness of breath EXAM: CT ANGIOGRAPHY CHEST WITH CONTRAST TECHNIQUE: Multidetector CT imaging of the chest was performed using the standard protocol during bolus administration of intravenous contrast. Multiplanar CT image reconstructions and MIPs were obtained to evaluate the vascular anatomy. CONTRAST:  68mL OMNIPAQUE IOHEXOL 350 MG/ML SOLN COMPARISON:  CT 02/06/2016 FINDINGS: Cardiovascular: Slightly suboptimal opacification of  the pulmonary arteries. Images remain of diagnostic utility with hypodense filling defects visualized in the distal right main pulmonary artery extending into the lobar and segmental branches of the right middle and lower lobe. Additional segmental filling defect seen in the right upper lobe (2 5/102, 113, 72). Few segmental filling defects are suspected in the left lower lobe as well (5/122). No central pulmonary artery enlargement or elevation of the RV/LV ratio (0.8). Cardiac size at the upper limits of normal. No pericardial effusion. The aorta is normal caliber. Shared origin of the brachiocephalic and left common carotid artery. Proximal great vessels are otherwise unremarkable. Mediastinum/Nodes: No mediastinal fluid or gas. Normal thyroid gland and thoracic inlet. No acute abnormality of the trachea or esophagus. No worrisome mediastinal, hilar or axillary adenopathy. Lungs/Pleura: Mild cystic change present in the lingula corresponding to areas of infection seen on comparison CT. Likely post infectious or inflammatory scarring/architectural distortion. Redistributed pulmonary vascularity with interlobular septal thickening at the apices and bases. Mild diffuse airways thickening could reflect some interstitial edematous features or sequela of chronic bronchitis given a similar appearance on prior studies. No consolidation, pneumothorax, or effusion. No concerning pulmonary nodules or masses. Upper Abdomen: No acute abnormalities present in the visualized  portions of the upper abdomen. Musculoskeletal: No acute osseous abnormality or suspicious osseous lesion. Mild bilateral gynecomastia. No worrisome chest wall lesions. Review of the MIP images confirms the above findings. IMPRESSION: 1. Sub op opacification of the pulmonary arteries. 2. Acute pulmonary emboli identified in the distal right main pulmonary artery extending into the lobar and segmental branches of the right middle and lower lobe. Additional  segmental filling defects are suspected in the right upper lobe as well as in the left lower lobe as well. No central pulmonary artery enlargement or elevation of the RV/LV ratio (0.8). 3. Likely post infectious or inflammatory scarring/architectural distortion in the lingula corresponding to areas of infection seen on comparison CT. 4. Diffuse interlobular septal thickening and vascular redistribution may reflect mild interstitial edema. 5. Diffuse airways thickening could reflect some vascular cuffing as an additional edematous feature or bronchitic change. 6. Mild bilateral gynecomastia. Currently attempting to contact the ordering provider with a critical value result. Addendum will be submitted upon case discussion. Electronically Signed: By: Kreg Shropshire M.D. On: 03/15/2020 03:14   CT ABDOMEN PELVIS W CONTRAST  Result Date: 03/15/2020 CLINICAL DATA:  Abdominal pain. EXAM: CT ABDOMEN AND PELVIS WITH CONTRAST TECHNIQUE: Multidetector CT imaging of the abdomen and pelvis was performed using the standard protocol following bolus administration of intravenous contrast. CONTRAST:  OMNIPAQUE IOHEXOL 300 MG/ML  SOLN COMPARISON:  None. FINDINGS: Lower chest: There are partially visualized acute appearing pulmonary emboli involving the segmental and subsegmental branches of the bilateral lower lobes, right greater than left.The RV/LV ratio appears to be elevated measuring approximately 1.2. Hepatobiliary: The liver is normal. Normal gallbladder.There is no biliary ductal dilation. Pancreas: Normal contours without ductal dilatation. No peripancreatic fluid collection. Spleen: Unremarkable. Adrenals/Urinary Tract: --Adrenal glands: Unremarkable. --Right kidney/ureter: No hydronephrosis or radiopaque kidney stones. --Left kidney/ureter: No hydronephrosis or radiopaque kidney stones. --Urinary bladder: Unremarkable. Stomach/Bowel: --Stomach/Duodenum: No hiatal hernia or other gastric abnormality. Normal duodenal  course and caliber. --Small bowel: Unremarkable. --Colon: There appears to be some mild wall thickening of the cecum and ascending colon. --Appendix: Normal. Vascular/Lymphatic: Normal course and caliber of the major abdominal vessels. There is a possible filling defect within the right common femoral vein and right profundus femoris. --No retroperitoneal lymphadenopathy. --No mesenteric lymphadenopathy. --No pelvic or inguinal lymphadenopathy. Reproductive: Unremarkable Other: No ascites or free air. The abdominal wall is normal. Musculoskeletal. No acute displaced fractures. IMPRESSION: 1. There are partially visualized acute appearing pulmonary emboli involving the segmental and subsegmental branches of the bilateral lower lobes, right greater than left. The RV/LV ratio appears to be elevated measuring approximately 1.2 suggestive of possible heart strain. 2. There is a possible filling defect within the right common femoral vein and right profundus femoris. Recommend correlation with lower extremity DVT study. 3. There appears to be some mild wall thickening of the cecum and ascending colon, which may be secondary to underdistention or mild infectious or inflammatory colitis. 4. Normal appendix in the right lower quadrant. These results were called by telephone at the time of interpretation on 03/15/2020 at 1:43 a.m. to provider [facility] , who verbally acknowledged these results. Electronically Signed   By: Katherine Mantle M.D.   On: 03/15/2020 02:32   US Venous Img Lower Unilateral Right  Result Date: 03/15/2020 CLINICAL DATA:  PE. EXAM: RIGHT LOWER EXTREMITY VENOUS DOPPLER ULTRASOUND TECHNIQUE: Gray-scale sonography with compression, as well as color and duplex ultrasound, were performed to evaluate the deep venous system(s) from the level of the common  femoral vein through the popliteal and proximal calf veins. COMPARISON:  None. FINDINGS: VENOUS There is nonocclusive thrombus within the right  common femoral vein, right profundus femoris vein, right femoral vein, and right popliteal vein. There is no definite DVT involving the tibial veins of the right lower extremity. Limited views of the contralateral common femoral vein are unremarkable. OTHER None. Limitations: none IMPRESSION: Extensive nonocclusive DVT throughout the right lower extremity from the right common femoral vein to the right popliteal vein. Electronically Signed   By: Katherine Mantle M.D.   On: 03/15/2020 03:50    Scheduled Meds: . sodium chloride flush  3 mL Intravenous Once   Continuous Infusions: . heparin 1,450 Units/hr (03/15/20 5183)    Assessment/Plan:  1. 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  Case discussed with vascular surgery and they recommended anticoagulation follow-up with hematology as outpatient.  Currently on heparin drip.  The patient has been on Xarelto previously.  Will have transitional care team look into medication management and which medication they have over there and can get a 30-day free card. 2. Gastroenteritis.  This seemed to settle down patient on a diet.  Stool studies ordered. 3. Obesity BMI 45.3     Code Status:     Code Status Orders  (From admission, onward)         Start     Ordered   03/15/20 0402  Full code  Continuous        03/15/20 0404        Code Status History    This patient has a current code status but no historical code status.   Advance Care Planning Activity     Family Communication: Refuse me calling family Disposition Plan: Status is: Inpatient  Dispo: The patient is from: Home              Anticipated d/c is to: Home              Anticipated d/c date is: Potentially 03/16/2020 if feeling better              Patient currently being treated for multiple lobe pulmonary embolism and right DVT   Time spent: 35 minutes    [employer]

## 2020-03-15 NOTE — Progress Notes (Signed)
After talking to MD, patient still wants to leave AMA. I explained to patient that he is on the heparin drip because he has PE and DVT and that he can die if he does not take his blood thinner. Patient still want to leave. Charge nurse aware. Pt signed AMA form. Xarelto started kit/coupon card given to patient and stressed the importance of taking his blood thinner. IV and tele box removed. Pt escorted to Medical mall.

## 2020-03-15 NOTE — ED Provider Notes (Addendum)
[facility] Emergency Department Provider Note  ____________________________________________   First MD Initiated Contact with Patient 03/15/20 [phone]     (approximate)  I have reviewed the triage vital signs and the nursing notes.   HISTORY  Chief Complaint Abdominal Pain    HPI Dalton FiguresRichard L Zavadil is a 39 y.o. male presents to the emergency department secondary to acute onset of vomiting and diarrhea with generalized abdominal discomfort that began today.  Patient also admits to a cough however no chest pain.  Patient denies any dyspnea.  Patient noted to be febrile on arrival with temperature 100.9.  Patient also admits to "dyspnea for "a while and right lower extremity pain and swelling.  Patient states that current pain score is 8 out of 10.     Past Medical History:  Diagnosis Date  . Deep vein thrombosis (DVT) (HCC)   . Pulmonary embolism [facility](HCC)     Patient Active Problem List   Diagnosis Date Noted  . Acute deep vein thrombosis (DVT) of right femoral vein (HCC) 03/15/2020  . 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 Obesity, Class III, BMI 40-49.9 (morbid obesity) (HCC) 03/15/2020  . Acute gastroenteritis 03/15/2020  . Acute pulmonary embolism (HCC) 03/15/2020    No past surgical history on file.  Prior to Admission medications   Not on File    Allergies Patient has no known allergies.  No family history on file.  Social History Social History   Tobacco Use  . Smoking status: Current Every Day Smoker  . Smokeless tobacco: Never Used  Substance Use Topics  . Alcohol use: Yes  . Drug use: Not Currently    Review of Systems Constitutional: No fever/chills Eyes: No visual changes. ENT: No sore throat. Cardiovascular: Denies chest pain. Respiratory: Denies shortness of breath. Gastrointestinal: Positive for abdominal pain nausea vomiting and diarrhea Genitourinary: Negative for dysuria. Musculoskeletal: Negative for neck  pain.  Negative for back pain. Integumentary: Negative for rash. Neurological: Negative for headaches, focal weakness or numbness.   ____________________________________________   PHYSICAL EXAM:  VITAL SIGNS: ED Triage Vitals  Enc Vitals Group     BP 03/14/20 1944 135/70     Pulse Rate 03/14/20 1941 83     Resp 03/14/20 1941 20     Temp 03/14/20 1941 (!) 100.9 F (38.3 C)     Temp Source 03/14/20 1941 Oral     SpO2 03/14/20 1941 99 %     Weight 03/14/20 1943 122.5 kg (270 lb)     Height 03/14/20 1943 1.651 m (5\' 5" )     Head Circumference --      Peak Flow --      Pain Score 03/14/20 1942 7     Pain Loc --      Pain Edu? --      Excl. in GC? --     Constitutional: Alert and oriented.  Eyes: Conjunctivae are normal.  Mouth/Throat: Patient is wearing a mask. Neck: No stridor.  No meningeal signs.   Cardiovascular: Normal rate, regular rhythm. Good peripheral circulation. Grossly normal heart sounds. Respiratory: Normal respiratory effort.  No retractions. Gastrointestinal: Soft and nontender. No distention.  Musculoskeletal: Right lower extremity swelling in comparison to left.  Pain to calf palpation Neurologic:  Normal speech and language. No gross focal neurologic deficits are appreciated.  Skin:  Skin is warm, dry and intact. Psychiatric: Mood and affect are normal. Speech and behavior are normal.  ____________________________________________   LABS (all labs ordered are  listed, but only abnormal results are displayed)  Labs Reviewed  COMPREHENSIVE METABOLIC PANEL - Abnormal; Notable for the following components:      Result Value   Creatinine, Ser 1.31 (*)    Calcium 8.2 (*)    Albumin 3.4 (*)    AST 14 (*)    All other components within normal limits  CBC - Abnormal; Notable for the following components:   WBC 10.7 (*)    All other components within normal limits  URINALYSIS, COMPLETE (UACMP) WITH MICROSCOPIC - Abnormal; Notable for the following  components:   Color, Urine YELLOW (*)    APPearance HAZY (*)    All other components within normal limits  SARS CORONAVIRUS 2 BY RT PCR (HOSPITAL ORDER, PERFORMED IN [facility])  GASTROINTESTINAL PANEL BY PCR, STOOL (REPLACES STOOL CULTURE)  LIPASE, BLOOD  ANTITHROMBIN III  PROTEIN C ACTIVITY  PROTEIN C, TOTAL  PROTEIN S ACTIVITY  PROTEIN S, TOTAL  LUPUS ANTICOAGULANT PANEL  BETA-2-GLYCOPROTEIN I ABS, IGG/M/A  HOMOCYSTEINE  FACTOR 5 LEIDEN  PROTHROMBIN GENE MUTATION  CARDIOLIPIN ANTIBODIES, IGG, IGM, IGA  HIV ANTIBODY (ROUTINE TESTING W REFLEX)  BRAIN NATRIURETIC PEPTIDE  APTT  PROTIME-INR   ____________________________________________  EKG  ED ECG REPORT I, Leslie N Averie Meiner, the attending physician, personally viewed and interpreted this ECG.   Date: 03/15/2020  EKG Time: 3:41 AM  Rate: 74  Rhythm: Normal sinus rhythm  Axis: Normal  Intervals: Normal  ST&T Change: None ____________________________________  RADIOLOGY I, [place] N Kodi Guerrera, personally viewed and evaluated these images (plain radiographs) as part of my medical decision making, as well as reviewing the written report by the radiologist.  ED MD interpretation: Negative chest x-ray per radiologist  CT abdomen revealed acute pulmonary emboli involving the segmental and subsegmental branches of bilateral lower lobes right greater than left RV LV ratio appears to be elevated measuring approximately 1.2 reflecting possible right heart strain filling defect in the right common femoral vein and profunda femoris mild wall thickening of the cecum and ascending colon  Official radiology report(s): DG Chest 2 View  Result Date: 03/14/2020 CLINICAL DATA:  39 year old male with cough, fever, vomiting. EXAM: CHEST - 2 VIEW COMPARISON:  Chest CTA 02/06/2016 and earlier. FINDINGS: Mildly lower lung volumes. Eventration of the diaphragm again noted, normal variant. Mediastinal contours remain within normal  limits. Visualized tracheal air column is within normal limits. No pneumothorax, pulmonary edema, pleural effusion or confluent pulmonary opacity. No osseous abnormality identified. Negative visible bowel gas pattern. IMPRESSION: Negative.  No cardiopulmonary abnormality. Electronically Signed   By: Odessa Fleming M.D.   On: 03/14/2020 23:44   CT Angio Chest PE W and/or Wo Contrast  Addendum Date: 03/15/2020   ADDENDUM REPORT: 03/15/2020 03:20 ADDENDUM: These results were called by telephone at the time of interpretation on 03/15/2020 at 3:20 am to provider [facility] , who verbally acknowledged these results. Electronically Signed   By: Kreg Shropshire M.D.   On: 03/15/2020 03:20   Result Date: 03/15/2020 CLINICAL DATA:  Shortness of breath EXAM: CT ANGIOGRAPHY CHEST WITH CONTRAST TECHNIQUE: Multidetector CT imaging of the chest was performed using the standard protocol during bolus administration of intravenous contrast. Multiplanar CT image reconstructions and MIPs were obtained to evaluate the vascular anatomy. CONTRAST:  50mL OMNIPAQUE IOHEXOL 350 MG/ML SOLN COMPARISON:  CT 02/06/2016 FINDINGS: Cardiovascular: Slightly suboptimal opacification of the pulmonary arteries. Images remain of diagnostic utility with hypodense filling defects visualized in the distal right main pulmonary artery extending  into the lobar and segmental branches of the right middle and lower lobe. Additional segmental filling defect seen in the right upper lobe (2 5/102, 113, 72). Few segmental filling defects are suspected in the left lower lobe as well (5/122). No central pulmonary artery enlargement or elevation of the RV/LV ratio (0.8). Cardiac size at the upper limits of normal. No pericardial effusion. The aorta is normal caliber. Shared origin of the brachiocephalic and left common carotid artery. Proximal great vessels are otherwise unremarkable. Mediastinum/Nodes: No mediastinal fluid or gas. Normal thyroid gland and thoracic  inlet. No acute abnormality of the trachea or esophagus. No worrisome mediastinal, hilar or axillary adenopathy. Lungs/Pleura: Mild cystic change present in the lingula corresponding to areas of infection seen on comparison CT. Likely post infectious or inflammatory scarring/architectural distortion. Redistributed pulmonary vascularity with interlobular septal thickening at the apices and bases. Mild diffuse airways thickening could reflect some interstitial edematous features or sequela of chronic bronchitis given a similar appearance on prior studies. No consolidation, pneumothorax, or effusion. No concerning pulmonary nodules or masses. Upper Abdomen: No acute abnormalities present in the visualized portions of the upper abdomen. Musculoskeletal: No acute osseous abnormality or suspicious osseous lesion. Mild bilateral gynecomastia. No worrisome chest wall lesions. Review of the MIP images confirms the above findings. IMPRESSION: 1. Sub op opacification of the pulmonary arteries. 2. Acute pulmonary emboli identified in the distal right main pulmonary artery extending into the lobar and segmental branches of the right middle and lower lobe. Additional segmental filling defects are suspected in the right upper lobe as well as in the left lower lobe as well. No central pulmonary artery enlargement or elevation of the RV/LV ratio (0.8). 3. Likely post infectious or inflammatory scarring/architectural distortion in the lingula corresponding to areas of infection seen on comparison CT. 4. Diffuse interlobular septal thickening and vascular redistribution may reflect mild interstitial edema. 5. Diffuse airways thickening could reflect some vascular cuffing as an additional edematous feature or bronchitic change. 6. Mild bilateral gynecomastia. Currently attempting to contact the ordering provider with a critical value result. Addendum will be submitted upon case discussion. Electronically Signed: By: Lovena Le M.D.  On: 03/15/2020 03:14   CT ABDOMEN PELVIS W CONTRAST  Result Date: 03/15/2020 CLINICAL DATA:  Abdominal pain. EXAM: CT ABDOMEN AND PELVIS WITH CONTRAST TECHNIQUE: Multidetector CT imaging of the abdomen and pelvis was performed using the standard protocol following bolus administration of intravenous contrast. CONTRAST:  117mL OMNIPAQUE IOHEXOL 300 MG/ML  SOLN COMPARISON:  None. FINDINGS: Lower chest: There are partially visualized acute appearing pulmonary emboli involving the segmental and subsegmental branches of the bilateral lower lobes, right greater than left.The RV/LV ratio appears to be elevated measuring approximately 1.2. Hepatobiliary: The liver is normal. Normal gallbladder.There is no biliary ductal dilation. Pancreas: Normal contours without ductal dilatation. No peripancreatic fluid collection. Spleen: Unremarkable. Adrenals/Urinary Tract: --Adrenal glands: Unremarkable. --Right kidney/ureter: No hydronephrosis or radiopaque kidney stones. --Left kidney/ureter: No hydronephrosis or radiopaque kidney stones. --Urinary bladder: Unremarkable. Stomach/Bowel: --Stomach/Duodenum: No hiatal hernia or other gastric abnormality. Normal duodenal course and caliber. --Small bowel: Unremarkable. --Colon: There appears to be some mild wall thickening of the cecum and ascending colon. --Appendix: Normal. Vascular/Lymphatic: Normal course and caliber of the major abdominal vessels. There is a possible filling defect within the right common femoral vein and right profundus femoris. --No retroperitoneal lymphadenopathy. --No mesenteric lymphadenopathy. --No pelvic or inguinal lymphadenopathy. Reproductive: Unremarkable Other: No ascites or free air. The abdominal wall is normal. Musculoskeletal. No acute  displaced fractures. IMPRESSION: 1. There are partially visualized acute appearing pulmonary emboli involving the segmental and subsegmental branches of the bilateral lower lobes, right greater than left. The  RV/LV ratio appears to be elevated measuring approximately 1.2 suggestive of possible heart strain. 2. There is a possible filling defect within the right common femoral vein and right profundus femoris. Recommend correlation with lower extremity DVT study. 3. There appears to be some mild wall thickening of the cecum and ascending colon, which may be secondary to underdistention or mild infectious or inflammatory colitis. 4. Normal appendix in the right lower quadrant. These results were called by telephone at the time of interpretation on 03/15/2020 at 1:43 a.m. to provider [facility] , who verbally acknowledged these results. Electronically Signed   By: Katherine Mantle M.D.   On: 03/15/2020 02:32   US Venous Img Lower Unilateral Right  Result Date: 03/15/2020 CLINICAL DATA:  PE. EXAM: RIGHT LOWER EXTREMITY VENOUS DOPPLER ULTRASOUND TECHNIQUE: Gray-scale sonography with compression, as well as color and duplex ultrasound, were performed to evaluate the deep venous system(s) from the level of the common femoral vein through the popliteal and proximal calf veins. COMPARISON:  None. FINDINGS: VENOUS There is nonocclusive thrombus within the right common femoral vein, right profundus femoris vein, right femoral vein, and right popliteal vein. There is no definite DVT involving the tibial veins of the right lower extremity. Limited views of the contralateral common femoral vein are unremarkable. OTHER None. Limitations: none IMPRESSION: Extensive nonocclusive DVT throughout the right lower extremity from the right common femoral vein to the right popliteal vein. Electronically Signed   By: Katherine Mantle M.D.   On: 03/15/2020 03:50     .Critical Care Performed by: Darci Current, MD Authorized by: Darci Current, MD   Critical care provider statement:    Critical care time (minutes):  30   Critical care time was exclusive of:  Separately billable procedures and treating other patients    Critical care was necessary to treat or prevent imminent or life-threatening deterioration of the following conditions:  Respiratory failure and circulatory failure   Critical care was time spent personally by me on the following activities:  Development of treatment plan with patient or surrogate, discussions with consultants, evaluation of patient's response to treatment, examination of patient, obtaining history from patient or surrogate, ordering and performing treatments and interventions, ordering and review of laboratory studies, ordering and review of radiographic studies, pulse oximetry, re-evaluation of patient's condition and review of old charts     ____________________________________________   INITIAL IMPRESSION / MDM / ASSESSMENT AND PLAN / ED COURSE  As part of my medical decision making, I reviewed the following data within the electronic MEDICAL RECORD NUMBER    39 year old male presented with above-stated history and physical exam a differential diagnosis including but not limited to gastroenteritis, colitis, diverticulitis.  Regarding the patient's dyspnea and lower extremity discomfort and swelling which patient states has been occurring for "a while".  Concern for possible pulmonary emboli DVT.  CT abdomen pelvis was performed which revealed evidence of colitis however pulmonary emboli is noted in the right lung on CT abdomen.  CT angiogram of the chest was subsequently performed which revealed multiple pulmonary emboli as is noted on above CT angiogram impression.  Ultrasound of the right lower extremity except revealed extensive DVT extending from the femoral to the popliteal vein.  As a result from bolus and infusion initiated.  Patient discussed with Dr. Para March for hospital  admission for further evaluation and management. ____________________________________________  FINAL CLINICAL IMPRESSION(S) / ED DIAGNOSES  Final diagnoses:  Other acute pulmonary embolism without acute cor  pulmonale (HCC)  Acute deep vein thrombosis (DVT) of calf muscle vein of right lower extremity (HCC)  Colitis     MEDICATIONS GIVEN DURING THIS VISIT:  Medications  sodium chloride flush (NS) 0.9 % injection 3 mL (3 mLs Intravenous Not Given 03/15/20 0865)  morphine 2 MG/ML injection 2 mg (has no administration in time range)  acetaminophen (TYLENOL) tablet 650 mg (has no administration in time range)    Or  acetaminophen (TYLENOL) suppository 650 mg (has no administration in time range)  HYDROcodone-acetaminophen (NORCO/VICODIN) 5-325 MG per tablet 1-2 tablet (has no administration in time range)  ondansetron (ZOFRAN) tablet 4 mg (has no administration in time range)    Or  ondansetron (ZOFRAN) injection 4 mg (has no administration in time range)  heparin bolus via infusion 5,000 Units (has no administration in time range)    Followed by  heparin ADULT infusion 100 units/mL (25000 units/252mL sodium chloride 0.45%) (has no administration in time range)  sodium chloride 0.9 % bolus 1,000 mL (1,000 mLs Intravenous New Bag/Given 03/15/20 0150)  sodium chloride 0.9 % bolus 1,000 mL (1,000 mLs Intravenous New Bag/Given 03/15/20 0150)  ondansetron (ZOFRAN) injection 4 mg (4 mg Intravenous Given 03/15/20 0150)  iohexol (OMNIPAQUE) 300 MG/ML solution 125 mL (125 mLs Intravenous Contrast Given 03/15/20 0055)  iohexol (OMNIPAQUE) 350 MG/ML injection 75 mL (75 mLs Intravenous Contrast Given 03/15/20 0230)     ED Discharge Orders    None      *Please note:  Dalton Pena was evaluated in Emergency Department on 03/15/2020 for the symptoms described in the history of present illness. He was evaluated in the context of the global COVID-19 pandemic, which necessitated consideration that the patient might be at risk for infection with the SARS-CoV-2 virus that causes COVID-19. Institutional protocols and algorithms that pertain to the evaluation of patients at risk for COVID-19 are in a state of  rapid change based on information released by regulatory bodies including the CDC and federal and state organizations. These policies and algorithms were followed during the patient's care in the ED.  Some ED evaluations and interventions may be delayed as a result of limited staffing during the pandemic.*  Note:  This document was prepared using Dragon voice recognition software and may include unintentional dictation errors.   Darci Current, MD 03/15/20 [id number]    Darci Current, MD 03/29/20 [phone]

## 2020-03-15 NOTE — Progress Notes (Signed)
Pt requesting to speak to MD, pt states he wants to be transferred to [facility]. If not he wants to leave AMA and he will go to their ER room. Dr. Renae Gloss notified will call into room shortly.

## 2020-03-16 LAB — HOMOCYSTEINE: Homocysteine: 7 umol/L (ref 0.0–14.5)

## 2020-03-17 LAB — CARDIOLIPIN ANTIBODIES, IGG, IGM, IGA
Anticardiolipin IgA: <9 APL U/mL (ref 0–11)
Anticardiolipin IgG: 9 GPL U/mL (ref 0–14)
Anticardiolipin IgM: 9 MPL U/mL (ref 0–12)

## 2020-03-17 LAB — BETA-2-GLYCOPROTEIN I ABS, IGG/M/A
Beta-2 Glyco I IgG: <9 GPI IgG units (ref 0–20)
Beta-2-Glycoprotein I IgA: <9 GPI IgA units (ref 0–25)
Beta-2-Glycoprotein I IgM: <9 GPI IgM units (ref 0–32)

## 2020-03-17 LAB — PROTEIN C, TOTAL: Protein C, Total: 72 % (ref 60–150)

## 2020-03-17 LAB — PROTEIN S, TOTAL: Protein S Ag, Total: 69 % (ref 60–150)

## 2020-03-17 LAB — PROTEIN S ACTIVITY: Protein S Activity: 64 % (ref 63–140)

## 2020-03-17 LAB — PROTEIN C ACTIVITY: Protein C Activity: 77 % (ref 73–180)

## 2020-03-19 LAB — HEXAGONAL PHASE PHOSPHOLIPID: Hexagonal Phase Phospholipid: 0 s (ref 0–11)

## 2020-03-19 LAB — PTT-LA MIX: PTT-LA Mix: 54.8 s — ABNORMAL HIGH (ref 0.0–48.9)

## 2020-03-19 LAB — LUPUS ANTICOAGULANT PANEL
DRVVT: 35.2 s (ref 0.0–47.0)
PTT Lupus Anticoagulant: 60.6 s — ABNORMAL HIGH (ref 0.0–51.9)

## 2020-03-21 LAB — FACTOR 5 LEIDEN

## 2020-03-22 LAB — PROTHROMBIN GENE MUTATION

## 2020-07-13 ENCOUNTER — Other Ambulatory Visit: Payer: Self-pay

## 2020-07-13 ENCOUNTER — Emergency Department: Payer: Self-pay

## 2020-07-13 ENCOUNTER — Emergency Department
Admission: EM | Admit: 2020-07-13 | Discharge: 2020-07-13 | Disposition: A | Discharge status: Home / Self Care | Payer: Self-pay | Attending: Emergency Medicine | Admitting: Emergency Medicine

## 2020-07-13 ENCOUNTER — Encounter: Payer: Self-pay | Admitting: Emergency Medicine

## 2020-07-13 DIAGNOSIS — F17200 Nicotine dependence, unspecified, uncomplicated: Secondary | ICD-10-CM | POA: Insufficient documentation

## 2020-07-13 DIAGNOSIS — Z86718 Personal history of other venous thrombosis and embolism: Secondary | ICD-10-CM | POA: Insufficient documentation

## 2020-07-13 DIAGNOSIS — Z86711 Personal history of pulmonary embolism: Secondary | ICD-10-CM | POA: Insufficient documentation

## 2020-07-13 DIAGNOSIS — R079 Chest pain, unspecified: Secondary | ICD-10-CM | POA: Insufficient documentation

## 2020-07-13 DIAGNOSIS — Z7901 Long term (current) use of anticoagulants: Secondary | ICD-10-CM | POA: Insufficient documentation

## 2020-07-13 LAB — CBC
HCT: 45.1 % (ref 39.0–52.0)
Hemoglobin: 15.2 g/dL (ref 13.0–17.0)
MCH: 32.8 pg (ref 26.0–34.0)
MCHC: 33.7 g/dL (ref 30.0–36.0)
MCV: 97.4 fL (ref 80.0–100.0)
Platelets: 310 10*3/uL (ref 150–400)
RBC: 4.63 MIL/uL (ref 4.22–5.81)
RDW: 13.2 % (ref 11.5–15.5)
WBC: 15.4 10*3/uL — ABNORMAL HIGH (ref 4.0–10.5)
nRBC: 0 % (ref 0.0–0.2)

## 2020-07-13 LAB — BASIC METABOLIC PANEL
Anion gap: 10 (ref 5–15)
BUN: 10 mg/dL (ref 6–20)
CO2: 23 mmol/L (ref 22–32)
Calcium: 8.7 mg/dL — ABNORMAL LOW (ref 8.9–10.3)
Chloride: 104 mmol/L (ref 98–111)
Creatinine, Ser: 1.12 mg/dL (ref 0.61–1.24)
GFR, Estimated: >60 mL/min (ref >60)
Glucose, Bld: 95 mg/dL (ref 70–99)
Potassium: 4 mmol/L (ref 3.5–5.1)
Sodium: 137 mmol/L (ref 135–145)

## 2020-07-13 LAB — FIBRIN DERIVATIVES D-DIMER (ARMC ONLY): Fibrin derivatives D-dimer (ARMC): 314.43 ng/mL (FEU) (ref 0.00–499.00)

## 2020-07-13 LAB — TROPONIN I (HIGH SENSITIVITY)
Troponin I (High Sensitivity): 5 ng/L (ref <18)
Troponin I (High Sensitivity): 6 ng/L (ref <18)

## 2020-07-13 MED ORDER — IOHEXOL 350 MG/ML SOLN: 100.0000 mL | Freq: Once | INTRAVENOUS | Status: DC | PRN

## 2020-07-13 NOTE — ED Provider Notes (Signed)
[facility] Emergency Department Provider Note  Time seen: 7:53 PM  I have reviewed the triage vital signs and the nursing notes.   HISTORY  Chief Complaint Chest Pain   HPI Dalton Pena is a 39 y.o. male with a past medical history of DVT, PE in June 2021 presents to the emergency department for chest pain.  According to the patient earlier today he developed moderate sharp chest pain in his left chest radiating to his back.  Patient states a history of prior PE and DVT but cannot afford his anticoagulation so he has not been taking it for several months.  Patient denies any new leg pain or swelling.  Denies any shortness of breath.  States the pain was somewhat pleuritic and radiating to his back, largely resolved at this time.  Past Medical History:  Diagnosis Date  . Deep vein thrombosis (DVT) (HCC)   . Pulmonary embolism [facility])     Patient Active Problem List   Diagnosis Date Noted  . Acute deep vein thrombosis (DVT) of right femoral vein (HCC) 03/15/2020  . 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 Obesity, Class III, BMI 40-49.9 (morbid obesity) (HCC) 03/15/2020  . Acute gastroenteritis 03/15/2020  . Acute pulmonary embolism (HCC) 03/15/2020  . Pulmonary embolism (HCC) 03/15/2020  . Acute deep vein thrombosis (DVT) of calf muscle vein of right lower extremity (HCC)     History reviewed. No pertinent surgical history.  Prior to Admission medications   Medication Sig Start Date End Date Taking? Authorizing Provider  RIVAROXABAN Carlena Hurl) VTE STARTER PACK (15 & 20 MG TABLETS) Follow package directions: Take one 15mg  tablet by mouth twice a day. On day 22, switch to one 20mg  tablet once a day. Take with food. 03/15/20   , MD    No Known Allergies  History reviewed. No pertinent family history.  Social History Social History   Tobacco Use  . Smoking status: Current Every Day Smoker  . Smokeless tobacco: Never Used   Substance Use Topics  . Alcohol use: Yes  . Drug use: Not Currently    Review of Systems Constitutional: Negative for fever. Cardiovascular: Positive for chest pain, now resolved Respiratory: Negative for shortness of breath. Gastrointestinal: Negative for abdominal pain, vomiting  Musculoskeletal: Negative for musculoskeletal complaints Neurological: Negative for headache All other ROS negative  ____________________________________________   PHYSICAL EXAM:  VITAL SIGNS: ED Triage Vitals  Enc Vitals Group     BP 07/13/20 1708 130/80     Pulse Rate 07/13/20 1708 71     Resp 07/13/20 1708 18     Temp 07/13/20 1708 98.4 F (36.9 C)     Temp Source 07/13/20 1708 Oral     SpO2 07/13/20 1708 97 %     Weight 07/13/20 1709 280 lb (127 kg)     Height 07/13/20 1709 5\' 5"  (1.651 m)     Head Circumference --      Peak Flow --      Pain Score 07/13/20 1709 8     Pain Loc --      Pain Edu? --      Excl. in GC? --    Constitutional: Alert and oriented. Well appearing and in no distress. Eyes: Normal exam ENT      Head: Normocephalic and atraumatic.      Mouth/Throat: Mucous membranes are moist. Cardiovascular: Normal rate, regular rhythm. No murmur Respiratory: Normal respiratory effort without tachypnea nor retractions. Breath sounds are clear Gastrointestinal:  Soft and nontender. No distention.  Musculoskeletal: Nontender with normal range of motion in all extremities.  Neurologic:  Normal speech and language. No gross focal neurologic deficits Skin:  Skin is warm, dry and intact.  Psychiatric: Mood and affect are normal.   ____________________________________________    EKG  EKG viewed and interpreted by myself shows a normal sinus rhythm at 72 bpm with a narrow QRS, normal axis, normal intervals, nonspecific ST changes.  ____________________________________________    RADIOLOGY  Chest x-ray is negative  ____________________________________________   INITIAL  IMPRESSION / ASSESSMENT AND PLAN / ED COURSE  Pertinent labs & imaging results that were available during my care of the patient were reviewed by me and considered in my medical decision making (see chart for details).   Patient presents to the emergency department for chest pain.  Patient has a history of a prior PE and DVT but stopped taking his anticoagulation several months ago due to cost.  Given the patient's description of his pain and history we will obtain a CTA of the chest.  Reassuringly patient's troponin is negative, repeat troponin pending.  Patient's D-dimer is negative as well.  Remainder the lab work shows mild leukocytosis otherwise nonrevealing.  Repeat troponin negative.  CTA pending.  Patient got upset for some reason, states he wants to leave.  He would not explain why he no longer wanted the scan.  Patient requested his IV out, the nurse remove the IV and the patient left the emergency department.  Crissie Figures was evaluated in Emergency Department on 07/13/2020 for the symptoms described in the history of present illness. He was evaluated in the context of the global COVID-19 pandemic, which necessitated consideration that the patient might be at risk for infection with the SARS-CoV-2 virus that causes COVID-19. Institutional protocols and algorithms that pertain to the evaluation of patients at risk for COVID-19 are in a state of rapid change based on information released by regulatory bodies including the CDC and federal and state organizations. These policies and algorithms were followed during the patient's care in the ED.  ____________________________________________   FINAL CLINICAL IMPRESSION(S) / ED DIAGNOSES  Chest pain   Minna Antis, MD 07/13/20 2025

## 2020-07-13 NOTE — ED Notes (Signed)
First Nurse Note: Pt to ED via [facility] from home for chest pain that radiates into his back. Dx DVT/PE, has not been on blood thinners for the last month.  Pt given 324 mg of Aspirin 1 inch NTG paste last pressure 155/115- no hx/o HTN

## 2020-07-13 NOTE — ED Notes (Signed)
Patient refusing scan. Requesting IV out of arm. MD at bedside. IV removed.

## 2020-07-13 NOTE — ED Triage Notes (Signed)
Pt to ER with c/o midsternal chest pain that goes through to his back.  Pt states pain started approximately 1 hour ago.  Pt states hx of "blood clots" but has not been taking blood thinners for several months.

## 2020-07-13 NOTE — ED Notes (Addendum)
Patient ambulated off of unit without assistance or difficulty.

## 2021-01-09 ENCOUNTER — Emergency Department
Admission: EM | Admit: 2021-01-09 | Discharge: 2021-01-09 | Disposition: A | Discharge status: Home / Self Care | Payer: Self-pay | Attending: Emergency Medicine | Admitting: Emergency Medicine

## 2021-01-09 ENCOUNTER — Other Ambulatory Visit: Payer: Self-pay

## 2021-01-09 ENCOUNTER — Encounter: Payer: Self-pay | Admitting: Emergency Medicine

## 2021-01-09 ENCOUNTER — Emergency Department: Payer: Self-pay

## 2021-01-09 DIAGNOSIS — F17200 Nicotine dependence, unspecified, uncomplicated: Secondary | ICD-10-CM | POA: Insufficient documentation

## 2021-01-09 DIAGNOSIS — Z7901 Long term (current) use of anticoagulants: Secondary | ICD-10-CM | POA: Insufficient documentation

## 2021-01-09 DIAGNOSIS — M7731 Calcaneal spur, right foot: Secondary | ICD-10-CM | POA: Insufficient documentation

## 2021-01-09 DIAGNOSIS — Z86718 Personal history of other venous thrombosis and embolism: Secondary | ICD-10-CM | POA: Insufficient documentation

## 2021-01-09 DIAGNOSIS — M779 Enthesopathy, unspecified: Secondary | ICD-10-CM

## 2021-01-09 MED ORDER — TRAMADOL HCL 50 MG PO TABS
50.0000 mg | ORAL_TABLET | Freq: Once | ORAL | Status: AC
  Administered 2021-01-09: 50 mg via ORAL
  Filled 2021-01-09: qty 1

## 2021-01-09 MED ORDER — IBUPROFEN 600 MG PO TABS
600.0000 mg | ORAL_TABLET | Freq: Once | ORAL | Status: AC
  Administered 2021-01-09: 600 mg via ORAL
  Filled 2021-01-09: qty 1

## 2021-01-09 MED ORDER — IBUPROFEN 600 MG PO TABS
600.0000 mg | ORAL_TABLET | Freq: Four times a day (QID) | ORAL | 0 refills | Status: DC | PRN
Start: 2021-01-09 — End: 2021-01-22

## 2021-01-09 NOTE — ED Provider Notes (Signed)
[facility] Emergency Department Provider Note  ____________________________________________  Time seen: Approximately 2:40 AM  I have reviewed the triage vital signs and the nursing notes.   HISTORY  Chief Complaint Foot Pain   HPI Dalton Pena is a 40 y.o. male presents for evaluation of right foot pain.  Patient describes sharp severe intermittent stabbing pain located on the right lateral aspect of his calcaneus which is reproducible on palpation and worse with weightbearing.  Patient reports having this episode of similar pain in the past.  He has not been able to figure it out what brings the pain on but usually the pain lasts a couple of weeks and then resolves without intervention.  His only tried ice at home for the pain.  The pain sometimes happens at night and wakes him up from his sleep.  He denies any trauma, swelling, redness.  No prior surgeries on the foot.  Is not diabetic.   Past Medical History:  Diagnosis Date  . Deep vein thrombosis (DVT) (HCC)   . Pulmonary embolism [facility])     Patient Active Problem List   Diagnosis Date Noted  . Acute deep vein thrombosis (DVT) of right femoral vein (HCC) 03/15/2020  . 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 Obesity, Class III, BMI 40-49.9 (morbid obesity) (HCC) 03/15/2020  . Acute gastroenteritis 03/15/2020  . Acute pulmonary embolism (HCC) 03/15/2020  . Pulmonary embolism (HCC) 03/15/2020  . Acute deep vein thrombosis (DVT) of calf muscle vein of right lower extremity (HCC)     History reviewed. No pertinent surgical history.  Prior to Admission medications   Medication Sig Start Date End Date Taking? Authorizing Provider  ibuprofen (ADVIL) 600 MG tablet Take 1 tablet (600 mg total) by mouth every 6 (six) hours as needed. 01/09/21  Yes Don Perking, [place], MD  RIVAROXABAN Carlena Hurl) VTE STARTER PACK (15 & 20 MG TABLETS) Follow package directions: Take one 15mg  tablet by mouth twice a  day. On day 22, switch to one 20mg  tablet once a day. Take with food. 03/15/20   , MD    Allergies Patient has no known allergies.  No family history on file.  Social History Social History   Tobacco Use  . Smoking status: Current Every Day Smoker  . Smokeless tobacco: Never Used  Substance Use Topics  . Alcohol use: Yes  . Drug use: Not Currently    Review of Systems  Constitutional: Negative for fever. Eyes: Negative for visual changes. ENT: Negative for sore throat. Neck: No neck pain  Cardiovascular: Negative for chest pain. Respiratory: Negative for shortness of breath. Gastrointestinal: Negative for abdominal pain, vomiting or diarrhea. Genitourinary: Negative for dysuria. Musculoskeletal: Negative for back pain. + R foot pain Skin: Negative for rash. Neurological: Negative for headaches, weakness or numbness. Psych: No SI or HI  ____________________________________________   PHYSICAL EXAM:  VITAL SIGNS: ED Triage Vitals  Enc Vitals Group     BP 01/09/21 0154 111/72     Pulse Rate 01/09/21 0154 68     Resp 01/09/21 0154 18     Temp 01/09/21 0154 98.2 F (36.8 C)     Temp Source 01/09/21 0154 Oral     SpO2 01/09/21 0154 95 %     Weight 01/09/21 0159 290 lb (131.5 kg)     Height 01/09/21 0159 5\' 5"  (1.651 m)     Head Circumference --      Peak Flow --      Pain Score  01/09/21 0159 10     Pain Loc --      Pain Edu? --      Excl. in GC? --     Constitutional: Alert and oriented. Well appearing and in no apparent distress. HEENT:      Head: Normocephalic and atraumatic.         Eyes: Conjunctivae are normal. Sclera is non-icteric.       Mouth/Throat: Mucous membranes are moist.       Neck: Supple with no signs of meningismus. Cardiovascular: Regular rate and rhythm. 2+ symmetrical distal pulses are present in all extremities. Respiratory: Normal respiratory effort.  Musculoskeletal:  Patient is tender to palpation over the lateral  aspect of the R calcaneous, skin intact, no erythema/ warmth/ swelling, full painless ROM of the R ankle, no bruising or deformity, normal PT and DP pulses Neurologic: Normal speech and language. Face is symmetric. Moving all extremities. No gross focal neurologic deficits are appreciated. Skin: Skin is warm, dry and intact. No rash noted. Psychiatric: Mood and affect are normal. Speech and behavior are normal.  ____________________________________________   LABS (all labs ordered are listed, but only abnormal results are displayed)  Labs Reviewed - No data to display ____________________________________________  EKG  none  ____________________________________________  RADIOLOGY  I have personally reviewed the images performed during this visit and I agree with the Radiologist's read.   Interpretation by Radiologist:  DG Foot Complete Right  Result Date: 01/09/2021 CLINICAL DATA:  Acute onset sharp pain to the right heel for 2 weeks. No injury. EXAM: RIGHT FOOT COMPLETE - 3+ VIEW COMPARISON:  None. FINDINGS: Mild degenerative changes in the first metatarsal-phalangeal joint. Prominent Achilles calcaneal spur. No evidence of acute fracture or dislocation. No focal bone lesion or bone destruction. Bone cortex appears intact. Soft tissues are unremarkable. IMPRESSION: No acute bony abnormalities.  Prominent Achilles calcaneal spur. Electronically Signed   By: Burman Nieves M.D.   On: 01/09/2021 02:44      ____________________________________________   PROCEDURES  Procedure(s) performed: None Procedures Critical Care performed:  None ____________________________________________   INITIAL IMPRESSION / ASSESSMENT AND PLAN / ED COURSE  40 y.o. male presents for evaluation of recurrent stabbing, sharp pain located on the lateral aspect of the R calcaneous. No trauma.  On exam palpation of that area elicits tenderness but there is no obvious deformities, bruising, no signs of  cellulitis, no signs of ischemia, no signs of septic joint, no signs of foreign body, no signs of inflammatory arthritis.  Possibly arthritis versus neuropathy. XR of the foot showed a prominent calcaneal spur which is most likely the cause of patient's pain. Will refer patient to podiatry.  Old medical records reviewed showing no prior imaging of his foot.       _____________________________________________ Please note:  Patient was evaluated in Emergency Department today for the symptoms described in the history of present illness. Patient was evaluated in the context of the global COVID-19 pandemic, which necessitated consideration that the patient might be at risk for infection with the SARS-CoV-2 virus that causes COVID-19. Institutional protocols and algorithms that pertain to the evaluation of patients at risk for COVID-19 are in a state of rapid change based on information released by regulatory bodies including the CDC and federal and state organizations. These policies and algorithms were followed during the patient's care in the ED.  Some ED evaluations and interventions may be delayed as a result of limited staffing during the pandemic.   [place] Controlled  Substance Database was reviewed by me. ____________________________________________   FINAL CLINICAL IMPRESSION(S) / ED DIAGNOSES   Final diagnoses:  Bony spur      NEW MEDICATIONS STARTED DURING THIS VISIT:  ED Discharge Orders         Ordered    ibuprofen (ADVIL) 600 MG tablet  Every 6 hours PRN        01/09/21 0256           Note:  This document was prepared using Dragon voice recognition software and may include unintentional dictation errors.    Don Perking, [place], MD 01/09/21 [phone]

## 2021-01-09 NOTE — Discharge Instructions (Addendum)
As we discussed, your x-ray shows a large bony spur coming from your calcaneus bone which is most likely the source of your pain.  Use the hard soled shoe and crutches as needed.  Elevate the foot, apply ice, and take 600 mg of ibuprofen 3 times a day as needed for pain.  Follow-up with podiatry.  Return to the emergency room for redness, warmth, worsening pain, leg pain or swelling, ankle pain or swelling, or fever.

## 2021-01-09 NOTE — ED Triage Notes (Signed)
Pt to triage via w/c with no distress noted; reports awoke with sharp pain to rt heel; denies any known injury

## 2021-01-22 ENCOUNTER — Emergency Department
Admission: EM | Admit: 2021-01-22 | Discharge: 2021-01-22 | Disposition: A | Discharge status: Home / Self Care | Payer: 59 | Source: Home / Self Care | Attending: Emergency Medicine | Admitting: Emergency Medicine

## 2021-01-22 ENCOUNTER — Emergency Department: Payer: 59

## 2021-01-22 ENCOUNTER — Other Ambulatory Visit: Payer: Self-pay

## 2021-01-22 DIAGNOSIS — I10 Essential (primary) hypertension: Secondary | ICD-10-CM | POA: Insufficient documentation

## 2021-01-22 DIAGNOSIS — Z7901 Long term (current) use of anticoagulants: Secondary | ICD-10-CM | POA: Insufficient documentation

## 2021-01-22 DIAGNOSIS — F17200 Nicotine dependence, unspecified, uncomplicated: Secondary | ICD-10-CM | POA: Insufficient documentation

## 2021-01-22 DIAGNOSIS — R0602 Shortness of breath: Secondary | ICD-10-CM | POA: Diagnosis not present

## 2021-01-22 DIAGNOSIS — J189 Pneumonia, unspecified organism: Secondary | ICD-10-CM | POA: Diagnosis not present

## 2021-01-22 LAB — CBC WITH DIFFERENTIAL/PLATELET
Abs Immature Granulocytes: 0.04 10*3/uL (ref 0.00–0.07)
Basophils Absolute: 0.1 10*3/uL (ref 0.0–0.1)
Basophils Relative: 0 %
Eosinophils Absolute: 0.1 10*3/uL (ref 0.0–0.5)
Eosinophils Relative: 1 %
HCT: 45.6 % (ref 39.0–52.0)
Hemoglobin: 15.3 g/dL (ref 13.0–17.0)
Immature Granulocytes: 0 %
Lymphocytes Relative: 11 %
Lymphs Abs: 1.5 10*3/uL (ref 0.7–4.0)
MCH: 32.7 pg (ref 26.0–34.0)
MCHC: 33.6 g/dL (ref 30.0–36.0)
MCV: 97.4 fL (ref 80.0–100.0)
Monocytes Absolute: 0.8 10*3/uL (ref 0.1–1.0)
Monocytes Relative: 6 %
Neutro Abs: 10.7 10*3/uL — ABNORMAL HIGH (ref 1.7–7.7)
Neutrophils Relative %: 82 %
Platelets: 278 10*3/uL (ref 150–400)
RBC: 4.68 MIL/uL (ref 4.22–5.81)
RDW: 14.1 % (ref 11.5–15.5)
WBC: 13.2 10*3/uL — ABNORMAL HIGH (ref 4.0–10.5)
nRBC: 0 % (ref 0.0–0.2)

## 2021-01-22 LAB — BASIC METABOLIC PANEL
Anion gap: 9 (ref 5–15)
BUN: 14 mg/dL (ref 6–20)
CO2: 26 mmol/L (ref 22–32)
Calcium: 8.6 mg/dL — ABNORMAL LOW (ref 8.9–10.3)
Chloride: 100 mmol/L (ref 98–111)
Creatinine, Ser: 1.08 mg/dL (ref 0.61–1.24)
GFR, Estimated: >60 mL/min (ref >60)
Glucose, Bld: 109 mg/dL — ABNORMAL HIGH (ref 70–99)
Potassium: 3.9 mmol/L (ref 3.5–5.1)
Sodium: 135 mmol/L (ref 135–145)

## 2021-01-22 LAB — TROPONIN I (HIGH SENSITIVITY): Troponin I (High Sensitivity): 7 ng/L (ref <18)

## 2021-01-22 MED ORDER — SODIUM CHLORIDE 0.9 % IV BOLUS (SEPSIS)
1000.0000 mL | Freq: Once | INTRAVENOUS | Status: AC
  Administered 2021-01-22: 1000 mL via INTRAVENOUS

## 2021-01-22 MED ORDER — HYDROCOD POLST-CPM POLST ER 10-8 MG/5ML PO SUER: 5.0000 mL | Freq: Two times a day (BID) | ORAL | 0 refills | Status: DC | PRN

## 2021-01-22 MED ORDER — RIVAROXABAN 20 MG PO TABS: 20.0000 mg | ORAL_TABLET | Freq: Every day | ORAL | 0 refills | Status: DC

## 2021-01-22 MED ORDER — ACETAMINOPHEN 500 MG PO TABS
1000.0000 mg | ORAL_TABLET | Freq: Once | ORAL | Status: AC
  Administered 2021-01-22: 1000 mg via ORAL
  Filled 2021-01-22: qty 2

## 2021-01-22 MED ORDER — OXYMETAZOLINE HCL 0.05 % NA SOLN
1.0000 | Freq: Once | NASAL | Status: DC
  Filled 2021-01-22: qty 30

## 2021-01-22 MED ORDER — HYDROCOD POLST-CPM POLST ER 10-8 MG/5ML PO SUER
5.0000 mL | Freq: Once | ORAL | Status: AC
Start: 2021-01-22 — End: 2021-01-22
  Administered 2021-01-22: 5 mL via ORAL
  Filled 2021-01-22: qty 5

## 2021-01-22 NOTE — Discharge Instructions (Addendum)
You may alternate Tylenol 1000 mg every 6 hours as needed for pain, fever and Ibuprofen 800 mg every 8 hours as needed for pain, fever.  Please take Ibuprofen with food.  Do not take more than 4000 mg of Tylenol (acetaminophen) in a 24 hour period.  Please continue your doxycycline as prescribed for pneumonia.  I recommend that you resume taking Xarelto for your history of PE and DVT.  You may alternate Tylenol 1000 mg every 6 hours as needed for pain, fever and Ibuprofen 800 mg every 8 hours as needed for pain, fever.  Please take Ibuprofen with food.  Do not take more than 4000 mg of Tylenol (acetaminophen) in a 24 hour period.  I recommend you follow-up with a primary care physician as I suspect that you have sleep apnea and will need an outpatient sleep study.   Steps to find a Primary Care Provider (PCP):  Call [phone] or [phone] to access " Find a Doctor Service."  2.  You may also go on the [facility] website at [email or web address]

## 2021-01-22 NOTE — ED Notes (Signed)
Ambulated patient with pulse oxymeter. Initial O2 sat upon standing 94%. Pt ambulated approx 30 meters. O2 sat dropped to 92% at beginning and rose to 96% while walking. Dr Elesa Massed notified of results.

## 2021-01-22 NOTE — ED Provider Notes (Signed)
[facility] Emergency Department Provider Note  ____________________________________________   Event Date/Time   First MD Initiated Contact with Patient 01/22/21 0102     (approximate)  I have reviewed the triage vital signs and the nursing notes.   HISTORY  Chief Complaint Fever and Hypertension    HPI Dalton Pena is a 40 y.o. male with history of PE and DVT who presents emergency department with complaints of fevers, cough, chest pain that has resolved, shortness of breath, diffuse abdominal pain from coughing.  No nausea, vomiting or diarrhea.  He was seen at [facility] yesterday and had a CTA of his chest that showed no PE but did show tree in bud airspace opacities and he was discharged on doxycycline.  States he is only taken 1 dose of this medication.  His COVID, flu and RSV test were negative.  He had 2 normal, flat troponins at [facility].  He reports spiking a fever at home tonight which concerned him.  States it was high as 104.  He did not take any antipyretics prior to arrival.  Also reports taking Tessalon Perles without much relief.        Past Medical History:  Diagnosis Date  . Deep vein thrombosis (DVT) (HCC)   . Pulmonary embolism [facility])     Patient Active Problem List   Diagnosis Date Noted  . Acute deep vein thrombosis (DVT) of right femoral vein (HCC) 03/15/2020  . 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 Obesity, Class III, BMI 40-49.9 (morbid obesity) (HCC) 03/15/2020  . Acute gastroenteritis 03/15/2020  . Acute pulmonary embolism (HCC) 03/15/2020  . Pulmonary embolism (HCC) 03/15/2020  . Acute deep vein thrombosis (DVT) of calf muscle vein of right lower extremity (HCC)     History reviewed. No pertinent surgical history.  Prior to Admission medications   Medication Sig Start Date End Date Taking? Authorizing Provider  chlorpheniramine-HYDROcodone (TUSSIONEX PENNKINETIC ER) 10-8 MG/5ML SUER Take 5 mLs by mouth every  12 (twelve) hours as needed for cough. 01/22/21  Yes Maura Braaten, Layla Maw, DO  rivaroxaban (XARELTO) 20 MG TABS tablet Take 1 tablet (20 mg total) by mouth daily with supper. 01/22/21  Yes Khaidyn Staebell, Layla Maw, DO    Allergies Patient has no known allergies.  No family history on file.  Social History Social History   Tobacco Use  . Smoking status: Current Every Day Smoker  . Smokeless tobacco: Never Used  Substance Use Topics  . Alcohol use: Yes  . Drug use: Not Currently    Review of Systems Constitutional: + fever. Eyes: No visual changes. ENT: No sore throat. Cardiovascular: +chest pain. Respiratory: + shortness of breath. Gastrointestinal: No nausea, vomiting, diarrhea. Genitourinary: Negative for dysuria. Musculoskeletal: Negative for back pain. Skin: Negative for rash. Neurological: Negative for focal weakness or numbness.  ____________________________________________   PHYSICAL EXAM:  VITAL SIGNS: ED Triage Vitals [01/22/21 0102]  Enc Vitals Group     BP (!) 183/76     Pulse Rate 90     Resp 20     Temp 99.4 F (37.4 C)     Temp Source Oral     SpO2 99 %     Weight 280 lb (127 kg)     Height 5\' 5"  (1.651 m)     Head Circumference      Peak Flow      Pain Score 0     Pain Loc      Pain Edu?  Excl. in GC?    CONSTITUTIONAL: Alert and oriented and responds appropriately to questions. Well-appearing; well-nourished, nontoxic-appearing HEAD: Normocephalic EYES: Conjunctivae clear, pupils appear equal, EOM appear intact ENT: normal nose; moist mucous membranes, nasal congestion NECK: Supple, normal ROM, no meningismus CARD: RRR; S1 and S2 appreciated; no murmurs, no clicks, no rubs, no gallops RESP: Normal chest excursion without splinting or tachypnea; breath sounds clear and equal bilaterally; no wheezes, no rhonchi, no rales, no hypoxia or respiratory distress, speaking full sentences ABD/GI: Normal bowel sounds; non-distended; soft, non-tender, no rebound,  no guarding, no peritoneal signs, no hepatosplenomegaly BACK: The back appears normal EXT: Normal ROM in all joints; no deformity noted, no edema; no cyanosis, no calf tenderness or calf swelling SKIN: Normal color for age and race; warm; no rash on exposed skin NEURO: Moves all extremities equally PSYCH: The patient's mood and manner are appropriate.  ____________________________________________   LABS (all labs ordered are listed, but only abnormal results are displayed)  Labs Reviewed  CBC WITH DIFFERENTIAL/PLATELET - Abnormal; Notable for the following components:      Result Value   WBC 13.2 (*)    Neutro Abs 10.7 (*)    All other components within normal limits  BASIC METABOLIC PANEL - Abnormal; Notable for the following components:   Glucose, Bld 109 (*)    Calcium 8.6 (*)    All other components within normal limits  TROPONIN I (HIGH SENSITIVITY)   ____________________________________________  EKG   EKG Interpretation  Date/Time:  Tuesday January 22 2021 01:53:38 EDT Ventricular Rate:  83 PR Interval:  160 QRS Duration: 85 QT Interval:  355 QTC Calculation: 418 R Axis:   24 Text Interpretation: Sinus rhythm Confirmed by Rochele Raring [phone]) on 01/22/2021 2:15:49 AM       ____________________________________________  RADIOLOGY Normajean Baxter Nikiah Goin, personally viewed and evaluated these images (plain radiographs) as part of my medical decision making, as well as reviewing the written report by the radiologist.  ED MD interpretation: Chest x-ray shows bronchitic changes.  No obvious infiltrate or edema.  Official radiology report(s): DG Chest 2 View  Result Date: 01/22/2021 CLINICAL DATA:  Shortness of breath, cough EXAM: CHEST - 2 VIEW COMPARISON:  07/13/2020 FINDINGS: Peribronchial thickening. Heart and mediastinal contours are within normal limits. No focal opacities or effusions. No acute bony abnormality. IMPRESSION: Bronchitic changes. Electronically Signed    By: Charlett Nose M.D.   On: 01/22/2021 01:55    ____________________________________________   PROCEDURES  Procedure(s) performed (including Critical Care):  Procedures    ____________________________________________   INITIAL IMPRESSION / ASSESSMENT AND PLAN / ED COURSE  As part of my medical decision making, I reviewed the following data within the electronic MEDICAL RECORD NUMBER History obtained from family, Nursing notes reviewed and incorporated, Labs reviewed , EKG interpreted , Old EKG reviewed, Old chart reviewed, Radiograph reviewed , Notes from prior ED visits and [place] Controlled Substance Database         Patient here with complaints of fevers, cough, congestion, chest pain that has resolved, shortness of breath.  Had a complete work-up at [facility] emergency department yesterday and was diagnosed with pneumonia and started on doxycycline.  COVID, flu and RSV negative.  CTA showed no PE.  Reports he became concerned tonight when he became febrile.  He did not take any Tylenol or ibuprofen prior to arrival.  Temperature is now 99.4.  He has continuous dry, hacking cough in the room.  No hypoxia, respiratory distress, increased work of  breathing.  Suspect symptoms are secondary to pneumonia but will repeat EKG, labs, chest x-ray to ensure there is no significant change today.  Low suspicion for ACS, PE, dissection.  Will give Tylenol, Tussionex, Afrin for symptomatic relief.  ED PROGRESS  Patient's labs show leukocytosis of 13,000.  He has spiked a fever here.  Receiving Tylenol and IV fluids.  There was documentation at [facility] that he did have desaturation when he was lying flat, sleeping.  They suspected sleep apnea and recommended follow-up for a sleep study.  We will ambulate here off of oxygen to ensure no desaturation.  Currently satting 97% on room air at rest.   Patient able to ambulate throughout the department and his sats stayed at 96% or above.  No shortness of breath or creased  work of breathing.  I have reiterated to him that his oxygen saturations drop when he is sleeping and he will need to follow-up with her primary care doctor for an outpatient sleep study.  This was also told to him at [facility] yesterday.  We will give outpatient follow-up information.  He is also supposed to be on Xarelto due to his history of PE, DVT.  We will give him another prescription of this medication.  I have recommended he continue doxycycline as prescribed yesterday as I feel this is appropriate for community-acquired pneumonia.  Discussed return precautions.  Will prescribe Tussionex to help with his cough.  Patient and wife are comfortable with this plan and verbalized understanding.   At this time, I do not feel there is any life-threatening condition present. I have reviewed, interpreted and discussed all results (EKG, imaging, lab, urine as appropriate) and exam findings with patient/family. I have reviewed nursing notes and appropriate previous records.  I feel the patient is safe to be discharged home without further emergent workup and can continue workup as an outpatient as needed. Discussed usual and customary return precautions. Patient/family verbalize understanding and are comfortable with this plan.  Outpatient follow-up has been provided as needed. All questions have been answered.  ____________________________________________   FINAL CLINICAL IMPRESSION(S) / ED DIAGNOSES  Final diagnoses:  Community acquired pneumonia, bilateral     ED Discharge Orders         Ordered    rivaroxaban (XARELTO) 20 MG TABS tablet  Daily with supper        01/22/21 0455    chlorpheniramine-HYDROcodone (TUSSIONEX PENNKINETIC ER) 10-8 MG/5ML SUER  Every 12 hours PRN        01/22/21 0455          *Please note:  Dalton Pena was evaluated in Emergency Department on 01/22/2021 for the symptoms described in the history of present illness. He was evaluated in the context of the global  COVID-19 pandemic, which necessitated consideration that the patient might be at risk for infection with the SARS-CoV-2 virus that causes COVID-19. Institutional protocols and algorithms that pertain to the evaluation of patients at risk for COVID-19 are in a state of rapid change based on information released by regulatory bodies including the CDC and federal and state organizations. These policies and algorithms were followed during the patient's care in the ED.  Some ED evaluations and interventions may be delayed as a result of limited staffing during and the pandemic.*   Note:  This document was prepared using Dragon voice recognition software and may include unintentional dictation errors.   Drae Mitzel, Layla Maw, DO 01/22/21 [phone]

## 2021-01-22 NOTE — ED Triage Notes (Signed)
Pt states he was seen at [facility] for a cough and began taking benzonate, pt states since he has had high blood pressure and c/o a fever.

## 2021-01-24 ENCOUNTER — Emergency Department: Payer: 59

## 2021-01-24 ENCOUNTER — Other Ambulatory Visit: Payer: Self-pay

## 2021-01-24 ENCOUNTER — Inpatient Hospital Stay
Admission: EM | Admit: 2021-01-24 | Discharge: 2021-01-26 | DRG: 193 | Disposition: A | Discharge status: Home / Self Care | Payer: 59 | Source: Ambulatory Visit | Attending: Internal Medicine | Admitting: Internal Medicine

## 2021-01-24 DIAGNOSIS — Z86718 Personal history of other venous thrombosis and embolism: Secondary | ICD-10-CM | POA: Diagnosis not present

## 2021-01-24 DIAGNOSIS — I2699 Other pulmonary embolism without acute cor pulmonale: Secondary | ICD-10-CM | POA: Diagnosis present

## 2021-01-24 DIAGNOSIS — R739 Hyperglycemia, unspecified: Secondary | ICD-10-CM | POA: Diagnosis present

## 2021-01-24 DIAGNOSIS — I2782 Chronic pulmonary embolism: Secondary | ICD-10-CM | POA: Diagnosis not present

## 2021-01-24 DIAGNOSIS — E66813 Obesity, class 3: Secondary | ICD-10-CM | POA: Diagnosis present

## 2021-01-24 DIAGNOSIS — Z720 Tobacco use: Secondary | ICD-10-CM | POA: Diagnosis not present

## 2021-01-24 DIAGNOSIS — J441 Chronic obstructive pulmonary disease with (acute) exacerbation: Secondary | ICD-10-CM | POA: Diagnosis not present

## 2021-01-24 DIAGNOSIS — Z20822 Contact with and (suspected) exposure to covid-19: Secondary | ICD-10-CM | POA: Diagnosis present

## 2021-01-24 DIAGNOSIS — R079 Chest pain, unspecified: Secondary | ICD-10-CM | POA: Diagnosis present

## 2021-01-24 DIAGNOSIS — N179 Acute kidney failure, unspecified: Secondary | ICD-10-CM | POA: Diagnosis present

## 2021-01-24 DIAGNOSIS — R112 Nausea with vomiting, unspecified: Secondary | ICD-10-CM | POA: Diagnosis present

## 2021-01-24 DIAGNOSIS — Z6841 Body Mass Index (BMI) 40.0 and over, adult: Secondary | ICD-10-CM

## 2021-01-24 DIAGNOSIS — I82411 Acute embolism and thrombosis of right femoral vein: Secondary | ICD-10-CM | POA: Diagnosis present

## 2021-01-24 DIAGNOSIS — Z86711 Personal history of pulmonary embolism: Secondary | ICD-10-CM

## 2021-01-24 DIAGNOSIS — R109 Unspecified abdominal pain: Secondary | ICD-10-CM | POA: Diagnosis present

## 2021-01-24 DIAGNOSIS — G4733 Obstructive sleep apnea (adult) (pediatric): Secondary | ICD-10-CM | POA: Diagnosis present

## 2021-01-24 DIAGNOSIS — T380X5A Adverse effect of glucocorticoids and synthetic analogues, initial encounter: Secondary | ICD-10-CM | POA: Diagnosis present

## 2021-01-24 DIAGNOSIS — J189 Pneumonia, unspecified organism: Principal | ICD-10-CM | POA: Diagnosis present

## 2021-01-24 DIAGNOSIS — F17210 Nicotine dependence, cigarettes, uncomplicated: Secondary | ICD-10-CM | POA: Diagnosis present

## 2021-01-24 DIAGNOSIS — J9601 Acute respiratory failure with hypoxia: Secondary | ICD-10-CM | POA: Diagnosis present

## 2021-01-24 DIAGNOSIS — Z8249 Family history of ischemic heart disease and other diseases of the circulatory system: Secondary | ICD-10-CM | POA: Diagnosis not present

## 2021-01-24 DIAGNOSIS — J440 Chronic obstructive pulmonary disease with acute lower respiratory infection: Secondary | ICD-10-CM | POA: Diagnosis present

## 2021-01-24 DIAGNOSIS — R0602 Shortness of breath: Secondary | ICD-10-CM | POA: Diagnosis present

## 2021-01-24 DIAGNOSIS — J449 Chronic obstructive pulmonary disease, unspecified: Secondary | ICD-10-CM | POA: Diagnosis present

## 2021-01-24 DIAGNOSIS — R0789 Other chest pain: Secondary | ICD-10-CM | POA: Diagnosis not present

## 2021-01-24 LAB — CBC WITH DIFFERENTIAL/PLATELET
Abs Immature Granulocytes: 0.04 10*3/uL (ref 0.00–0.07)
Basophils Absolute: 0 10*3/uL (ref 0.0–0.1)
Basophils Relative: 0 %
Eosinophils Absolute: 0.2 10*3/uL (ref 0.0–0.5)
Eosinophils Relative: 2 %
HCT: 46.3 % (ref 39.0–52.0)
Hemoglobin: 15.7 g/dL (ref 13.0–17.0)
Immature Granulocytes: 0 %
Lymphocytes Relative: 26 %
Lymphs Abs: 3.1 10*3/uL (ref 0.7–4.0)
MCH: 32.5 pg (ref 26.0–34.0)
MCHC: 33.9 g/dL (ref 30.0–36.0)
MCV: 95.9 fL (ref 80.0–100.0)
Monocytes Absolute: 0.8 10*3/uL (ref 0.1–1.0)
Monocytes Relative: 7 %
Neutro Abs: 7.8 10*3/uL — ABNORMAL HIGH (ref 1.7–7.7)
Neutrophils Relative %: 65 %
Platelets: 260 10*3/uL (ref 150–400)
RBC: 4.83 MIL/uL (ref 4.22–5.81)
RDW: 13.3 % (ref 11.5–15.5)
WBC: 12.1 10*3/uL — ABNORMAL HIGH (ref 4.0–10.5)
nRBC: 0 % (ref 0.0–0.2)

## 2021-01-24 LAB — PROTIME-INR
INR: 1 (ref 0.8–1.2)
Prothrombin Time: 13.2 seconds (ref 11.4–15.2)

## 2021-01-24 LAB — TROPONIN I (HIGH SENSITIVITY)
Troponin I (High Sensitivity): 8 ng/L (ref <18)
Troponin I (High Sensitivity): 7 ng/L (ref <18)
Troponin I (High Sensitivity): 5 ng/L (ref <18)
Troponin I (High Sensitivity): 4 ng/L (ref <18)
Troponin I (High Sensitivity): 4 ng/L (ref <18)

## 2021-01-24 LAB — COMPREHENSIVE METABOLIC PANEL
ALT: 26 U/L (ref 0–44)
AST: 26 U/L (ref 15–41)
Albumin: 3.6 g/dL (ref 3.5–5.0)
Alkaline Phosphatase: 46 U/L (ref 38–126)
Anion gap: 9 (ref 5–15)
BUN: 15 mg/dL (ref 6–20)
CO2: 24 mmol/L (ref 22–32)
Calcium: 8.4 mg/dL — ABNORMAL LOW (ref 8.9–10.3)
Chloride: 103 mmol/L (ref 98–111)
Creatinine, Ser: 1.29 mg/dL — ABNORMAL HIGH (ref 0.61–1.24)
GFR, Estimated: >60 mL/min (ref >60)
Glucose, Bld: 137 mg/dL — ABNORMAL HIGH (ref 70–99)
Potassium: 3.6 mmol/L (ref 3.5–5.1)
Sodium: 136 mmol/L (ref 135–145)
Total Bilirubin: 0.8 mg/dL (ref 0.3–1.2)
Total Protein: 7.5 g/dL (ref 6.5–8.1)

## 2021-01-24 LAB — BRAIN NATRIURETIC PEPTIDE: B Natriuretic Peptide: 10.3 pg/mL (ref 0.0–100.0)

## 2021-01-24 LAB — LIPASE, BLOOD: Lipase: 24 U/L (ref 11–51)

## 2021-01-24 LAB — RESP PANEL BY RT-PCR (FLU A&B, COVID) ARPGX2
Influenza A by PCR: NEGATIVE
Influenza B by PCR: NEGATIVE
SARS Coronavirus 2 by RT PCR: NEGATIVE

## 2021-01-24 LAB — LACTIC ACID, PLASMA: Lactic Acid, Venous: 0.8 mmol/L (ref 0.5–1.9)

## 2021-01-24 LAB — APTT: aPTT: 27 seconds (ref 24–36)

## 2021-01-24 LAB — HIV ANTIBODY (ROUTINE TESTING W REFLEX): HIV Screen 4th Generation wRfx: NONREACTIVE

## 2021-01-24 MED ORDER — IPRATROPIUM-ALBUTEROL 0.5-2.5 (3) MG/3ML IN SOLN
3.0000 mL | Freq: Once | RESPIRATORY_TRACT | Status: AC
  Administered 2021-01-24: 3 mL via RESPIRATORY_TRACT
  Filled 2021-01-24: qty 3

## 2021-01-24 MED ORDER — MORPHINE SULFATE (PF) 2 MG/ML IV SOLN: 2.0000 mg | INTRAVENOUS | Status: DC | PRN

## 2021-01-24 MED ORDER — ACETAMINOPHEN 325 MG PO TABS: 650.0000 mg | ORAL_TABLET | Freq: Four times a day (QID) | ORAL | Status: DC | PRN

## 2021-01-24 MED ORDER — ONDANSETRON HCL 4 MG/2ML IJ SOLN
4.0000 mg | Freq: Once | INTRAMUSCULAR | Status: AC
  Administered 2021-01-24: 4 mg via INTRAVENOUS
  Filled 2021-01-24: qty 2

## 2021-01-24 MED ORDER — ONDANSETRON HCL 4 MG/2ML IJ SOLN
4.0000 mg | Freq: Three times a day (TID) | INTRAMUSCULAR | Status: DC | PRN
Start: 2021-01-24 — End: 2021-01-26

## 2021-01-24 MED ORDER — IPRATROPIUM-ALBUTEROL 0.5-2.5 (3) MG/3ML IN SOLN
3.0000 mL | RESPIRATORY_TRACT | Status: DC
  Administered 2021-01-24 – 2021-01-25 (×5): 3 mL via RESPIRATORY_TRACT
  Filled 2021-01-24 (×5): qty 3

## 2021-01-24 MED ORDER — NICOTINE 21 MG/24HR TD PT24
21.0000 mg | MEDICATED_PATCH | Freq: Every day | TRANSDERMAL | Status: DC
  Administered 2021-01-24 – 2021-01-25 (×2): 21 mg via TRANSDERMAL
  Filled 2021-01-24 (×2): qty 1

## 2021-01-24 MED ORDER — METHYLPREDNISOLONE SODIUM SUCC 125 MG IJ SOLR
125.0000 mg | Freq: Once | INTRAMUSCULAR | Status: AC
  Administered 2021-01-24: 125 mg via INTRAVENOUS
  Filled 2021-01-24: qty 2

## 2021-01-24 MED ORDER — METHYLPREDNISOLONE SODIUM SUCC 125 MG IJ SOLR
60.0000 mg | Freq: Two times a day (BID) | INTRAMUSCULAR | Status: DC
  Administered 2021-01-24 – 2021-01-26 (×4): 60 mg via INTRAVENOUS
  Filled 2021-01-24 (×4): qty 2

## 2021-01-24 MED ORDER — RIVAROXABAN 20 MG PO TABS
20.0000 mg | ORAL_TABLET | Freq: Every day | ORAL | Status: DC
  Administered 2021-01-24 – 2021-01-25 (×2): 20 mg via ORAL
  Filled 2021-01-24 (×4): qty 1

## 2021-01-24 MED ORDER — DM-GUAIFENESIN ER 30-600 MG PO TB12: 1.0000 | ORAL_TABLET | Freq: Two times a day (BID) | ORAL | Status: DC | PRN

## 2021-01-24 MED ORDER — ALBUTEROL SULFATE (2.5 MG/3ML) 0.083% IN NEBU: 2.5000 mg | INHALATION_SOLUTION | Freq: Once | RESPIRATORY_TRACT | Status: DC

## 2021-01-24 MED ORDER — ALBUTEROL SULFATE (2.5 MG/3ML) 0.083% IN NEBU
2.5000 mg | INHALATION_SOLUTION | RESPIRATORY_TRACT | Status: DC | PRN
  Administered 2021-01-26: 2.5 mg via RESPIRATORY_TRACT
  Filled 2021-01-24: qty 3

## 2021-01-24 NOTE — H&P (Signed)
History and Physical    ADA WOODBURY MGQ:[id number] DOB: May 16, 1981 DOA: 01/24/2021  Referring MD/NP/PA:   PCP: Pcp, No   Patient coming from:  The patient is coming from home.  At baseline, pt is independent for most of ADL.        Chief Complaint: Shortness of breath, cough, chest pain, nausea, vomiting, diarrhea  HPI: Dalton Pena is a 40 y.o. 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  Patient states that he has been having shortness of breath for for more than 1 week, particularly on exertion.  He has cough with some mucus production.  He also reports chest pain, which is located in the whole front chest, constant, 8 out of 10 in severity, sharp, nonradiating.  Patient cannot give detailed information to further characterize the nature of his chest pain.  Patient was seen in [facility] clinic on 01/21/2021.  He had CT angiogram which was negative for PE, but showed "Scattered areas of tree-in-bud opacities within the right upper and lower lobes as well as scattered diffuse groundglass nodular opacities".  Patient was started on doxycycline, no significant improvement. He was seen in ED again in 4/19 and was instructed to continue doxycycline.  Pt was reportedly to have hypoxia when sleeping and thought to have sleep apnea.   Patient also reports nausea, vomiting, diarrhea and abdominal pain.  Patient stated he had 1 or 2 loose stool bowel movement today.  He vomited several times which was nonbilious and nonbloody.  He also reports abdominal pain which is diffuse, sharp, 7 out of 10 in severity, nonradiating.  Denies symptoms of UTI.  No hematuria.  Patient is not taking Xarelto currently.  Patient had oxygen desaturation to 86% on room air initially, currently 90-100% on 2 L oxygen in ED  CTA on 01/21/21: -- No definitive pulmonary embolism as  clinically questioned.  -- Scattered areas of tree-in-bud opacities within the right upper and lower lobes as well as scattered diffuse groundglass nodular opacities. New bilateral hilar lymphadenopathy, right greater than left. Findings may represent infectious/inflammatory process with reactive lymphadenopathy. Recommend follow-up chest CT in approximately 6-8 weeks to assess for resolution.  ED Course: pt was found to have WBC 12.1, lactic acid of 0.8, INR 1.0, PTT 27, troponin level 8, 7, negative COVID PCR, GFR> 60, temperature normal, blood pressure 127/70, heart rate 55, RR 26, 24, chest x-ray negative.  Patient is admitted to MedSurg bed as inpatient.  Review of Systems:   General: no fevers, chills, no body weight gain, has poor appetite, has fatigue HEENT: no blurry vision, hearing changes or sore throat Respiratory: has dyspnea, coughing, wheezing CV: has chest pain, no palpitations GI: has nausea, vomiting, abdominal pain, diarrhea, no constipation GU: no dysuria, burning on urination, increased urinary frequency, hematuria  Ext: no leg edema Neuro: no unilateral weakness, numbness, or tingling, no vision change or hearing loss Skin: no rash, no skin tear. MSK: No muscle spasm, no deformity, no limitation of range of movement in spin Heme: No easy bruising.  Travel history: No recent long distant travel.  Allergy: No Known Allergies  Past Medical History:  Diagnosis Date  . Deep vein thrombosis (DVT) (HCC)   . Pulmonary embolism (HCC)     History reviewed. No pertinent surgical history.  Social History:  reports that he has been smoking. He has never used smokeless tobacco.  He reports current alcohol use. He reports previous drug use.  Family History:  Family History  Problem Relation Age of Onset  . Heart failure Father      Prior to Admission medications   Medication Sig Start Date End Date Taking? Authorizing Provider  chlorpheniramine-HYDROcodone (TUSSIONEX  PENNKINETIC ER) 10-8 MG/5ML SUER Take 5 mLs by mouth every 12 (twelve) hours as needed for cough. 01/22/21   Ward, Layla Maw, DO  rivaroxaban (XARELTO) 20 MG TABS tablet Take 1 tablet (20 mg total) by mouth daily with supper. 01/22/21   Ward, Layla Maw, DO    Physical Exam: Vitals:   01/24/21 1315 01/24/21 1400 01/24/21 1436 01/24/21 1618  BP:  114/64 118/89 (!) 110/50  Pulse: (!) 59 (!) 58 61 68  Resp: (!) 22 15 18 16   Temp:   98 F (36.7 C) 98.4 F (36.9 C)  TempSrc:   Oral Oral  SpO2: 100% 95% 98% 97%  Weight:      Height:       General: Not in acute distress HEENT:       Eyes: PERRL, EOMI, no scleral icterus.       ENT: No discharge from the ears and nose, no pharynx injection, no tonsillar enlargement.        Neck: No JVD, no bruit, no mass felt. Heme: No neck lymph node enlargement. Cardiac: S1/S2, RRR, No murmurs, No gallops or rubs. Respiratory: Has wheezing bilaterally GI: Soft, nondistended, has diffused tenderness, no rebound pain, no organomegaly, BS present. GU: No hematuria Ext: No pitting leg edema bilaterally. 1+DP/PT pulse bilaterally. Musculoskeletal: No joint deformities, No joint redness or warmth, no limitation of ROM in spin. Skin: No rashes.  Neuro: Alert, oriented X3, cranial nerves II-XII grossly intact, moves all extremities normally. Psych: Patient is not psychotic, no suicidal or hemocidal ideation.  Labs on Admission: I have personally reviewed following labs and imaging studies  CBC: Recent Labs  Lab 01/22/21 0212 01/24/21 0841  WBC 13.2* 12.1*  NEUTROABS 10.7* 7.8*  HGB 15.3 15.7  HCT 45.6 46.3  MCV 97.4 95.9  PLT 278 260   Basic Metabolic Panel: Recent Labs  Lab 01/22/21 0212 01/24/21 0841  NA 135 136  K 3.9 3.6  CL 100 103  CO2 26 24  GLUCOSE 109* 137*  BUN 14 15  CREATININE 1.08 1.29*  CALCIUM 8.6* 8.4*   GFR: Estimated Creatinine Clearance: 97.9 mL/min (A) (by C-G formula based on SCr of 1.29 mg/dL (H)). Liver Function  Tests: Recent Labs  Lab 01/24/21 0841  AST 26  ALT 26  ALKPHOS 46  BILITOT 0.8  PROT 7.5  ALBUMIN 3.6   Recent Labs  Lab 01/24/21 1032  LIPASE 24   No results for input(s): AMMONIA in the last 168 hours. Coagulation Profile: Recent Labs  Lab 01/24/21 0841  INR 1.0   Cardiac Enzymes: No results for input(s): CKTOTAL, CKMB, CKMBINDEX, TROPONINI in the last 168 hours. BNP (last 3 results) No results for input(s): PROBNP in the last 8760 hours. HbA1C: No results for input(s): HGBA1C in the last 72 hours. CBG: No results for input(s): GLUCAP in the last 168 hours. Lipid Profile: No results for input(s): CHOL, HDL, LDLCALC, TRIG, CHOLHDL, LDLDIRECT in the last 72 hours. Thyroid Function Tests: No results for input(s): TSH, T4TOTAL, FREET4, T3FREE, THYROIDAB in the last 72 hours. Anemia Panel: No results for input(s): VITAMINB12, FOLATE, FERRITIN, TIBC, IRON, RETICCTPCT in the last 72 hours. Urine analysis:    Component Value  Date/Time   COLORURINE YELLOW (A) 03/14/2020 1945   APPEARANCEUR HAZY (A) 03/14/2020 1945   LABSPEC 1.028 03/14/2020 1945   PHURINE 5.0 03/14/2020 1945   GLUCOSEU NEGATIVE 03/14/2020 1945   HGBUR NEGATIVE 03/14/2020 1945   BILIRUBINUR NEGATIVE 03/14/2020 1945   KETONESUR NEGATIVE 03/14/2020 1945   PROTEINUR NEGATIVE 03/14/2020 1945   NITRITE NEGATIVE 03/14/2020 1945   LEUKOCYTESUR NEGATIVE 03/14/2020 1945   Sepsis Labs: @LABRCNTIP (procalcitonin:4,lacticidven:4) ) Recent Results (from the past 240 hour(s))  Resp Panel by RT-PCR (Flu A&B, Covid) Nasopharyngeal Swab     Status: None   Collection Time: 01/24/21  8:40 AM   Specimen: Nasopharyngeal Swab; Nasopharyngeal(NP) swabs in vial transport medium  Result Value Ref Range Status   SARS Coronavirus 2 by RT PCR NEGATIVE NEGATIVE Final    Comment: (NOTE) SARS-CoV-2 target nucleic acids are NOT DETECTED.  The SARS-CoV-2 RNA is generally detectable in upper respiratory specimens during the  acute phase of infection. The lowest concentration of SARS-CoV-2 viral copies this assay can detect is 138 copies/mL. A negative result does not preclude SARS-Cov-2 infection and should not be used as the sole basis for treatment or other patient management decisions. A negative result may occur with  improper specimen collection/handling, submission of specimen other than nasopharyngeal swab, presence of viral mutation(s) within the areas targeted by this assay, and inadequate number of viral copies(<138 copies/mL). A negative result must be combined with clinical observations, patient history, and epidemiological information. The expected result is Negative.  Fact Sheet for Patients:  01/26/21  Fact Sheet for Healthcare Providers:  BloggerCourse.com  This test is no t yet approved or cleared by the [employer] FDA and  has been authorized for detection and/or diagnosis of SARS-CoV-2 by FDA under an Emergency Use Authorization (EUA). This EUA will remain  in effect (meaning this test can be used) for the duration of the COVID-19 declaration under Section 564(b)(1) of the Act, 21 U.S.C.section 360bbb-3(b)(1), unless the authorization is terminated  or revoked sooner.       Influenza A by PCR NEGATIVE NEGATIVE Final   Influenza B by PCR NEGATIVE NEGATIVE Final    Comment: (NOTE) The Xpert Xpress SARS-CoV-2/FLU/RSV plus assay is intended as an aid in the diagnosis of influenza from Nasopharyngeal swab specimens and should not be used as a sole basis for treatment. Nasal washings and aspirates are unacceptable for Xpert Xpress SARS-CoV-2/FLU/RSV testing.  Fact Sheet for Patients: [place]  Fact Sheet for Healthcare Providers: BloggerCourse.com  This test is not yet approved or cleared by the [employer] FDA and has been authorized for detection and/or  diagnosis of SARS-CoV-2 by FDA under an Emergency Use Authorization (EUA). This EUA will remain in effect (meaning this test can be used) for the duration of the COVID-19 declaration under Section 564(b)(1) of the Act, 21 U.S.C. section 360bbb-3(b)(1), unless the authorization is terminated or revoked.  Performed at [facility], [address], [place], [place]      Radiological Exams on Admission: DG Chest 2 View  Result Date: 01/24/2021 CLINICAL DATA:  Shortness of breath. Recently diagnosed with bilateral pneumonia. Possible sepsis. EXAM: CHEST - 2 VIEW COMPARISON:  01/22/2021 FINDINGS: Normal sized heart. Clear lungs. Unremarkable bones. IMPRESSION: Normal examination. Electronically Signed   By: 01/24/2021 M.D.   On: 01/24/2021 09:01     EKG: I have personally reviewed.  Sinus rhythm, QTC 442, LAE, low voltage, nonspecific T wave change  Assessment/Plan Principal Problem:   Acute respiratory failure  with hypoxia (HCC) Active Problems:   Acute deep vein thrombosis (DVT) of right femoral vein (HCC)   Obesity, Class III, BMI 40-49.9 (morbid obesity) (HCC)   Pulmonary embolism (HCC)   Tobacco abuse   Abdominal pain   Nausea vomiting and diarrhea   Chest pain   COPD exacerbation (HCC)   Acute respiratory failure with hypoxia (HCC) due to possible undiagnosed COPD with acute exacerbation: Patient is smoker, he has wheezing on auscultation, negative chest x-ray clinically consistent with possible COPD with exacerbation.  Due to morbid obesity, patient may have OSA. Patient has leukocytosis with WBC 12.1 which is likely due to recent steroid use.  No fever.  Clinically does not seem to have sepsis.  - will admit to med-surg unit as inpatient -Bronchodilators -Solu-Medrol 60 mg IV bid -Z pak  -Mucinex for cough  -Incentive spirometry -Follow up blood culture x2, sputum culture -Nasal cannula oxygen as needed to maintain O2 saturation 93% or  greater  Acute deep vein thrombosis (DVT) of right femoral vein (HCC) and PE:  -resume xarelto  Chest pain: Troponin level 8, 7, 5.  Patient certainly has risk of PE, but recent CT angiogram on 01/21/2021 is negative.  Will not repeat CT angiogram since this will not change management.  Patient is already restarted on Xarelto. -Trend troponin -Will not start aspirin due to Xarelto use -Check A1c, FLP  Obesity, Class III, BMI 40-49.9 (morbid obesity) (HCC) -Diet and exercise.   -Encouraged to lose weight.   Tobacco abuse -nicotine patch  Abdominal pain, nausea vomiting and diarrhea: lipase normal, 24.  -f/u CT-abd/pelvis -prn zofran and morphine for pain - f/u C diff pcr and GI path panel   DVT ppx: on Xarelto Code Status: Full code Family Communication: not done, no family member is at bed side.     Disposition Plan:  Anticipate discharge back to previous environment Consults called:  none Admission status and Level of care: Med-Surg:   as inpt       Status is: Inpatient  Remains inpatient appropriate because:Inpatient level of care appropriate due to severity of illness   Dispo: The patient is from: Home              Anticipated d/c is to: Home              Patient currently is not medically stable to d/c.   Difficult to place patient No          Date of Service 01/24/2021    Lorretta HarpXilin [facility]   If 7PM-7AM, please contact night-coverage [email or web address] 01/24/2021, 5:09 PM

## 2021-01-24 NOTE — ED Triage Notes (Signed)
Pt comes from UC with c/o SOB. Pt was dx Monday with double pneumonia. Pt states increased SOB with exertion.  UC reports pt's O2 was low.  Pt denies any CP. Pt reports cough, chills and fever.

## 2021-01-24 NOTE — ED Notes (Signed)
Pt O2 sats dropped to 86% while sleeping.  Pt placed on 2L [place].

## 2021-01-24 NOTE — ED Provider Notes (Signed)
[facility] Emergency Department Provider Note    Event Date/Time   First MD Initiated Contact with Patient 01/24/21 [phone]     (approximate)  I have reviewed the triage vital signs and the nursing notes.   HISTORY  Chief Complaint Shortness of Breath    HPI Dalton Pena is a 40 y.o. male below listed past medical history presents to the ER for worsening shortness of breath cough congestion fevers.  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  Be hypoxic when sleeping and thought to have sleep apnea.  Does have family history of CHF.  He was discharged on doxycycline plan for outpatient follow-up but has had continued worsening symptoms.  Denies any chest pain or pressure right now.  Denies any worsening lower extremity swelling.  He does smoke.  Went to urgent care and reportedly was hypoxic after ambulating into the waiting room.  Was very short of breath at that time states that his oxygen level was "56% " and was sent to the ER.    Past Medical History:  Diagnosis Date  . Deep vein thrombosis (DVT) (HCC)   . Pulmonary embolism (HCC)    No family history on file. History reviewed. No pertinent surgical history. Patient Active Problem List   Diagnosis Date Noted  . Acute respiratory failure with hypoxia (HCC) 01/24/2021  . Tobacco abuse 01/24/2021  . Abdominal pain 01/24/2021  . Nausea vomiting and diarrhea 01/24/2021  . Acute deep vein thrombosis (DVT) of right femoral vein (HCC) 03/15/2020  . 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 Obesity, Class III, BMI 40-49.9 (morbid obesity) (HCC) 03/15/2020  . Acute gastroenteritis 03/15/2020  . Acute pulmonary embolism (HCC) 03/15/2020  . Pulmonary embolism (HCC) 03/15/2020  . Acute deep vein thrombosis (DVT) of calf muscle vein of right  lower extremity (HCC)       Prior to Admission medications   Medication Sig Start Date End Date Taking? Authorizing Provider  chlorpheniramine-HYDROcodone (TUSSIONEX PENNKINETIC ER) 10-8 MG/5ML SUER Take 5 mLs by mouth every 12 (twelve) hours as needed for cough. 01/22/21   Ward, Layla Maw, DO  rivaroxaban (XARELTO) 20 MG TABS tablet Take 1 tablet (20 mg total) by mouth daily with supper. 01/22/21   Ward, Layla Maw, DO    Allergies Patient has no known allergies.    Social History Social History   Tobacco Use  . Smoking status: Current Every Day Smoker  . Smokeless tobacco: Never Used  Substance Use Topics  . Alcohol use: Yes  . Drug use: Not Currently    Review of Systems Patient denies headaches, rhinorrhea, blurry vision, numbness, shortness of breath, chest pain, edema, cough, abdominal pain, nausea, vomiting, diarrhea, dysuria, fevers, rashes or hallucinations unless otherwise stated above in HPI. ____________________________________________   PHYSICAL EXAM:  VITAL SIGNS: Vitals:   01/24/21 1300 01/24/21 1315  BP: 139/75   Pulse: (!) 50 (!) 59  Resp: 20 (!) 22  Temp:    SpO2: (!) 89% 100%    Constitutional: Alert and oriented.  Eyes: Conjunctivae are normal.  Head: Atraumatic. Nose: No congestion/rhinnorhea. Mouth/Throat: Mucous membranes are moist.   Neck: No stridor. Painless ROM.  Cardiovascular: Normal rate, regular rhythm. Grossly normal heart sounds.  Good peripheral circulation. Respiratory: Normal respiratory effort.  No retractions. Lungs with prolonged exp phase and coarse  end exp wheeze Gastrointestinal: Soft and nontender. No distention. No abdominal bruits. No CVA tenderness. Genitourinary: deferred Musculoskeletal: No lower extremity tenderness nor edema.  No joint effusions. Neurologic:  Normal speech and language. No gross focal neurologic deficits are appreciated. No facial droop Skin:  Skin is warm, dry and intact. No rash  noted. Psychiatric: Mood and affect are normal. Speech and behavior are normal.  ____________________________________________   LABS (all labs ordered are listed, but only abnormal results are displayed)  Results for orders placed or performed during the hospital encounter of 01/24/21 (from the past 24 hour(s))  Brain natriuretic peptide     Status: None   Collection Time: 01/24/21  8:40 AM  Result Value Ref Range   B Natriuretic Peptide 10.3 0.0 - 100.0 pg/mL  Resp Panel by RT-PCR (Flu A&B, Covid) Nasopharyngeal Swab     Status: None   Collection Time: 01/24/21  8:40 AM   Specimen: Nasopharyngeal Swab; Nasopharyngeal(NP) swabs in vial transport medium  Result Value Ref Range   SARS Coronavirus 2 by RT PCR NEGATIVE NEGATIVE   Influenza A by PCR NEGATIVE NEGATIVE   Influenza B by PCR NEGATIVE NEGATIVE  Lactic acid, plasma     Status: None   Collection Time: 01/24/21  8:41 AM  Result Value Ref Range   Lactic Acid, Venous 0.8 0.5 - 1.9 mmol/L  Comprehensive metabolic panel     Status: Abnormal   Collection Time: 01/24/21  8:41 AM  Result Value Ref Range   Sodium 136 135 - 145 mmol/L   Potassium 3.6 3.5 - 5.1 mmol/L   Chloride 103 98 - 111 mmol/L   CO2 24 22 - 32 mmol/L   Glucose, Bld 137 (H) 70 - 99 mg/dL   BUN 15 6 - 20 mg/dL   Creatinine, Ser 7.68 (H) 0.61 - 1.24 mg/dL   Calcium 8.4 (L) 8.9 - 10.3 mg/dL   Total Protein 7.5 6.5 - 8.1 g/dL   Albumin 3.6 3.5 - 5.0 g/dL   AST 26 15 - 41 U/L   ALT 26 0 - 44 U/L   Alkaline Phosphatase 46 38 - 126 U/L   Total Bilirubin 0.8 0.3 - 1.2 mg/dL   GFR, Estimated >11 >57 mL/min   Anion gap 9 5 - 15  CBC WITH DIFFERENTIAL     Status: Abnormal   Collection Time: 01/24/21  8:41 AM  Result Value Ref Range   WBC 12.1 (H) 4.0 - 10.5 K/uL   RBC 4.83 4.22 - 5.81 MIL/uL   Hemoglobin 15.7 13.0 - 17.0 g/dL   HCT 26.2 03.5 - 59.7 %   MCV 95.9 80.0 - 100.0 fL   MCH 32.5 26.0 - 34.0 pg   MCHC 33.9 30.0 - 36.0 g/dL   RDW 41.6 38.4 - 53.6 %    Platelets 260 150 - 400 K/uL   nRBC 0.0 0.0 - 0.2 %   Neutrophils Relative % 65 %   Neutro Abs 7.8 (H) 1.7 - 7.7 K/uL   Lymphocytes Relative 26 %   Lymphs Abs 3.1 0.7 - 4.0 K/uL   Monocytes Relative 7 %   Monocytes Absolute 0.8 0.1 - 1.0 K/uL   Eosinophils Relative 2 %   Eosinophils Absolute 0.2 0.0 - 0.5 K/uL   Basophils Relative 0 %   Basophils Absolute 0.0 0.0 - 0.1 K/uL   Immature Granulocytes 0 %   Abs Immature Granulocytes 0.04 0.00 - 0.07 K/uL  Protime-INR     Status: None   Collection  Time: 01/24/21  8:41 AM  Result Value Ref Range   Prothrombin Time 13.2 11.4 - 15.2 seconds   INR 1.0 0.8 - 1.2  APTT     Status: None   Collection Time: 01/24/21  8:41 AM  Result Value Ref Range   aPTT 27 24 - 36 seconds  Troponin I (High Sensitivity)     Status: None   Collection Time: 01/24/21  8:41 AM  Result Value Ref Range   Troponin I (High Sensitivity) 8 <18 ng/L  Troponin I (High Sensitivity)     Status: None   Collection Time: 01/24/21 10:32 AM  Result Value Ref Range   Troponin I (High Sensitivity) 7 <18 ng/L   ____________________________________________  EKG My review and personal interpretation at Time: 8:12   Indication: cough  Rate: 85  Rhythm: sinus Axis: normal Other: poor r wave progression, no stemi ____________________________________________  RADIOLOGY  I personally reviewed all radiographic images ordered to evaluate for the above acute complaints and reviewed radiology reports and findings.  These findings were personally discussed with the patient.  Please see medical record for radiology report.  ____________________________________________   PROCEDURES  Procedure(s) performed:  .Critical Care Performed by: Willy Eddyobinson, Sarahbeth Cashin, MD Authorized by: Willy Eddyobinson, Matai Carpenito, MD   Critical care provider statement:    Critical care time (minutes):  35   Critical care time was exclusive of:  Separately billable procedures and treating other patients   Critical  care was necessary to treat or prevent imminent or life-threatening deterioration of the following conditions:  Respiratory failure   Critical care was time spent personally by me on the following activities:  Development of treatment plan with patient or surrogate, discussions with consultants, evaluation of patient's response to treatment, examination of patient, obtaining history from patient or surrogate, ordering and performing treatments and interventions, ordering and review of laboratory studies, ordering and review of radiographic studies, pulse oximetry, re-evaluation of patient's condition and review of old charts      Critical Care performed: yes  ____________________________________________   INITIAL IMPRESSION / ASSESSMENT AND PLAN / ED COURSE  Pertinent labs & imaging results that were available during my care of the patient were reviewed by me and considered in my medical decision making (see chart for details).   DDX: Asthma, copd, CHF, pna, ptx, malignancy, Pe, anemia   Dalton Pena is a 40 y.o. who presents to the ED with presentation as described above.  Patient appears hemodynamically stable nontoxic and does have wheezing on exam certainly appears to be failing outpatient treatment of possible community-acquired pneumonia.  Just recently had CTA which was negative for PE.  Blood work sent for the above differential.  Clinical Course as of 01/24/21 1323  Thu Jan 24, 2021  [id number] Patient is feeling better after neb.  Given reassuring work-up thus far suspect this more likely secondary to underlying bronchitis.  Will give additional steroid.  States that he stopped his prednisone taper at 4 from a 60 taper.  Will give additional dose of Solu-Medrol additional neb and reassess.  Will ambulate to see if he is significantly symptomatic or hypoxic. [PR]  1143 Was ambulated and desaturated down to the mid 80s.  Placed on 2 L nasal cannula.  Based on his presentation will  discuss with hospitalist for admission.  Have discussed with the patient and available family all diagnostics and treatments performed thus far and all questions were answered to the best of my ability. The patient demonstrates understanding and agreement  with plan.  [PR]    Clinical Course User Index [PR] Willy Eddy, MD    The patient was evaluated in Emergency Department today for the symptoms described in the history of present illness. He/she was evaluated in the context of the global COVID-19 pandemic, which necessitated consideration that the patient might be at risk for infection with the SARS-CoV-2 virus that causes COVID-19. Institutional protocols and algorithms that pertain to the evaluation of patients at risk for COVID-19 are in a state of rapid change based on information released by regulatory bodies including the CDC and federal and state organizations. These policies and algorithms were followed during the patient's care in the ED.  As part of my medical decision making, I reviewed the following data within the electronic MEDICAL RECORD NUMBER Nursing notes reviewed and incorporated, Labs reviewed, notes from prior ED visits and [place] Controlled Substance Database   ____________________________________________   FINAL CLINICAL IMPRESSION(S) / ED DIAGNOSES  Final diagnoses:  Acute respiratory failure with hypoxia (HCC)      NEW MEDICATIONS STARTED DURING THIS VISIT:  New Prescriptions   No medications on file     Note:  This document was prepared using Dragon voice recognition software and may include unintentional dictation errors.    Willy Eddy, MD 01/24/21 1323

## 2021-01-24 NOTE — ED Notes (Signed)
Ambulated pt in hallway, approx 50 feet.  Pt became dyspneic, O2 dropped to 86%.

## 2021-01-24 NOTE — ED Notes (Signed)
Informed RN bed assigned 1207

## 2021-01-25 ENCOUNTER — Inpatient Hospital Stay: Payer: 59

## 2021-01-25 ENCOUNTER — Encounter: Payer: Self-pay | Admitting: Internal Medicine

## 2021-01-25 DIAGNOSIS — R0789 Other chest pain: Secondary | ICD-10-CM

## 2021-01-25 DIAGNOSIS — J441 Chronic obstructive pulmonary disease with (acute) exacerbation: Secondary | ICD-10-CM

## 2021-01-25 LAB — LIPID PANEL
Cholesterol: 171 mg/dL (ref 0–200)
HDL: 27 mg/dL — ABNORMAL LOW (ref >40)
LDL Cholesterol: 125 mg/dL — ABNORMAL HIGH (ref 0–99)
Total CHOL/HDL Ratio: 6.3 RATIO
Triglycerides: 93 mg/dL (ref <150)
VLDL: 19 mg/dL (ref 0–40)

## 2021-01-25 LAB — BASIC METABOLIC PANEL
Anion gap: 8 (ref 5–15)
BUN: 15 mg/dL (ref 6–20)
CO2: 25 mmol/L (ref 22–32)
Calcium: 9 mg/dL (ref 8.9–10.3)
Chloride: 104 mmol/L (ref 98–111)
Creatinine, Ser: 0.98 mg/dL (ref 0.61–1.24)
GFR, Estimated: >60 mL/min (ref >60)
Glucose, Bld: 196 mg/dL — ABNORMAL HIGH (ref 70–99)
Potassium: 4.3 mmol/L (ref 3.5–5.1)
Sodium: 137 mmol/L (ref 135–145)

## 2021-01-25 LAB — EXPECTORATED SPUTUM ASSESSMENT W GRAM STAIN, RFLX TO RESP C

## 2021-01-25 LAB — HEMOGLOBIN A1C
Hgb A1c MFr Bld: 6.2 % — ABNORMAL HIGH (ref 4.8–5.6)
Mean Plasma Glucose: 131.24 mg/dL

## 2021-01-25 LAB — CBC
HCT: 46.1 % (ref 39.0–52.0)
Hemoglobin: 16 g/dL (ref 13.0–17.0)
MCH: 33.2 pg (ref 26.0–34.0)
MCHC: 34.7 g/dL (ref 30.0–36.0)
MCV: 95.6 fL (ref 80.0–100.0)
Platelets: 285 10*3/uL (ref 150–400)
RBC: 4.82 MIL/uL (ref 4.22–5.81)
RDW: 13.2 % (ref 11.5–15.5)
WBC: 15 10*3/uL — ABNORMAL HIGH (ref 4.0–10.5)
nRBC: 0 % (ref 0.0–0.2)

## 2021-01-25 LAB — C-REACTIVE PROTEIN: CRP: 3.4 mg/dL — ABNORMAL HIGH (ref <1.0)

## 2021-01-25 LAB — PROCALCITONIN: Procalcitonin: <0.1 ng/mL

## 2021-01-25 LAB — GLUCOSE, CAPILLARY
Glucose-Capillary: 182 mg/dL — ABNORMAL HIGH (ref 70–99)
Glucose-Capillary: 207 mg/dL — ABNORMAL HIGH (ref 70–99)
Glucose-Capillary: 157 mg/dL — ABNORMAL HIGH (ref 70–99)
Glucose-Capillary: 192 mg/dL — ABNORMAL HIGH (ref 70–99)

## 2021-01-25 LAB — SEDIMENTATION RATE: Sed Rate: 13 mm/hr (ref 0–15)

## 2021-01-25 MED ORDER — IOHEXOL 300 MG/ML  SOLN
100.0000 mL | Freq: Once | INTRAMUSCULAR | Status: AC | PRN
  Administered 2021-01-25: 100 mL via INTRAVENOUS

## 2021-01-25 MED ORDER — SODIUM CHLORIDE 0.9 % IV SOLN
1.0000 g | INTRAVENOUS | Status: DC
  Administered 2021-01-25: 1 g via INTRAVENOUS
  Filled 2021-01-25: qty 1
  Filled 2021-01-25: qty 10

## 2021-01-25 MED ORDER — SODIUM CHLORIDE 0.9 % IV SOLN
500.0000 mg | INTRAVENOUS | Status: DC
  Administered 2021-01-25: 500 mg via INTRAVENOUS
  Filled 2021-01-25 (×2): qty 500

## 2021-01-25 MED ORDER — IOHEXOL 9 MG/ML PO SOLN
500.0000 mL | ORAL | Status: AC
  Administered 2021-01-25 (×2): 500 mL via ORAL

## 2021-01-25 MED ORDER — IPRATROPIUM-ALBUTEROL 0.5-2.5 (3) MG/3ML IN SOLN
3.0000 mL | Freq: Four times a day (QID) | RESPIRATORY_TRACT | Status: DC
  Administered 2021-01-25 – 2021-01-26 (×3): 3 mL via RESPIRATORY_TRACT
  Filled 2021-01-25 (×3): qty 3

## 2021-01-25 MED ORDER — NICOTINE 21 MG/24HR TD PT24
21.0000 mg | MEDICATED_PATCH | Freq: Once | TRANSDERMAL | Status: AC
  Administered 2021-01-25: 21:00:00 21 mg via TRANSDERMAL
  Filled 2021-01-25: qty 1

## 2021-01-25 NOTE — Progress Notes (Signed)
PROGRESS NOTE                                                                             PROGRESS NOTE                                                                                                                                                                                                             Patient Demographics:    Dalton Pena, is a 40 y.o. male, DOB - [birth year], HUD:[id number]  Outpatient Primary MD for the patient is Pcp, No    LOS - 1  Admit date - 01/24/2021    Chief Complaint  Patient presents with  . Shortness of Breath       Brief Narrative    Dalton Pena is a 40 y.o. 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  Patient states that he has been having shortness of breath for for more than 1 week, particularly on exertion.  He has cough with some mucus production.  He also reports chest pain, which is located in the whole front chest, constant, 8 out of 10 in severity, sharp, nonradiating.  Patient cannot give detailed information to further characterize the nature of his chest pain.  Patient was seen in [facility] clinic on 01/21/2021.  He had CT angiogram which was negative for PE, but showed "Scattered areas of tree-in-bud opacities within the right upper and lower lobes as well as scattered diffuse groundglass nodular opacities".  Patient was started on doxycycline, no significant improvement. He was seen in ED again in 4/19 and was instructed to continue doxycycline.  Pt was reportedly to have hypoxia when sleeping and thought to have sleep apnea.   Patient also reports nausea, vomiting, diarrhea and abdominal pain.  Patient stated he had 1 or 2 loose stool bowel movement today.  He vomited several times which was nonbilious and nonbloody.  He also reports abdominal pain which is  diffuse,  sharp, 7 out of 10 in severity, nonradiating.  Denies symptoms of UTI.  No hematuria.  Patient is not taking Xarelto currently.  Patient had oxygen desaturation to 86% on room air initially, currently 90-100% on 2 L oxygen in ED    Subjective:    Dalton Pena today ports his dyspnea has improved, still reports cough, reports generalized weakness, still complains of abdominal pain, but no diarrhea, no vomiting.    Assessment  & Plan :    Principal Problem:   Acute respiratory failure with hypoxia (HCC) Active Problems:   Acute deep vein thrombosis (DVT) of right femoral vein (HCC)   Obesity, Class III, BMI 40-49.9 (morbid obesity) (HCC)   Pulmonary embolism (HCC)   Tobacco abuse   Abdominal pain   Nausea vomiting and diarrhea   Chest pain   COPD exacerbation (HCC)    Acute respiratory failure with hypoxia (HCC) due to possible undiagnosed COPD with acute exacerbation:  -Patient 86% on room air, requiring 2 L nasal cannula initially, he is back to room air today . -Recent imaging significant for a typical opacity with some groundglass endings, be related to atypical pneumonia . -Continue with IV Solu-Medrol, will add IV Rocephin and azithromycin to cover for factious component . -Requested pulmonary input regarding further recommendation visually with abnormal CT findings . -Well patient very likely with underlining obstructive sleep apnea, reports he had a sleep study scheduled as an outpatient next month.  Of recurrent DVT/PE . -He has not been taking his Xarelto, he was resumed on it .  Chest pain:  -Has nontypical, most likely related to cough, troponins none ACS pattern > 8 > 7 > 5 . -Recent CTA chest negative for PE   Obesity, Class III, BMI 40-49.9 (morbid obesity) (HCC) -Diet and exercise.  -Encouraged to lose weight.  Tobacco abuse -nicotine patch  Abdominal pain, nausea vomiting and diarrhea:  -lipase normal, 24.  -CT abdomen pelvis with no acute  finding. -Reports his symptoms have significantly improved.   SpO2: 97 % O2 Flow Rate (L/min): 3 L/min  Recent Labs  Lab 01/22/21 0212 01/24/21 0840 01/24/21 0841 01/25/21 0503  WBC 13.2*  --  12.1* 15.0*  PLT 278  --  260 285  BNP  --  10.3  --   --   PROCALCITON  --   --   --  <0.10  AST  --   --  26  --   ALT  --   --  26  --   ALKPHOS  --   --  46  --   BILITOT  --   --  0.8  --   ALBUMIN  --   --  3.6  --   INR  --   --  1.0  --   LATICACIDVEN  --   --  0.8  --   SARSCOV2NAA  --  NEGATIVE  --   --        ABG  No results found for: PHART, PCO2ART, PO2ART, HCO3, TCO2, ACIDBASEDEF, O2SAT         Condition - Extremely Guarded  Family Communication  :  Wife at bedside  Code Status :  full  Consults  :  Pulmonary  Procedures  :  none   Status is: Inpatient  Remains inpatient appropriate because:IV treatments appropriate due to intensity of illness or inability to take PO   Dispo: The patient is from: Home  Anticipated d/c is to: Home              Patient currently is not medically stable to d/c.   Difficult to place patient No      DVT Prophylaxis  :  xarelto  Lab Results  Component Value Date   PLT 285 01/25/2021    Diet :  Diet Order            Diet regular Room service appropriate? Yes; Fluid consistency: Thin  Diet effective now                  Inpatient Medications  Scheduled Meds: . ipratropium-albuterol  3 mL Nebulization Q6H  . methylPREDNISolone (SOLU-MEDROL) injection  60 mg Intravenous Q12H  . nicotine  21 mg Transdermal Daily  . rivaroxaban  20 mg Oral Q supper   Continuous Infusions: . azithromycin    . cefTRIAXone (ROCEPHIN)  IV 1 g (01/25/21 1325)   PRN Meds:.acetaminophen, albuterol, dextromethorphan-guaiFENesin, morphine injection, ondansetron (ZOFRAN) IV  Antibiotics  :    Anti-infectives (From admission, onward)   Start     Dose/Rate Route Frequency Ordered Stop   01/25/21 1300   cefTRIAXone (ROCEPHIN) 1 g in sodium chloride 0.9 % 100 mL IVPB        1 g 200 mL/hr over 30 Minutes Intravenous Every 24 hours 01/25/21 1211     01/25/21 1300  azithromycin (ZITHROMAX) 500 mg in sodium chloride 0.9 % 250 mL IVPB        500 mg 250 mL/hr over 60 Minutes Intravenous Every 24 hours 01/25/21 1211          Mliss Fritz Sophee Mckimmy M.D on 01/25/2021 at 1:54 PM  To page go to [email or web address]   [facility] -  Office  [phone]        Objective:   Vitals:   01/25/21 0415 01/25/21 0459 01/25/21 0741 01/25/21 0900  BP:  (!) 116/53  126/77  Pulse: 63 (!) 49 (!) 53 62  Resp:  Temp:  97.6 F (36.4 C)  98 F (36.7 C)  TempSrc:  Oral    SpO2: 100% 99% 97% 97%  Weight:      Height:        Wt Readings from Last 3 Encounters:  01/24/21 128.4 kg  01/22/21 127 kg  01/09/21 131.5 kg    No intake or output data in the 24 hours ending 01/25/21 1354   Physical Exam  Awake Alert, No new F.N deficits, Normal affect Symmetrical Chest wall movement, Good air movement bilaterally, no wheezing today RRR,No Gallops,Rubs or new Murmurs, No Parasternal Heave +ve B.Sounds, Abd Soft, No tenderness, No rebound - guarding or rigidity. No Cyanosis, Clubbing or edema, No new Rash or bruise      Data Review:    CBC Recent Labs  Lab 01/22/21 0212 01/24/21 0841 01/25/21 0503  WBC 13.2* 12.1* 15.0*  HGB 15.3 15.7 16.0  HCT 45.6 46.3 46.1  PLT 278 260 285  MCV 97.4 95.9 95.6  MCH 32.7 32.5 33.2  MCHC 33.6 33.9 34.7  RDW 14.1 13.3 13.2  LYMPHSABS 1.5 3.1  --   MONOABS 0.8 0.8  --   EOSABS 0.1 0.2  --   BASOSABS 0.1 0.0  --     Recent Labs  Lab 01/22/21 0212 01/24/21 0840 01/24/21 0841 01/25/21 0503  NA 135  --  136 137  K 3.9  --  3.6 4.3  CL 100  --  103  104  CO2 26  --  24 25  GLUCOSE 109*  --  137* 196*  BUN 14  --  15 15  CREATININE 1.08  --  1.29* 0.98  CALCIUM 8.6*  --  8.4* 9.0  AST  --   --  26  --   ALT  --   --  26  --   ALKPHOS  --    --  46  --   BILITOT  --   --  0.8  --   ALBUMIN  --   --  3.6  --   PROCALCITON  --   --   --  <0.10  LATICACIDVEN  --   --  0.8  --   INR  --   --  1.0  --   HGBA1C  --   --   --  6.2*  BNP  --  10.3  --   --     ------------------------------------------------------------------------------------------------------------------ Recent Labs    01/25/21 0503  CHOL 171  HDL 27*  LDLCALC 125*  TRIG 93  CHOLHDL 6.3    Lab Results  Component Value Date   HGBA1C 6.2 (H) 01/25/2021   ------------------------------------------------------------------------------------------------------------------ No results for input(s): TSH, T4TOTAL, T3FREE, THYROIDAB in the last 72 hours.  Invalid input(s): FREET3  Cardiac Enzymes No results for input(s): CKMB, TROPONINI, MYOGLOBIN in the last 168 hours.  Invalid input(s): CK ------------------------------------------------------------------------------------------------------------------    Component Value Date/Time   BNP 10.3 01/24/2021 0840    Micro Results Recent Results (from the past 240 hour(s))  Resp Panel by RT-PCR (Flu A&B, Covid) Nasopharyngeal Swab     Status: None   Collection Time: 01/24/21  8:40 AM   Specimen: Nasopharyngeal Swab; Nasopharyngeal(NP) swabs in vial transport medium  Result Value Ref Range Status   SARS Coronavirus 2 by RT PCR NEGATIVE NEGATIVE Final    Comment: (NOTE) SARS-CoV-2 target nucleic acids are NOT DETECTED.  The SARS-CoV-2 RNA is generally detectable in upper respiratory specimens during the acute phase of infection. The lowest concentration of SARS-CoV-2 viral copies this assay can detect is 138 copies/mL. A negative result does not preclude SARS-Cov-2 infection and should not be used as the sole basis for treatment or other patient management decisions. A negative result may occur with  improper specimen collection/handling, submission of specimen other than nasopharyngeal swab, presence  of viral mutation(s) within the areas targeted by this assay, and inadequate number of viral copies(<138 copies/mL). A negative result must be combined with clinical observations, patient history, and epidemiological information. The expected result is Negative.  Fact Sheet for Patients:  BloggerCourse.com  Fact Sheet for Healthcare Providers:  SeriousBroker.it  This test is no t yet approved or cleared by the [place] FDA and  has been authorized for detection and/or diagnosis of SARS-CoV-2 by FDA under an Emergency Use Authorization (EUA). This EUA will remain  in effect (meaning this test can be used) for the duration of the COVID-19 declaration under Section 564(b)(1) of the Act, 21 U.S.C.section 360bbb-3(b)(1), unless the authorization is terminated  or revoked sooner.       Influenza A by PCR NEGATIVE NEGATIVE Final   Influenza B by PCR NEGATIVE NEGATIVE Final    Comment: (NOTE) The Xpert Xpress SARS-CoV-2/FLU/RSV plus assay is intended as an aid in the diagnosis of influenza from Nasopharyngeal swab specimens and should not be used as a sole basis for treatment. Nasal washings and aspirates are unacceptable for Xpert Xpress SARS-CoV-2/FLU/RSV testing.  Fact Sheet  for Patients: BloggerCourse.comhttps://www.fda.gov/media/152166/download  Fact Sheet for Healthcare Providers: SeriousBroker.ithttps://www.fda.gov/media/152162/download  This test is not yet approved or cleared by the [place] FDA and has been authorized for detection and/or diagnosis of SARS-CoV-2 by FDA under an Emergency Use Authorization (EUA). This EUA will remain in effect (meaning this test can be used) for the duration of the COVID-19 declaration under Section 564(b)(1) of the Act, 21 U.S.C. section 360bbb-3(b)(1), unless the authorization is terminated or revoked.  Performed at [facility], [address] [place], [place] [address]   Blood Culture (routine  x 2)     Status: None (Preliminary result)   Collection Time: 01/24/21  8:41 AM   Specimen: BLOOD  Result Value Ref Range Status   Specimen Description BLOOD RIGHT ANTECUBITAL  Final   Special Requests   Final    BOTTLES DRAWN AEROBIC AND ANAEROBIC Blood Culture adequate volume   Culture   Final    NO GROWTH < 24 HOURS Performed at [facility], [address] [place], [place] [address]    Report Status PENDING  Incomplete  Blood Culture (routine x 2)     Status: None (Preliminary result)   Collection Time: 01/24/21  8:41 AM   Specimen: BLOOD  Result Value Ref Range Status   Specimen Description BLOOD RIGHT ANTECUBITAL  Final   Special Requests   Final    BOTTLES DRAWN AEROBIC AND ANAEROBIC Blood Culture results may not be optimal due to an inadequate volume of blood received in culture bottles   Culture   Final    NO GROWTH < 24 HOURS Performed at [facility], [address] [place], [place] [address]    Report Status PENDING  Incomplete    Radiology Reports DG Chest 2 View  Result Date: 01/24/2021 CLINICAL DATA:  Shortness of breath. Recently diagnosed with bilateral pneumonia. Possible sepsis. EXAM: CHEST - 2 VIEW COMPARISON:  01/22/2021 FINDINGS: Normal sized heart. Clear lungs. Unremarkable bones. IMPRESSION: Normal examination. Electronically Signed   By: Beckie SaltsSteven  Reid M.D.   On: 01/24/2021 09:01   DG Chest 2 View  Result Date: 01/22/2021 CLINICAL DATA:  Shortness of breath, cough EXAM: CHEST - 2 VIEW COMPARISON:  07/13/2020 FINDINGS: Peribronchial thickening. Heart and mediastinal contours are within normal limits. No focal opacities or effusions. No acute bony abnormality. IMPRESSION: Bronchitic changes. Electronically Signed   By: Charlett NoseKevin  Dover M.D.   On: 01/22/2021 01:55   CT ABDOMEN PELVIS W CONTRAST  Result Date: 01/25/2021 CLINICAL DATA:  Acute generalized abdominal pain. EXAM: CT ABDOMEN AND PELVIS WITH CONTRAST TECHNIQUE: Multidetector CT  imaging of the abdomen and pelvis was performed using the standard protocol following bolus administration of intravenous contrast. CONTRAST:  100mL OMNIPAQUE IOHEXOL 300 MG/ML  SOLN COMPARISON:  March 15, 2020. FINDINGS: Lower chest: No acute abnormality. Hepatobiliary: No focal liver abnormality is seen. No gallstones, gallbladder wall thickening, or biliary dilatation. Pancreas: Unremarkable. No pancreatic ductal dilatation or surrounding inflammatory changes. Spleen: Normal in size without focal abnormality. Adrenals/Urinary Tract: Adrenal glands are unremarkable. Kidneys are normal, without renal calculi, focal lesion, or hydronephrosis. Bladder is unremarkable. Stomach/Bowel: Stomach is within normal limits. Appendix appears normal. No evidence of bowel wall thickening, distention, or inflammatory changes. Vascular/Lymphatic: No significant vascular findings are present. No enlarged abdominal or pelvic lymph nodes. Reproductive: Prostate is unremarkable. Other: No abdominal wall hernia or abnormality. No abdominopelvic ascites. Musculoskeletal: No acute or significant osseous findings. IMPRESSION: No abnormality seen in the abdomen or pelvis. Electronically Signed  By: Lupita Raider M.D.   On: 01/25/2021 12:24   DG Foot Complete Right  Result Date: 01/09/2021 CLINICAL DATA:  Acute onset sharp pain to the right heel for 2 weeks. No injury. EXAM: RIGHT FOOT COMPLETE - 3+ VIEW COMPARISON:  None. FINDINGS: Mild degenerative changes in the first metatarsal-phalangeal joint. Prominent Achilles calcaneal spur. No evidence of acute fracture or dislocation. No focal bone lesion or bone destruction. Bone cortex appears intact. Soft tissues are unremarkable. IMPRESSION: No acute bony abnormalities.  Prominent Achilles calcaneal spur. Electronically Signed   By: Burman Nieves M.D.   On: 01/09/2021 02:44

## 2021-01-25 NOTE — Consult Note (Signed)
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  Of note patient has significant cardiomegaly with with flattening of the septum and enlargement of the right ventricle possibly due to CHF/pulmonary hypertension/chronic thromboembolic disease.  Patient has no significant autoimmune history or chronic lung disease that he is aware of.  He has significantly improved after initiation of nebulizer therapy as well as Zithromax and prednisone with typical COPD/asthma regimen.  CBC shows leukocytosis after initiation of steroids likely due to demargination.  His basic metabolic panel shows mild AKI which has resolved and hyperglycemia which is likely due to his anti-inflammatory regimen.   PAST MEDICAL HISTORY   Past Medical History:  Diagnosis Date  . Deep vein thrombosis (DVT) ([place])   . Pulmonary embolism ([place])      SURGICAL HISTORY   History reviewed. No pertinent surgical history.   FAMILY HISTORY   Family History  Problem Relation Age of Onset  . Heart failure Father      SOCIAL HISTORY   Social History   Tobacco Use  . Smoking status: Current Every Day Smoker  . Smokeless tobacco: Never Used  Substance Use Topics  . Alcohol use: Yes  . Drug use: Not Currently     MEDICATIONS    Home Medication:     Current Medication:  Current Facility-Administered Medications:  .  acetaminophen (TYLENOL) tablet 650 mg, 650 mg, Oral, Q6H PRN, Ivor Costa, MD .  albuterol (PROVENTIL) (2.5 MG/3ML) 0.083% nebulizer solution 2.5 mg, 2.5 mg, Nebulization, Q4H PRN, Ivor Costa, MD .  azithromycin (ZITHROMAX) 500 mg in sodium chloride 0.9 % 250 mL IVPB, 500 mg, Intravenous, Q24H, Elgergawy, Silver Huguenin, MD .  cefTRIAXone (ROCEPHIN) 1 g in sodium chloride 0.9 % 100 mL IVPB, 1 g, Intravenous, Q24H, Elgergawy, Silver Huguenin, MD, Last Rate: 200 mL/hr at 01/25/21 1325, 1 g at 01/25/21 1325 .  dextromethorphan-guaiFENesin (Lund DM) 30-600 MG per 12 hr tablet 1 tablet, 1 tablet, Oral, BID PRN, Ivor Costa, MD .  ipratropium-albuterol (DUONEB) 0.5-2.5 (3) MG/3ML nebulizer solution 3 mL, 3 mL, Nebulization, Q6H, Elgergawy, Emeline Gins S, MD .  methylPREDNISolone sodium succinate (SOLU-MEDROL) 125 mg/2 mL injection 60 mg, 60 mg, Intravenous, Q12H, Ivor Costa, MD, 60 mg at 01/25/21 3382 .  morphine 2 MG/ML injection 2 mg, 2 mg, Intravenous, Q4H PRN, Ivor Costa, MD .  nicotine (NICODERM CQ - dosed in mg/24 hours) patch 21 mg, 21 mg, Transdermal, Daily, Ivor Costa, MD, 21 mg at 01/25/21 1216 .  ondansetron (ZOFRAN) injection 4 mg, 4 mg, Intravenous, Q8H PRN, Ivor Costa, MD .  rivaroxaban (XARELTO) tablet 20 mg, 20 mg, Oral, Q supper, Ivor Costa, MD, 20 mg at 01/24/21 1717    ALLERGIES   Patient has no known allergies.  REVIEW OF SYSTEMS    Review of Systems:  Gen:  Denies  fever, sweats, chills weigh loss  HEENT: Denies blurred vision, double vision, ear pain, eye pain, hearing loss, nose bleeds, sore throat Cardiac:  No dizziness, chest pain or heaviness, chest tightness,edema Resp:   Denies cough or sputum porduction, shortness of breath,wheezing, hemoptysis,  Gi: Denies swallowing difficulty, stomach pain, nausea or vomiting, diarrhea, constipation, bowel incontinence Gu:  Denies bladder incontinence, burning  urine Ext:   Denies Joint pain, stiffness or swelling Skin: Denies  skin rash, easy bruising or bleeding or hives Endoc:  Denies polyuria, polydipsia , polyphagia or weight change Psych:   Denies depression, insomnia or hallucinations   Other:  All other systems negative   VS: BP 126/77 (BP Location: Left Arm)   Pulse 62   Temp 98 F (36.7 C)   Resp 18   Ht 5' 5"  (1.651 m)   Wt 128.4 kg   SpO2 97%   BMI 47.09 kg/m      PHYSICAL EXAM    GENERAL:NAD, no fevers, chills, no weakness no fatigue HEAD: Normocephalic, atraumatic.  EYES: Pupils equal, round, reactive to light. Extraocular muscles intact. No scleral icterus.  MOUTH: Moist mucosal membrane. Dentition intact. No abscess noted.  EAR, NOSE, THROAT: Clear without exudates. No external lesions.  NECK: Supple. No thyromegaly. No nodules. No JVD.  PULMONARY: Clear to auscultation bilaterally CARDIOVASCULAR: S1 and S2. Regular rate and rhythm. No murmurs, rubs, or gallops. No edema. Pedal pulses 2+ bilaterally.  GASTROINTESTINAL: Soft, nontender, nondistended. No masses. Positive bowel sounds. No hepatosplenomegaly.  MUSCULOSKELETAL: No swelling, clubbing, or edema. Range of motion full in all extremities.  NEUROLOGIC: Cranial nerves II through XII are intact. No gross focal neurological deficits. Sensation intact. Reflexes intact.  SKIN: No ulceration, lesions, rashes, or cyanosis. Skin warm and dry. Turgor intact.  PSYCHIATRIC: Mood, affect within normal limits. The patient is awake, alert and oriented x 3. Insight, judgment intact.       IMAGING    DG Chest 2 View  Result Date: 01/24/2021 CLINICAL DATA:  Shortness of breath. Recently diagnosed with bilateral pneumonia. Possible sepsis. EXAM: CHEST - 2 VIEW COMPARISON:  01/22/2021 FINDINGS: Normal sized heart. Clear lungs. Unremarkable bones. IMPRESSION: Normal examination. Electronically Signed   By: Claudie Revering M.D.   On: 01/24/2021 09:01   DG Chest 2  View  Result Date: 01/22/2021 CLINICAL DATA:  Shortness of breath, cough EXAM: CHEST - 2 VIEW COMPARISON:  07/13/2020 FINDINGS: Peribronchial thickening. Heart and mediastinal contours are within normal limits. No focal opacities or effusions. No acute bony abnormality. IMPRESSION: Bronchitic changes. Electronically Signed   By: Rolm Baptise M.D.   On: 01/22/2021 01:55   CT ABDOMEN PELVIS W CONTRAST  Result Date: 01/25/2021 CLINICAL DATA:  Acute generalized abdominal pain. EXAM: CT ABDOMEN AND PELVIS WITH CONTRAST TECHNIQUE: Multidetector CT imaging of the abdomen and pelvis was performed using the standard protocol following bolus administration of intravenous contrast. CONTRAST:  1106m OMNIPAQUE IOHEXOL 300 MG/ML  SOLN COMPARISON:  March 15, 2020. FINDINGS: Lower chest: No acute abnormality. Hepatobiliary: No focal liver abnormality is seen. No gallstones, gallbladder wall thickening, or biliary dilatation. Pancreas: Unremarkable. No pancreatic ductal dilatation or surrounding inflammatory changes. Spleen: Normal in size without focal abnormality. Adrenals/Urinary Tract: Adrenal glands are unremarkable. Kidneys are normal, without renal calculi, focal lesion, or hydronephrosis. Bladder is unremarkable. Stomach/Bowel: Stomach is within normal limits. Appendix appears normal. No evidence of bowel wall thickening, distention, or inflammatory changes.  Vascular/Lymphatic: No significant vascular findings are present. No enlarged abdominal or pelvic lymph nodes. Reproductive: Prostate is unremarkable. Other: No abdominal wall hernia or abnormality. No abdominopelvic ascites. Musculoskeletal: No acute or significant osseous findings. IMPRESSION: No abnormality seen in the abdomen or pelvis. Electronically Signed   By: Marijo Conception M.D.   On: 01/25/2021 12:24   DG Foot Complete Right  Result Date: 01/09/2021 CLINICAL DATA:  Acute onset sharp pain to the right heel for 2 weeks. No injury. EXAM: RIGHT FOOT  COMPLETE - 3+ VIEW COMPARISON:  None. FINDINGS: Mild degenerative changes in the first metatarsal-phalangeal joint. Prominent Achilles calcaneal spur. No evidence of acute fracture or dislocation. No focal bone lesion or bone destruction. Bone cortex appears intact. Soft tissues are unremarkable. IMPRESSION: No acute bony abnormalities.  Prominent Achilles calcaneal spur. Electronically Signed   By: Lucienne Capers M.D.   On: 01/09/2021 02:44      ASSESSMENT/PLAN   Acute hypoxemic respiratory failure  - patient is actively smoking THC and tobacco we discussed smoking cessation   - he has cystilc lung disease and we will need to investigate this further on outpatient  - He has enlarged heart CT chest and we will obtain TTE  - ANCA and ANA for possible autoimmune disease due to recurrent bronchitis  - patient is now on room air after steroids which points towards inflammatory component - RF and ESR  - resp cultures  - patient had fevers 5 days ago -agree COPD care path -patient is stable may dc when comfortable from [facility] perspective and I can follow up on outpatient.   OSA   - patient has PSG schedules        Thank you for allowing me to participate in the care of this patient.   Patient/Family are satisfied with care plan and all questions have been answered.  This document was prepared using Dragon voice recognition software and may include unintentional dictation errors.     Ottie Glazier, M.D.  Division of [facility]

## 2021-01-25 NOTE — Progress Notes (Signed)
Pt experiencing episodes of sleep apnea. Pt's O2 sats will go up to 90's once awake but then goes back down when he falls back asleep. Pt's heart rate also down to upper 40's and lower 50's during these episodes. Paged provider on call as well as RRT. Orders for Bipap placed. Will continue to monitor pt.   01/25/21 0412  Vitals  Pulse Rate (!) 47  ECG Heart Rate (!) 50  MEWS COLOR  MEWS Score Color Green  Oxygen Therapy  SpO2 (!) 62 %  O2 Device Nasal Cannula  O2 Flow Rate (L/min) 3 L/min  MEWS Score  MEWS Temp 0  MEWS Systolic 0  MEWS Pulse 1  MEWS RR 0  MEWS LOC 0  MEWS Score 1

## 2021-01-25 NOTE — Progress Notes (Signed)
Pt given contrast at this time and instructed to notify me when completed. Pt also instructed that I need a sputum sample.  Pt refuses bipap stating "It's uncomfortable, it leaks, I don't like it, I don't need it."

## 2021-01-25 NOTE — Progress Notes (Signed)
Pt states he has been unable to produce any sputum for ordered test

## 2021-01-26 ENCOUNTER — Inpatient Hospital Stay
Admit: 2021-01-26 | Discharge: 2021-01-26 | Disposition: A | Discharge status: Home / Self Care | Payer: 59 | Attending: Pulmonary Disease | Admitting: Pulmonary Disease

## 2021-01-26 LAB — ECHOCARDIOGRAM COMPLETE
AR max vel: 3.84 cm2
AV Peak grad: 4.6 mmHg
Ao pk vel: 1.07 m/s
Area-P 1/2: 3.42 cm2
Height: 65 in
S' Lateral: 3 cm
Weight: 4528 oz

## 2021-01-26 LAB — BASIC METABOLIC PANEL
Anion gap: 11 (ref 5–15)
BUN: 17 mg/dL (ref 6–20)
CO2: 27 mmol/L (ref 22–32)
Calcium: 9.2 mg/dL (ref 8.9–10.3)
Chloride: 101 mmol/L (ref 98–111)
Creatinine, Ser: 1.07 mg/dL (ref 0.61–1.24)
GFR, Estimated: >60 mL/min (ref >60)
Glucose, Bld: 223 mg/dL — ABNORMAL HIGH (ref 70–99)
Potassium: 4.2 mmol/L (ref 3.5–5.1)
Sodium: 139 mmol/L (ref 135–145)

## 2021-01-26 LAB — CBC
HCT: 45 % (ref 39.0–52.0)
Hemoglobin: 15 g/dL (ref 13.0–17.0)
MCH: 32.4 pg (ref 26.0–34.0)
MCHC: 33.3 g/dL (ref 30.0–36.0)
MCV: 97.2 fL (ref 80.0–100.0)
Platelets: 290 10*3/uL (ref 150–400)
RBC: 4.63 MIL/uL (ref 4.22–5.81)
RDW: 13.4 % (ref 11.5–15.5)
WBC: 18.9 10*3/uL — ABNORMAL HIGH (ref 4.0–10.5)
nRBC: 0 % (ref 0.0–0.2)

## 2021-01-26 LAB — ANA COMPREHENSIVE PANEL
Anti JO-1: <0.2 AI (ref 0.0–0.9)
Centromere Ab Screen: <0.2 AI (ref 0.0–0.9)
Chromatin Ab SerPl-aCnc: <0.2 AI (ref 0.0–0.9)
ENA SM Ab Ser-aCnc: <0.2 AI (ref 0.0–0.9)
Ribonucleic Protein: 0.3 AI (ref 0.0–0.9)
SSA (Ro) (ENA) Antibody, IgG: <0.2 AI (ref 0.0–0.9)
SSB (La) (ENA) Antibody, IgG: <0.2 AI (ref 0.0–0.9)
Scleroderma (Scl-70) (ENA) Antibody, IgG: <0.2 AI (ref 0.0–0.9)
ds DNA Ab: <1 IU/mL (ref 0–9)

## 2021-01-26 LAB — RHEUMATOID FACTOR: Rheumatoid fact SerPl-aCnc: <10 IU/mL (ref <14.0)

## 2021-01-26 LAB — GLUCOSE, CAPILLARY: Glucose-Capillary: 152 mg/dL — ABNORMAL HIGH (ref 70–99)

## 2021-01-26 MED ORDER — CEFDINIR 300 MG PO CAPS: 300.0000 mg | ORAL_CAPSULE | Freq: Two times a day (BID) | ORAL | 0 refills | Status: DC

## 2021-01-26 MED ORDER — PREDNISONE 10 MG (21) PO TBPK: ORAL_TABLET | ORAL | 0 refills | Status: DC

## 2021-01-26 MED ORDER — AZITHROMYCIN 500 MG PO TABS: 500.0000 mg | ORAL_TABLET | Freq: Every day | ORAL | 0 refills | Status: AC

## 2021-01-26 MED ORDER — NICOTINE 21 MG/24HR TD PT24: 21.0000 mg | MEDICATED_PATCH | Freq: Every day | TRANSDERMAL | 0 refills | Status: DC

## 2021-01-26 MED ORDER — RIVAROXABAN 20 MG PO TABS: 20.0000 mg | ORAL_TABLET | Freq: Every day | ORAL | 0 refills | Status: DC

## 2021-01-26 NOTE — Plan of Care (Signed)
  Problem: Education: Goal: Verbalization of understanding the information provided will improve Outcome: Adequate for Discharge Goal: Identification of ways to alter the environment to positively affect health status will improve Outcome: Adequate for Discharge   Problem: Activity: Goal: Ability to perform activities at highest level will improve Outcome: Adequate for Discharge   Problem: Respiratory: Goal: Respiratory status will improve Outcome: Adequate for Discharge Goal: Will regain and/or maintain adequate ventilation Outcome: Adequate for Discharge   Problem: Education: Goal: Knowledge of General Education information will improve Description: Including pain rating scale, medication(s)/side effects and non-pharmacologic comfort measures Outcome: Adequate for Discharge

## 2021-01-26 NOTE — Progress Notes (Signed)
Patient willing to try NIV again. He states he could not tolerate previously as he was too distress at the time for whatever reason. He states he does not currently use cpap at home. SVN (prn) given now as patient hopes he will be able to rest all night. No distress noted. Tolerated interventions well

## 2021-01-26 NOTE — Progress Notes (Signed)
Pt and wife seen walking off the floor for discharge despite being asked to please wait for transport with wheelchair.  Wearing his own clothes and has discharge instructions and xarelto coupon

## 2021-01-26 NOTE — Discharge Instructions (Signed)
Follow with Primary MD in 7 days   Get CBC, CMP, 2 view Chest X ray checked  by Primary MD next visit.    Activity: As tolerated with Full fall precautions use walker/cane & assistance as needed   Disposition Home    Diet: Heart Healthy .   On your next visit with your primary care physician please Get Medicines reviewed and adjusted.   Please request your Prim.MD to go over all Hospital Tests and Procedure/Radiological results at the follow up, please get all Hospital records sent to your Prim MD by signing hospital release before you go home.   If you experience worsening of your admission symptoms, develop shortness of breath, life threatening emergency, suicidal or homicidal thoughts you must seek medical attention immediately by calling 911 or calling your MD immediately  if symptoms less severe.  You Must read complete instructions/literature along with all the possible adverse reactions/side effects for all the Medicines you take and that have been prescribed to you. Take any new Medicines after you have completely understood and accpet all the possible adverse reactions/side effects.   Do not drive, operating heavy machinery, perform activities at heights, swimming or participation in water activities or provide baby sitting services if your were admitted for syncope or siezures until you have seen by Primary MD or a Neurologist and advised to do so again.  Do not drive when taking Pain medications.    Do not take more than prescribed Pain, Sleep and Anxiety Medications  Special Instructions: If you have smoked or chewed Tobacco  in the last 2 yrs please stop smoking, stop any regular Alcohol  and or any Recreational drug use.  Wear Seat belts while driving.   Please note  You were cared for by a hospitalist during your hospital stay. If you have any questions about your discharge medications or the care you received while you were in the hospital after you are  discharged, you can call the unit and asked to speak with the hospitalist on call if the hospitalist that took care of you is not available. Once you are discharged, your primary care physician will handle any further medical issues. Please note that NO REFILLS for any discharge medications will be authorized once you are discharged, as it is imperative that you return to your primary care physician (or establish a relationship with a primary care physician if you do not have one) for your aftercare needs so that they can reassess your need for medications and monitor your lab values.  

## 2021-01-26 NOTE — Progress Notes (Signed)
*  PRELIMINARY RESULTS* Echocardiogram 2D Echocardiogram has been performed.  Lenor Coffin 01/26/2021, 11:30 AM

## 2021-01-26 NOTE — Discharge Summary (Signed)
Physician Discharge Summary  Dalton Pena SJG:[id number] DOB: 1981/03/28 DOA: 01/24/2021  PCP: Pcp, No  Admit date: 01/24/2021 Discharge date: 01/26/2021  Admitted From: Home Disposition:  Home  Recommendations for Outpatient Follow-up:  1. Follow up with PCP in 1-2 weeks 2. Please obtain BMP/CBC in one week 3. Please follow up on the following pending results:ANA, ANCA titers  Home Health:NO Equipment/Devices None  Discharge Condition:Stable CODE STATUS:FULL Diet recommendation: Heart Healthy   Brief/Interim Summary:    Discharge Diagnoses:  Principal Problem:   Acute respiratory failure with hypoxia (HCC) Active Problems:   Acute deep vein thrombosis (DVT) of right femoral vein (HCC)   Obesity, Class III, BMI 40-49.9 (morbid obesity) (HCC)   Pulmonary embolism (HCC)   Tobacco abuse   Abdominal pain   Nausea vomiting and diarrhea   Chest pain   COPD exacerbation (HCC)     Acute respiratory failure with hypoxia (HCC)due topossibleundiagnosed COPD with acute exacerbation: -Patient 86% on room air, requiring 2 L nasal cannula initially, he is back to room air today . -Recent imaging significant for a typical opacity with some groundglass endings, be related to atypical pneumonia .  Pulmonary input greatly appreciated, will autoimmune disease, due to recurrent bronchitis, ANCA and ANA is pending, as well possibly related to COPD flare, was treated with IV Rocephin, azithromycin, and IV Solu-Medrol, significant improvement of symptoms, has no further hypoxia, report dyspnea significantly improved, he will be discharged on p.o. Omnicef and azithromycin for 3 days, and prednisone taper. -To follow with pulmonary as an outpatient.   History of recurrent DVT/PE . -He has not been taking his Xarelto, he was resumed on it .  His manager will provide him with 30 days free card of Xarelto.  Chest pain: -Has nontypical, most likely related to cough, troponins none ACS  pattern > 8 > 7 > 5 . -Recent CTA chest negative for PE   Obesity, Class III, BMI 40-49.9 (morbid obesity) (HCC) -Diet and exercise.  -Encouraged to lose weight.  Tobacco abuse -He was counseled  OSA -Patient has PSG scheduled  Abdominal pain, nausea vomiting and diarrhea:  -lipase normal, 24.  -CT abdomen pelvis with no acute finding. -Reports his symptoms have significantly improved.    Discharge Instructions  Discharge Instructions    Diet - low sodium heart healthy   Complete by: As directed    Discharge instructions   Complete by: As directed    Follow with Primary MD in 7 days   Get CBC, CMP, 2 view Chest X ray checked  by Primary MD next visit.    Activity: As tolerated with Full fall precautions use walker/cane & assistance as needed   Disposition Home   Diet: Heart Healthy     On your next visit with your primary care physician please Get Medicines reviewed and adjusted.   Please request your Prim.MD to go over all Hospital Tests and Procedure/Radiological results at the follow up, please get all Hospital records sent to your Prim MD by signing hospital release before you go home.   If you experience worsening of your admission symptoms, develop shortness of breath, life threatening emergency, suicidal or homicidal thoughts you must seek medical attention immediately by calling 911 or calling your MD immediately  if symptoms less severe.  You Must read complete instructions/literature along with all the possible adverse reactions/side effects for all the Medicines you take and that have been prescribed to you. Take any new Medicines after you have completely understood  and accpet all the possible adverse reactions/side effects.   Do not drive, operating heavy machinery, perform activities at heights, swimming or participation in water activities or provide baby sitting services if your were admitted for syncope or siezures until you have seen by  Primary MD or a Neurologist and advised to do so again.  Do not drive when taking Pain medications.    Do not take more than prescribed Pain, Sleep and Anxiety Medications  Special Instructions: If you have smoked or chewed Tobacco  in the last 2 yrs please stop smoking, stop any regular Alcohol  and or any Recreational drug use.  Wear Seat belts while driving.   Please note  You were cared for by a hospitalist during your hospital stay. If you have any questions about your discharge medications or the care you received while you were in the hospital after you are discharged, you can call the unit and asked to speak with the hospitalist on call if the hospitalist that took care of you is not available. Once you are discharged, your primary care physician will handle any further medical issues. Please note that NO REFILLS for any discharge medications will be authorized once you are discharged, as it is imperative that you return to your primary care physician (or establish a relationship with a primary care physician if you do not have one) for your aftercare needs so that they can reassess your need for medications and monitor your lab values.   Increase activity slowly   Complete by: As directed      Allergies as of 01/26/2021   No Known Allergies     Medication List    STOP taking these medications   diclofenac 75 MG EC tablet Commonly known as: VOLTAREN   doxycycline 100 MG capsule Commonly known as: VIBRAMYCIN   predniSONE 10 MG tablet Commonly known as: DELTASONE Replaced by: predniSONE 10 MG (21) Tbpk tablet     TAKE these medications   azithromycin 500 MG tablet Commonly known as: Zithromax Take 1 tablet (500 mg total) by mouth daily for 3 days. Take 1 tablet daily for 3 days. Start taking on: January 27, 2021   benzonatate 100 MG capsule Commonly known as: TESSALON Take 100 mg by mouth 3 (three) times daily as needed for cough.   cefdinir 300 MG capsule Commonly  known as: OMNICEF Take 1 capsule (300 mg total) by mouth 2 (two) times daily. Start taking on: January 27, 2021   predniSONE 10 MG (21) Tbpk tablet Commonly known as: STERAPRED UNI-PAK 21 TAB Use per package instructions Replaces: predniSONE 10 MG tablet   rivaroxaban 20 MG Tabs tablet Commonly known as: XARELTO Take 1 tablet (20 mg total) by mouth daily with supper.       Follow-up Information    Vida Rigger, MD Follow up.   Specialty: Pulmonary Disease Contact information: [address] [place] [address] [phone]              No Known Allergies  Consultations:  Pulmonary   Procedures/Studies: DG Chest 2 View  Result Date: 01/24/2021 CLINICAL DATA:  Shortness of breath. Recently diagnosed with bilateral pneumonia. Possible sepsis. EXAM: CHEST - 2 VIEW COMPARISON:  01/22/2021 FINDINGS: Normal sized heart. Clear lungs. Unremarkable bones. IMPRESSION: Normal examination. Electronically Signed   By: Beckie Salts M.D.   On: 01/24/2021 09:01   DG Chest 2 View  Result Date: 01/22/2021 CLINICAL DATA:  Shortness of breath, cough EXAM: CHEST - 2  VIEW COMPARISON:  07/13/2020 FINDINGS: Peribronchial thickening. Heart and mediastinal contours are within normal limits. No focal opacities or effusions. No acute bony abnormality. IMPRESSION: Bronchitic changes. Electronically Signed   By: Charlett Nose M.D.   On: 01/22/2021 01:55   CT ABDOMEN PELVIS W CONTRAST  Result Date: 01/25/2021 CLINICAL DATA:  Acute generalized abdominal pain. EXAM: CT ABDOMEN AND PELVIS WITH CONTRAST TECHNIQUE: Multidetector CT imaging of the abdomen and pelvis was performed using the standard protocol following bolus administration of intravenous contrast. CONTRAST:  OMNIPAQUE IOHEXOL 300 MG/ML  SOLN COMPARISON:  March 15, 2020. FINDINGS: Lower chest: No acute abnormality. Hepatobiliary: No focal liver abnormality is seen. No gallstones, gallbladder wall thickening, or biliary  dilatation. Pancreas: Unremarkable. No pancreatic ductal dilatation or surrounding inflammatory changes. Spleen: Normal in size without focal abnormality. Adrenals/Urinary Tract: Adrenal glands are unremarkable. Kidneys are normal, without renal calculi, focal lesion, or hydronephrosis. Bladder is unremarkable. Stomach/Bowel: Stomach is within normal limits. Appendix appears normal. No evidence of bowel wall thickening, distention, or inflammatory changes. Vascular/Lymphatic: No significant vascular findings are present. No enlarged abdominal or pelvic lymph nodes. Reproductive: Prostate is unremarkable. Other: No abdominal wall hernia or abnormality. No abdominopelvic ascites. Musculoskeletal: No acute or significant osseous findings. IMPRESSION: No abnormality seen in the abdomen or pelvis. Electronically Signed   By: Lupita Raider M.D.   On: 01/25/2021 12:24   DG Foot Complete Right  Result Date: 01/09/2021 CLINICAL DATA:  Acute onset sharp pain to the right heel for 2 weeks. No injury. EXAM: RIGHT FOOT COMPLETE - 3+ VIEW COMPARISON:  None. FINDINGS: Mild degenerative changes in the first metatarsal-phalangeal joint. Prominent Achilles calcaneal spur. No evidence of acute fracture or dislocation. No focal bone lesion or bone destruction. Bone cortex appears intact. Soft tissues are unremarkable. IMPRESSION: No acute bony abnormalities.  Prominent Achilles calcaneal spur. Electronically Signed   By: Burman Nieves M.D.   On: 01/09/2021 02:44       Subjective:   Discharge Exam: Vitals:   01/26/21 0745 01/26/21 0755  BP:  (!) 147/82  Pulse:  64  Resp:  15  Temp:  (!) 97.5 F (36.4 C)  SpO2: 98% 94%   Vitals:   01/26/21 0600 01/26/21 0704 01/26/21 0745 01/26/21 0755  BP: 127/68   (!) 147/82  Pulse: 63   64  Resp: 16   15  Temp: 98.2 F (36.8 C)   (!) 97.5 F (36.4 C)  TempSrc: Oral   Oral  SpO2: 98% 97% 98% 94%  Weight:      Height:        General: Pt is alert, awake, not in  acute distress Cardiovascular: RRR, S1/S2 +, no rubs, no gallops Respiratory: CTA bilaterally, no wheezing, no rhonchi Abdominal: Soft, NT, ND, bowel sounds + Extremities: no edema, no cyanosis    The results of significant diagnostics from this hospitalization (including imaging, microbiology, ancillary and laboratory) are listed below for reference.     Microbiology: Recent Results (from the past 240 hour(s))  Resp Panel by RT-PCR (Flu A&B, Covid) Nasopharyngeal Swab     Status: None   Collection Time: 01/24/21  8:40 AM   Specimen: Nasopharyngeal Swab; Nasopharyngeal(NP) swabs in vial transport medium  Result Value Ref Range Status   SARS Coronavirus 2 by RT PCR NEGATIVE NEGATIVE Final    Comment: (NOTE) SARS-CoV-2 target nucleic acids are NOT DETECTED.  The SARS-CoV-2 RNA is generally detectable in upper respiratory specimens during the acute phase of infection. The  lowest concentration of SARS-CoV-2 viral copies this assay can detect is 138 copies/mL. A negative result does not preclude SARS-Cov-2 infection and should not be used as the sole basis for treatment or other patient management decisions. A negative result may occur with  improper specimen collection/handling, submission of specimen other than nasopharyngeal swab, presence of viral mutation(s) within the areas targeted by this assay, and inadequate number of viral copies(<138 copies/mL). A negative result must be combined with clinical observations, patient history, and epidemiological information. The expected result is Negative.  Fact Sheet for Patients:  BloggerCourse.com  Fact Sheet for Healthcare Providers:  SeriousBroker.it  This test is no t yet approved or cleared by the [place] FDA and  has been authorized for detection and/or diagnosis of SARS-CoV-2 by FDA under an Emergency Use Authorization (EUA). This EUA will remain  in effect (meaning this  test can be used) for the duration of the COVID-19 declaration under Section 564(b)(1) of the Act, 21 U.S.C.section 360bbb-3(b)(1), unless the authorization is terminated  or revoked sooner.       Influenza A by PCR NEGATIVE NEGATIVE Final   Influenza B by PCR NEGATIVE NEGATIVE Final    Comment: (NOTE) The Xpert Xpress SARS-CoV-2/FLU/RSV plus assay is intended as an aid in the diagnosis of influenza from Nasopharyngeal swab specimens and should not be used as a sole basis for treatment. Nasal washings and aspirates are unacceptable for Xpert Xpress SARS-CoV-2/FLU/RSV testing.  Fact Sheet for Patients: BloggerCourse.com  Fact Sheet for Healthcare Providers: SeriousBroker.it  This test is not yet approved or cleared by the [place] FDA and has been authorized for detection and/or diagnosis of SARS-CoV-2 by FDA under an Emergency Use Authorization (EUA). This EUA will remain in effect (meaning this test can be used) for the duration of the COVID-19 declaration under Section 564(b)(1) of the Act, 21 U.S.C. section 360bbb-3(b)(1), unless the authorization is terminated or revoked.  Performed at [facility], [address] [place], [place] [address]   Blood Culture (routine x 2)     Status: None (Preliminary result)   Collection Time: 01/24/21  8:41 AM   Specimen: BLOOD  Result Value Ref Range Status   Specimen Description BLOOD RIGHT ANTECUBITAL  Final   Special Requests   Final    BOTTLES DRAWN AEROBIC AND ANAEROBIC Blood Culture adequate volume   Culture   Final    NO GROWTH 2 DAYS Performed at [facility], [address] [place], [place] [address]    Report Status PENDING  Incomplete  Blood Culture (routine x 2)     Status: None (Preliminary result)   Collection Time: 01/24/21  8:41 AM   Specimen: BLOOD  Result Value Ref Range Status   Specimen Description BLOOD RIGHT ANTECUBITAL  Final    Special Requests   Final    BOTTLES DRAWN AEROBIC AND ANAEROBIC Blood Culture results may not be optimal due to an inadequate volume of blood received in culture bottles   Culture   Final    NO GROWTH 2 DAYS Performed at [facility], [address] [place], [place] [address]    Report Status PENDING  Incomplete  Expectorated Sputum Assessment w Gram Stain, Rflx to Resp Cult     Status: None   Collection Time: 01/25/21  5:36 PM   Specimen: Sputum  Result Value Ref Range Status   Specimen Description SPUTUM  Final   Special Requests NONE  Final   Sputum evaluation   Final  Sputum specimen not acceptable for testing.  Please recollect.   Performed at [facility], [address] [place], [place] [address]    Report Status 01/25/2021 FINAL  Final     Labs: BNP (last 3 results) Recent Labs    03/15/20 0624 01/24/21 0840  BNP 28.9 10.3   Basic Metabolic Panel: Recent Labs  Lab 01/22/21 0212 01/24/21 0841 01/25/21 0503 01/26/21 0416  NA 135 136 137 139  K 3.9 3.6 4.3 4.2  CL 100 103 104 101  CO2 26 24 25 27   GLUCOSE 109* 137* 196* 223*  BUN 14 15 15 17   CREATININE 1.08 1.29* 0.98 1.07  CALCIUM 8.6* 8.4* 9.0 9.2   Liver Function Tests: Recent Labs  Lab 01/24/21 0841  AST 26  ALT 26  ALKPHOS 46  BILITOT 0.8  PROT 7.5  ALBUMIN 3.6   Recent Labs  Lab 01/24/21 1032  LIPASE 24   No results for input(s): AMMONIA in the last 168 hours. CBC: Recent Labs  Lab 01/22/21 0212 01/24/21 0841 01/25/21 0503 01/26/21 0416  WBC 13.2* 12.1* 15.0* 18.9*  NEUTROABS 10.7* 7.8*  --   --   HGB 15.3 15.7 16.0 15.0  HCT 45.6 46.3 46.1 45.0  MCV 97.4 95.9 95.6 97.2  PLT 278 260 285 290   Cardiac Enzymes: No results for input(s): CKTOTAL, CKMB, CKMBINDEX, TROPONINI in the last 168 hours. BNP: Invalid input(s): POCBNP CBG: Recent Labs  Lab 01/25/21 0855 01/25/21 1206 01/25/21 1556 01/25/21 1951 01/26/21 0755  GLUCAP 182* 207* 157* 192* 152*    D-Dimer No results for input(s): DDIMER in the last 72 hours. Hgb A1c Recent Labs    01/25/21 0503  HGBA1C 6.2*   Lipid Profile Recent Labs    01/25/21 0503  CHOL 171  HDL 27*  LDLCALC 125*  TRIG 93  CHOLHDL 6.3   Thyroid function studies No results for input(s): TSH, T4TOTAL, T3FREE, THYROIDAB in the last 72 hours.  Invalid input(s): FREET3 Anemia work up No results for input(s): VITAMINB12, FOLATE, FERRITIN, TIBC, IRON, RETICCTPCT in the last 72 hours. Urinalysis    Component Value Date/Time   COLORURINE YELLOW (A) 03/14/2020 1945   APPEARANCEUR HAZY (A) 03/14/2020 1945   LABSPEC 1.028 03/14/2020 1945   PHURINE 5.0 03/14/2020 1945   GLUCOSEU NEGATIVE 03/14/2020 1945   HGBUR NEGATIVE 03/14/2020 1945   BILIRUBINUR NEGATIVE 03/14/2020 1945   KETONESUR NEGATIVE 03/14/2020 1945   PROTEINUR NEGATIVE 03/14/2020 1945   NITRITE NEGATIVE 03/14/2020 1945   LEUKOCYTESUR NEGATIVE 03/14/2020 1945   Sepsis Labs Invalid input(s): PROCALCITONIN,  WBC,  LACTICIDVEN Microbiology Recent Results (from the past 240 hour(s))  Resp Panel by RT-PCR (Flu A&B, Covid) Nasopharyngeal Swab     Status: None   Collection Time: 01/24/21  8:40 AM   Specimen: Nasopharyngeal Swab; Nasopharyngeal(NP) swabs in vial transport medium  Result Value Ref Range Status   SARS Coronavirus 2 by RT PCR NEGATIVE NEGATIVE Final    Comment: (NOTE) SARS-CoV-2 target nucleic acids are NOT DETECTED.  The SARS-CoV-2 RNA is generally detectable in upper respiratory specimens during the acute phase of infection. The lowest concentration of SARS-CoV-2 viral copies this assay can detect is 138 copies/mL. A negative result does not preclude SARS-Cov-2 infection and should not be used as the sole basis for treatment or other patient management decisions. A negative result may occur with  improper specimen collection/handling, submission of specimen other than nasopharyngeal swab, presence of viral mutation(s)  within the areas targeted by this  assay, and inadequate number of viral copies(<138 copies/mL). A negative result must be combined with clinical observations, patient history, and epidemiological information. The expected result is Negative.  Fact Sheet for Patients:  BloggerCourse.com  Fact Sheet for Healthcare Providers:  SeriousBroker.it  This test is no t yet approved or cleared by the [place] FDA and  has been authorized for detection and/or diagnosis of SARS-CoV-2 by FDA under an Emergency Use Authorization (EUA). This EUA will remain  in effect (meaning this test can be used) for the duration of the COVID-19 declaration under Section 564(b)(1) of the Act, 21 U.S.C.section 360bbb-3(b)(1), unless the authorization is terminated  or revoked sooner.       Influenza A by PCR NEGATIVE NEGATIVE Final   Influenza B by PCR NEGATIVE NEGATIVE Final    Comment: (NOTE) The Xpert Xpress SARS-CoV-2/FLU/RSV plus assay is intended as an aid in the diagnosis of influenza from Nasopharyngeal swab specimens and should not be used as a sole basis for treatment. Nasal washings and aspirates are unacceptable for Xpert Xpress SARS-CoV-2/FLU/RSV testing.  Fact Sheet for Patients: BloggerCourse.com  Fact Sheet for Healthcare Providers: SeriousBroker.it  This test is not yet approved or cleared by the [place] FDA and has been authorized for detection and/or diagnosis of SARS-CoV-2 by FDA under an Emergency Use Authorization (EUA). This EUA will remain in effect (meaning this test can be used) for the duration of the COVID-19 declaration under Section 564(b)(1) of the Act, 21 U.S.C. section 360bbb-3(b)(1), unless the authorization is terminated or revoked.  Performed at [facility], [address] [place], [place] [address]   Blood Culture (routine x 2)     Status: None  (Preliminary result)   Collection Time: 01/24/21  8:41 AM   Specimen: BLOOD  Result Value Ref Range Status   Specimen Description BLOOD RIGHT ANTECUBITAL  Final   Special Requests   Final    BOTTLES DRAWN AEROBIC AND ANAEROBIC Blood Culture adequate volume   Culture   Final    NO GROWTH 2 DAYS Performed at [facility], [address] [place], [place] [address]    Report Status PENDING  Incomplete  Blood Culture (routine x 2)     Status: None (Preliminary result)   Collection Time: 01/24/21  8:41 AM   Specimen: BLOOD  Result Value Ref Range Status   Specimen Description BLOOD RIGHT ANTECUBITAL  Final   Special Requests   Final    BOTTLES DRAWN AEROBIC AND ANAEROBIC Blood Culture results may not be optimal due to an inadequate volume of blood received in culture bottles   Culture   Final    NO GROWTH 2 DAYS Performed at [facility], [address], [place], [place] [address]    Report Status PENDING  Incomplete  Expectorated Sputum Assessment w Gram Stain, Rflx to Resp Cult     Status: None   Collection Time: 01/25/21  5:36 PM   Specimen: Sputum  Result Value Ref Range Status   Specimen Description SPUTUM  Final   Special Requests NONE  Final   Sputum evaluation   Final    Sputum specimen not acceptable for testing.  Please recollect.   Performed at [facility], [address] [place], [place] [address]    Report Status 01/25/2021 FINAL  Final     Time coordinating discharge: Over 30 minutes  SIGNED:   Huey Bienenstock, MD  [facility] 01/26/2021, 11:52 AM Pager   If 7PM-7AM, please contact  night-coverage [email or web address] Password [email or web address]

## 2021-01-28 LAB — ANCA TITERS
Atypical P-ANCA titer: <1:20 {titer}
C-ANCA: <1:20 {titer}
P-ANCA: <1:20 {titer}

## 2021-01-29 LAB — CULTURE, BLOOD (ROUTINE X 2)
Culture: NO GROWTH
Culture: NO GROWTH
Special Requests: ADEQUATE

## 2021-04-14 ENCOUNTER — Encounter: Payer: Self-pay | Admitting: Emergency Medicine

## 2021-04-14 ENCOUNTER — Other Ambulatory Visit: Payer: Self-pay

## 2021-04-14 ENCOUNTER — Emergency Department
Admission: EM | Admit: 2021-04-14 | Discharge: 2021-04-14 | Disposition: A | Discharge status: Home / Self Care | Payer: 59 | Attending: Emergency Medicine | Admitting: Emergency Medicine

## 2021-04-14 ENCOUNTER — Emergency Department: Payer: 59

## 2021-04-14 DIAGNOSIS — Z79899 Other long term (current) drug therapy: Secondary | ICD-10-CM | POA: Insufficient documentation

## 2021-04-14 DIAGNOSIS — F17210 Nicotine dependence, cigarettes, uncomplicated: Secondary | ICD-10-CM | POA: Insufficient documentation

## 2021-04-14 DIAGNOSIS — I10 Essential (primary) hypertension: Secondary | ICD-10-CM | POA: Insufficient documentation

## 2021-04-14 DIAGNOSIS — Z28310 Unvaccinated for covid-19: Secondary | ICD-10-CM | POA: Insufficient documentation

## 2021-04-14 DIAGNOSIS — J449 Chronic obstructive pulmonary disease, unspecified: Secondary | ICD-10-CM | POA: Insufficient documentation

## 2021-04-14 DIAGNOSIS — Z7901 Long term (current) use of anticoagulants: Secondary | ICD-10-CM | POA: Insufficient documentation

## 2021-04-14 DIAGNOSIS — U071 COVID-19: Secondary | ICD-10-CM

## 2021-04-14 LAB — CBC
HCT: 44.8 % (ref 39.0–52.0)
Hemoglobin: 15.3 g/dL (ref 13.0–17.0)
MCH: 32.8 pg (ref 26.0–34.0)
MCHC: 34.2 g/dL (ref 30.0–36.0)
MCV: 96.1 fL (ref 80.0–100.0)
Platelets: 239 10*3/uL (ref 150–400)
RBC: 4.66 MIL/uL (ref 4.22–5.81)
RDW: 13.7 % (ref 11.5–15.5)
WBC: 9.7 10*3/uL (ref 4.0–10.5)
nRBC: 0 % (ref 0.0–0.2)

## 2021-04-14 LAB — BASIC METABOLIC PANEL
Anion gap: 9 (ref 5–15)
BUN: 16 mg/dL (ref 6–20)
CO2: 25 mmol/L (ref 22–32)
Calcium: 8.8 mg/dL — ABNORMAL LOW (ref 8.9–10.3)
Chloride: 102 mmol/L (ref 98–111)
Creatinine, Ser: 1.17 mg/dL (ref 0.61–1.24)
GFR, Estimated: >60 mL/min (ref >60)
Glucose, Bld: 110 mg/dL — ABNORMAL HIGH (ref 70–99)
Potassium: 3.7 mmol/L (ref 3.5–5.1)
Sodium: 136 mmol/L (ref 135–145)

## 2021-04-14 LAB — RESP PANEL BY RT-PCR (FLU A&B, COVID) ARPGX2
Influenza A by PCR: NEGATIVE
Influenza B by PCR: NEGATIVE
SARS Coronavirus 2 by RT PCR: POSITIVE — AB

## 2021-04-14 LAB — TROPONIN I (HIGH SENSITIVITY): Troponin I (High Sensitivity): 5 ng/L (ref <18)

## 2021-04-14 MED ORDER — NIRMATRELVIR/RITONAVIR (PAXLOVID)TABLET: 3.0000 | ORAL_TABLET | Freq: Two times a day (BID) | ORAL | 0 refills | Status: AC

## 2021-04-14 NOTE — Discharge Instructions (Addendum)
You may take Tylenol 1000 mg every 6 hours as needed for pain or fever.  I recommend that she start paxlovid in the morning.  You are high risk for complications from COVID-19 due to obesity and your previous history of pulmonary emboli, COPD.  Your blood pressure today improved without intervention.  I suspect the reason you are feeling bad is that your COVID-19 test was positive.  You will need to quarantine for at least 5 days after the onset of symptoms.  If you are fever free and feeling better at the end of quarantine, you may come out of quarantine but we recommend that you wear a mask when around others for the next 5 days afterwards.  If at the end of the first 5 days of quarantine you are still feeling poorly or have fever, please quarantine for a full 10 days.   Steps to find a Primary Care Provider (PCP):  Call [phone] or [phone] to access "[place] Find a Doctor Service."  2.  You may also go on the [facility] website at [email or web address]

## 2021-04-14 NOTE — ED Notes (Signed)
Patient left without getting his discharge instructions or getting his prescription.  ED Provider was informed

## 2021-04-14 NOTE — ED Provider Notes (Signed)
[facility] Emergency Department Provider Note  ____________________________________________   Event Date/Time   First MD Initiated Contact with Patient 04/14/21 [phone]     (approximate)  I have reviewed the triage vital signs and the nursing notes.   HISTORY  Chief Complaint Hypertension    HPI Dalton Pena is a 40 y.o. male with history of PE, DVT on Xarelto, COPD, tobacco use, obesity who presents to the emergency department with concerns that his blood pressure was high because he was not feeling right.  When asked to describe this further patient states that he felt hot, cold and had chills.  No known fevers, cough, sore throat, ear pain, chest pain, difficulty breathing, nausea, vomiting, diarrhea, dysuria, hematuria.  No tick bite or rash.  No recent travel.  No known sick contacts.  Has not been vaccinated against COVID-19.  Denies headache, neck pain or neck stiffness.  No vision changes.  No numbness, tingling or weakness.        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 No pertinent surgical history.  Prior to Admission medications   Medication Sig Start Date End Date Taking? Authorizing Provider   nirmatrelvir/ritonavir EUA (PAXLOVID) TABS Take 3 tablets by mouth 2 (two) times daily for 5 days. Patient GFR is 60. Take nirmatrelvir (150 mg) two tablets twice daily for 5 days and ritonavir (100 mg) one tablet twice daily for 5 days. 04/14/21 04/19/21 Yes Calisha Tindel, Layla Maw, DO  benzonatate (TESSALON) 100 MG capsule Take 100 mg by mouth 3 (three) times daily as needed for cough.    [provider]  cefdinir (OMNICEF) 300 MG capsule Take 1 capsule (300 mg total) by mouth 2 (two) times daily. 01/27/21   Elgergawy, Leana Roe, MD  nicotine (NICODERM CQ - DOSED IN MG/24 HOURS) 21 mg/24hr patch Place 1 patch (21 mg total) onto the skin daily. 01/27/21   Elgergawy, Leana Roe, MD  predniSONE (STERAPRED UNI-PAK 21 TAB) 10 MG (21) TBPK tablet Use per package instructions 01/26/21   Elgergawy, Leana Roe, MD  rivaroxaban (XARELTO) 20 MG TABS tablet Take 1 tablet (20 mg total) by mouth daily with supper. 01/26/21   Elgergawy, Leana Roe, MD    Allergies Patient has no known allergies.  Family History  Problem Relation Age of Onset   Heart failure Father     Social History Social History   Tobacco Use   Smoking status: Some Days    Pack years: 0.00    Types: Cigarettes   Smokeless tobacco: Never  Vaping Use   Vaping Use: Every day  Substance Use Topics   Alcohol use: Yes    Comment: occ.   Drug use: Yes    Types:  Marijuana    Review of Systems Constitutional: No fever. Eyes: No visual changes. ENT: No sore throat. Cardiovascular: Denies chest pain. Respiratory: Denies shortness of breath. Gastrointestinal: No nausea, vomiting, diarrhea. Genitourinary: Negative for dysuria. Musculoskeletal: Negative for back pain. Skin: Negative for rash. Neurological: Negative for focal weakness or numbness.  ____________________________________________   PHYSICAL EXAM:  VITAL SIGNS: ED Triage Vitals  Enc Vitals Group     BP 04/14/21 0036 (!) 145/82     Pulse Rate 04/14/21 0036 92     Resp  04/14/21 0036 18     Temp 04/14/21 0036 100 F (37.8 C)     Temp Source 04/14/21 0036 Oral     SpO2 04/14/21 0036 97 %     Weight 04/14/21 0036 285 lb (129.3 kg)     Height 04/14/21 0036 5\' 5"  (1.651 m)     Head Circumference --      Peak Flow --      Pain Score 04/14/21 0058 0     Pain Loc --      Pain Edu? --      Excl. in GC? --    CONSTITUTIONAL: Alert and oriented and responds appropriately to questions. Well-appearing; well-nourished HEAD: Normocephalic EYES: Conjunctivae clear, pupils appear equal, EOM appear intact ENT: normal nose; moist mucous membranes; No pharyngeal erythema or petechiae, no tonsillar hypertrophy or exudate, no uvular deviation, no unilateral swelling, no trismus or drooling, no muffled voice, normal phonation, no stridor, no dental caries present, no drainable dental abscess noted, no Ludwig's angina, tongue sits flat in the bottom of the mouth, no angioedema, no facial erythema or warmth, no facial swelling; no pain with movement of the neck, no cervical LAD. NECK: Supple, normal ROM CARD: RRR; S1 and S2 appreciated; no murmurs, no clicks, no rubs, no gallops RESP: Normal chest excursion without splinting or tachypnea; breath sounds clear and equal bilaterally; no wheezes, no rhonchi, no rales, no hypoxia or respiratory distress, speaking full sentences ABD/GI: Normal bowel sounds; non-distended; soft, non-tender, no rebound, no guarding, no peritoneal signs, no hepatosplenomegaly BACK: The back appears normal EXT: Normal ROM in all joints; no deformity noted, no edema; no cyanosis SKIN: Normal color for age and race; warm; no rash on exposed skin NEURO: Moves all extremities equally PSYCH: The patient's mood and manner are appropriate.  ____________________________________________   LABS (all labs ordered are listed, but only abnormal results are displayed)  Labs Reviewed  RESP PANEL BY RT-PCR (FLU A&B, COVID) ARPGX2 - Abnormal; Notable for the  following components:      Result Value   SARS Coronavirus 2 by RT PCR POSITIVE (*)    All other components within normal limits  BASIC METABOLIC PANEL - Abnormal; Notable for the following components:   Glucose, Bld 110 (*)    Calcium 8.8 (*)    All other components within normal limits  CBC  URINALYSIS, COMPLETE (UACMP) WITH MICROSCOPIC  URINE DRUG SCREEN, QUALITATIVE ([facility] ONLY)  TROPONIN I (HIGH SENSITIVITY)   ____________________________________________  EKG   EKG Interpretation  Date/Time:  Sunday April 14 2021 00:41:22 EDT Ventricular Rate:  91 PR Interval:  158 QRS Duration: 86 QT Interval:  342 QTC Calculation: 420 R Axis:   0 Text Interpretation: Normal sinus rhythm Normal ECG Confirmed by 12[birth year]1986 [phone]) on 04/14/2021 4:42:57 AM         ____________________________________________  RADIOLOGY 06/15/2021 Ovetta Bazzano, personally viewed and evaluated these images (plain radiographs) as part of my medical decision making,  as well as reviewing the written report by the radiologist.  ED MD interpretation: Chest x-ray clear.  Official radiology report(s): DG Chest 2 View  Result Date: 04/14/2021 CLINICAL DATA:  40 year old male with shortness of breath. EXAM: CHEST - 2 VIEW COMPARISON:  Chest radiograph dated 01/24/2021. FINDINGS: The heart size and mediastinal contours are within normal limits. Both lungs are clear. The visualized skeletal structures are unremarkable. IMPRESSION: No active cardiopulmonary disease. Electronically Signed   By: Elgie CollardArash  Radparvar M.D.   On: 04/14/2021 01:28    ____________________________________________   PROCEDURES  Procedure(s) performed (including Critical Care):  Procedures   ____________________________________________   INITIAL IMPRESSION / ASSESSMENT AND PLAN / ED COURSE  As part of my medical decision making, I reviewed the following data within the electronic MEDICAL RECORD NUMBER Nursing notes reviewed and incorporated,  Labs reviewed , EKG interpreted , Old EKG reviewed, Old chart reviewed, Radiograph reviewed , and Notes from prior ED visits         Patient here with concerns that his blood pressure was elevated because he was not feeling well and having chills.  Temperature of 100 here.  COVID swab obtained and he is COVID-positive.  Labs unremarkable.  Chest x-ray clear.  No chest pain, shortness of breath, hypoxia, respiratory distress.  Urinalysis and urine drug screen ordered but patient declines providing us a urine sample today.  His blood pressure is currently 119/48.  I feel he is safe to be discharged.  Given he does have significant risk factors including obesity, COPD, PE I have recommended/offered monoclonal antibodies versus remdesivir versus paxlovid.  Patient refuses all of these.  I have discussed with him that I would recommend that he at least start antiviral medications given he is unvaccinated.  I will provide him with a prescription with instructions to start this medication as soon as possible.  Discussed supportive care instructions.  Discussed the importance of quarantine and isolation.  States he does not need a work note at this time.  Will discharge.  At this time, I do not feel there is any life-threatening condition present. I have reviewed, interpreted and discussed all results (EKG, imaging, lab, urine as appropriate) and exam findings with patient/family. I have reviewed nursing notes and appropriate previous records.  I feel the patient is safe to be discharged home without further emergent workup and can continue workup as an outpatient as needed. Discussed usual and customary return precautions. Patient/family verbalize understanding and are comfortable with this plan.  Outpatient follow-up has been provided as needed. All questions have been answered.  ____________________________________________   FINAL CLINICAL IMPRESSION(S) / ED DIAGNOSES  Final diagnoses:  COVID-19      ED Discharge Orders          Ordered    nirmatrelvir/ritonavir EUA (PAXLOVID) TABS  2 times daily        04/14/21 0429            *Please note:  Crissie Figuresichard L Hillier was evaluated in Emergency Department on 04/14/2021 for the symptoms described in the history of present illness. He was evaluated in the context of the global COVID-19 pandemic, which necessitated consideration that the patient might be at risk for infection with the SARS-CoV-2 virus that causes COVID-19. Institutional protocols and algorithms that pertain to the evaluation of patients at risk for COVID-19 are in a state of rapid change based on information released by regulatory bodies including the CDC and federal and state organizations. These policies and algorithms were  followed during the patient's care in the ED.  Some ED evaluations and interventions may be delayed as a result of limited staffing during and the pandemic.*   Note:  This document was prepared using Dragon voice recognition software and may include unintentional dictation errors.    Nayelly Laughman, Layla Maw, DO 04/14/21 [phone]

## 2021-04-14 NOTE — ED Triage Notes (Signed)
Pt states feeling "hot and cold at same time" since tonight.  States checked BP at home 170/110.  Pt denies pain or n/v/d, states some SOB with productive cough.  Pt A&Ox4, chest rise even and unlabored, in NAD at this time.

## 2021-10-11 IMAGING — CR DG CHEST 2V
2 series · 2 of 2 positions shown · non-contrast
Comparison: 07/13/2020

CLINICAL DATA: Shortness of breath, cough

EXAM:
CHEST - 2 VIEW

[chest ap]
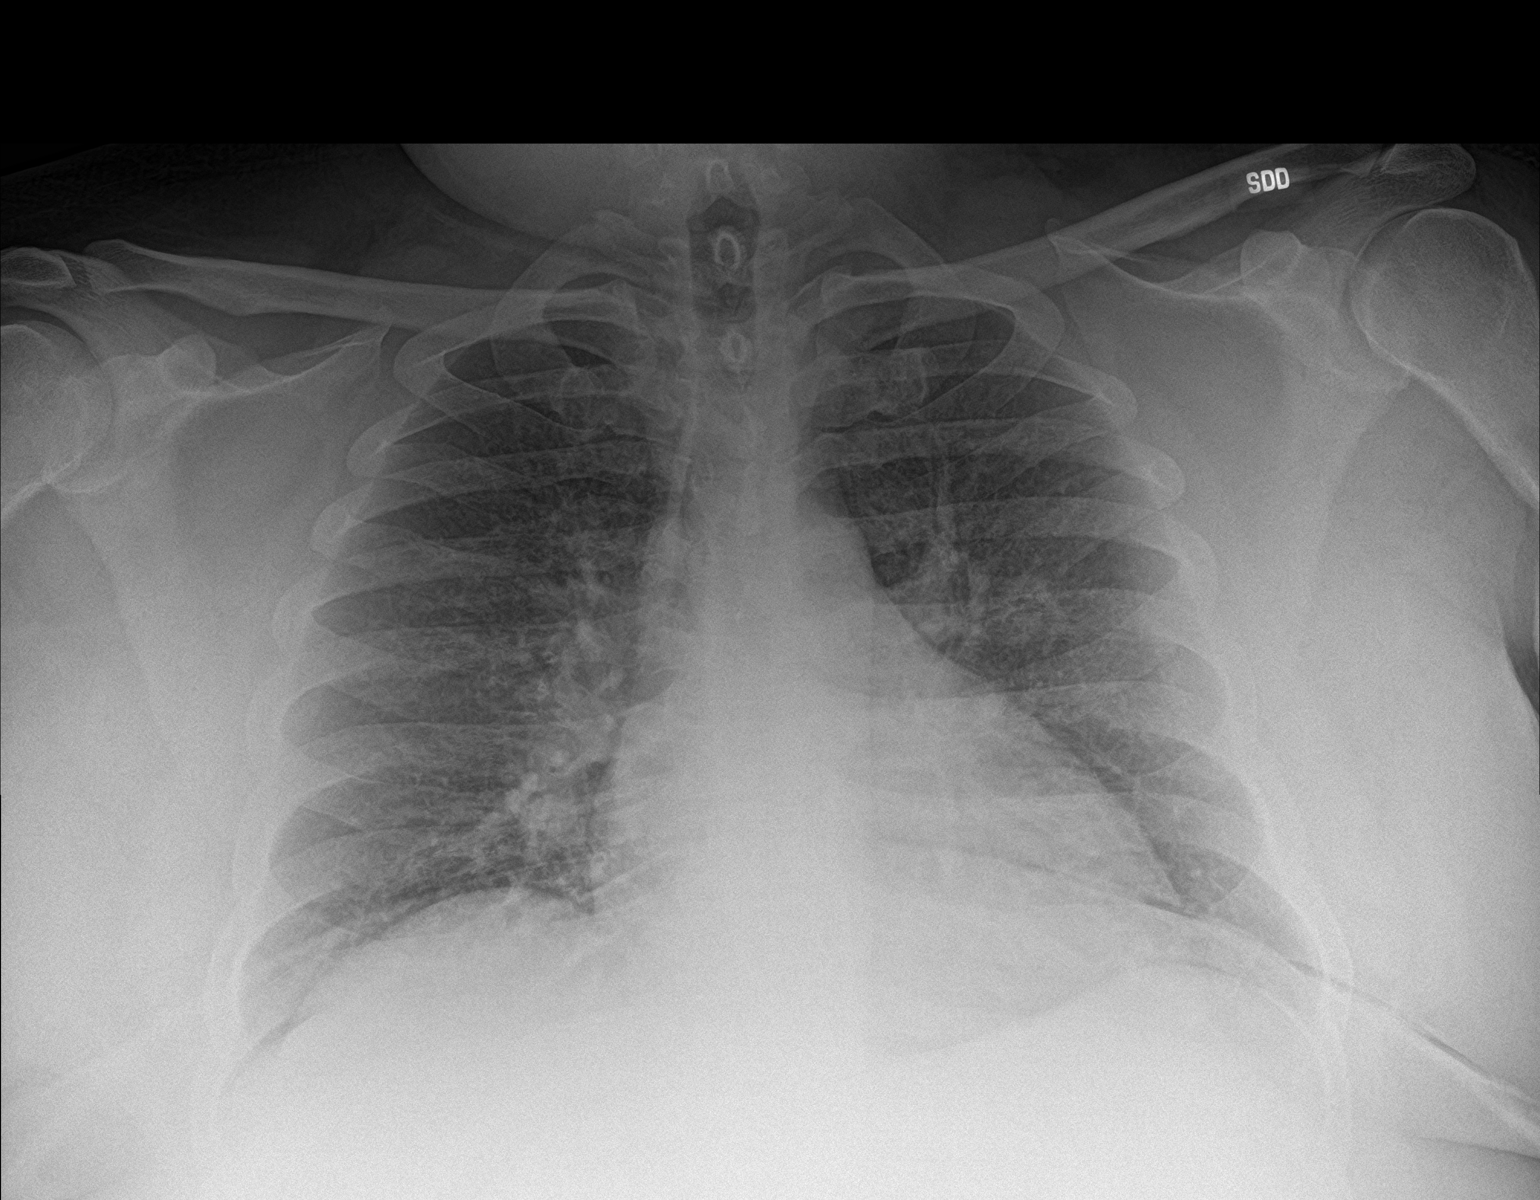

[chest lat]
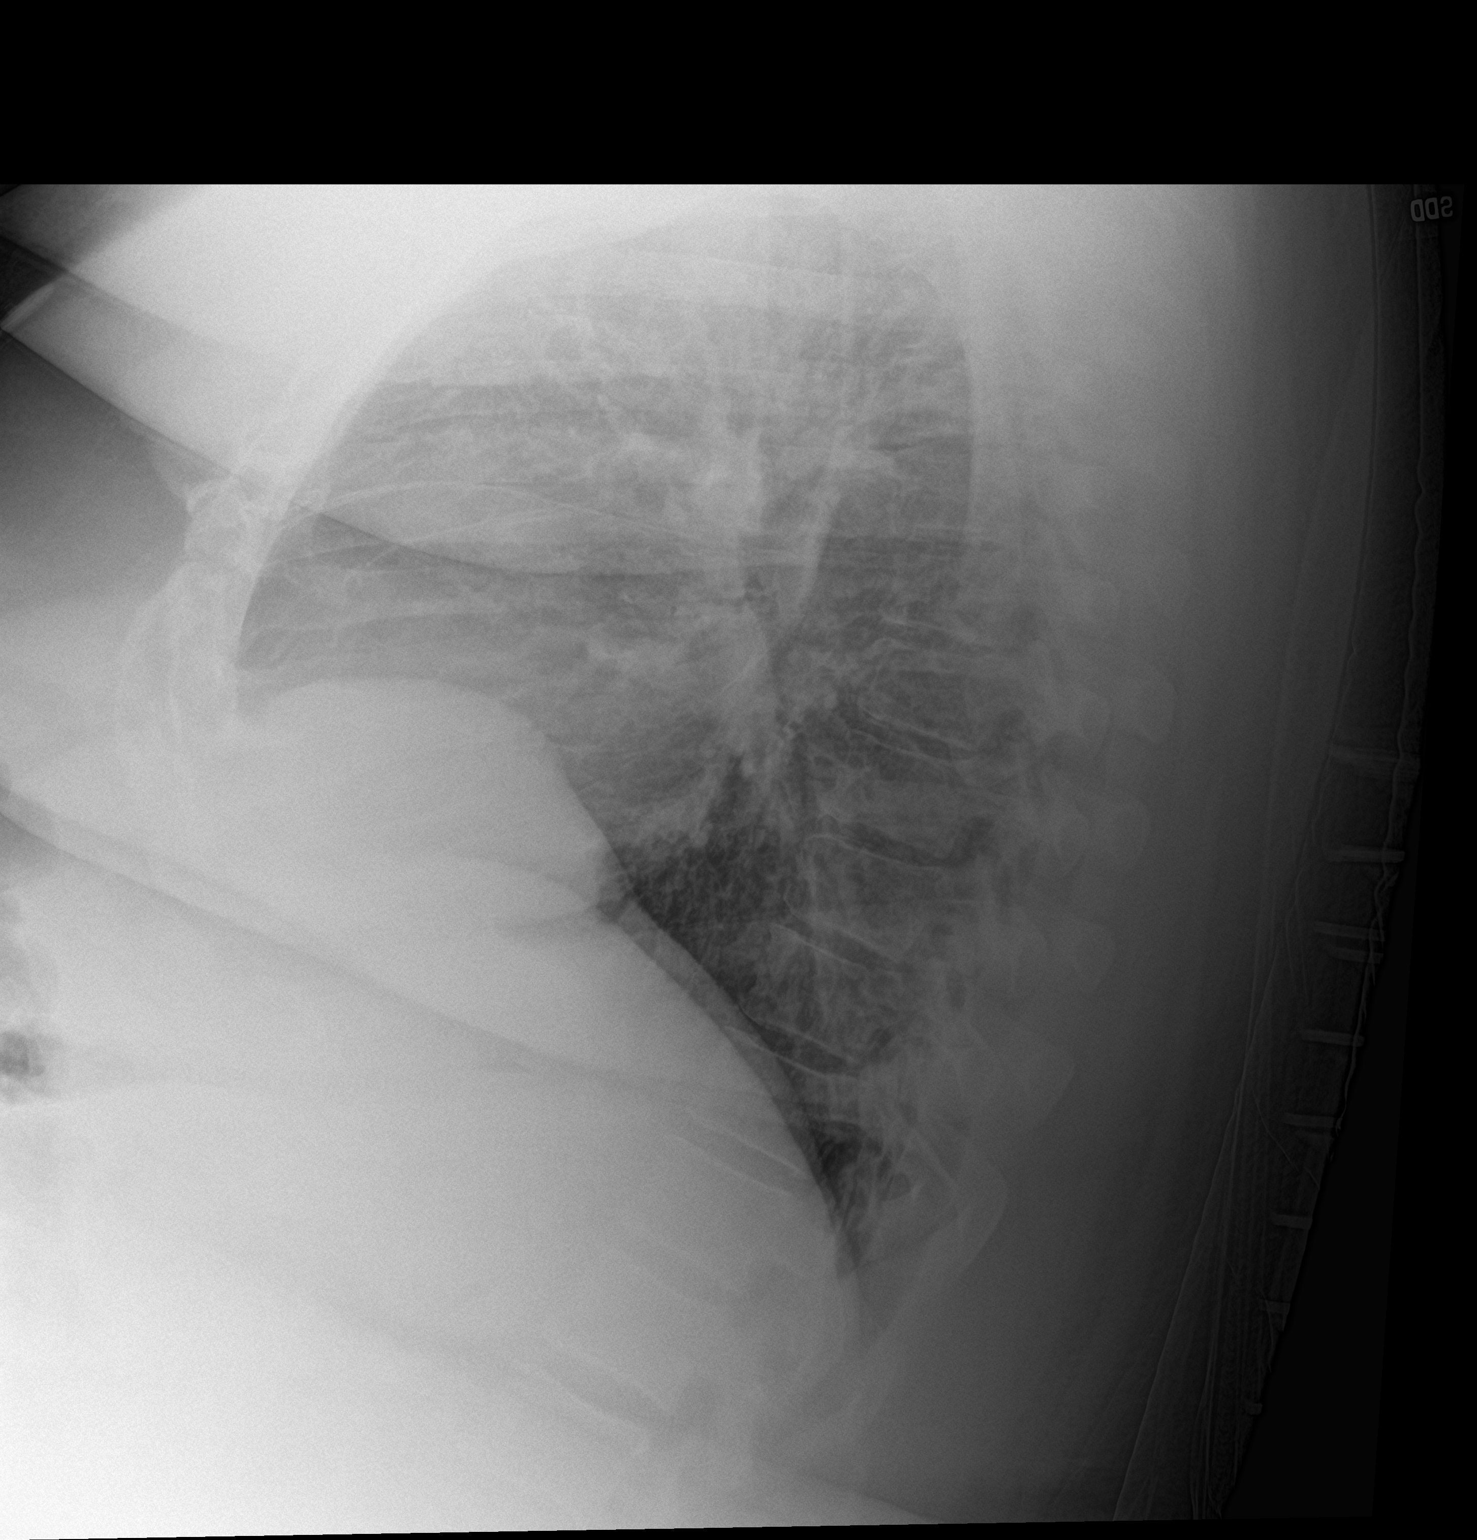

[2 of 2 positions shown; findings below may reference images not displayed]

FINDINGS: Peribronchial thickening. Heart and mediastinal contours are within
normal limits. No focal opacities or effusions. No acute bony
abnormality.
IMPRESSION: Bronchitic changes.

## 2021-10-13 IMAGING — CR DG CHEST 2V
1 series · 2 of 2 positions shown · non-contrast
Comparison: 01/22/2021

CLINICAL DATA: Shortness of breath. Recently diagnosed with
bilateral pneumonia. Possible sepsis.

EXAM:
CHEST - 2 VIEW

[Series 1: dg chest 2 view · 0.14mm/px · 2 of 2 slices shown]
[im 1/2]
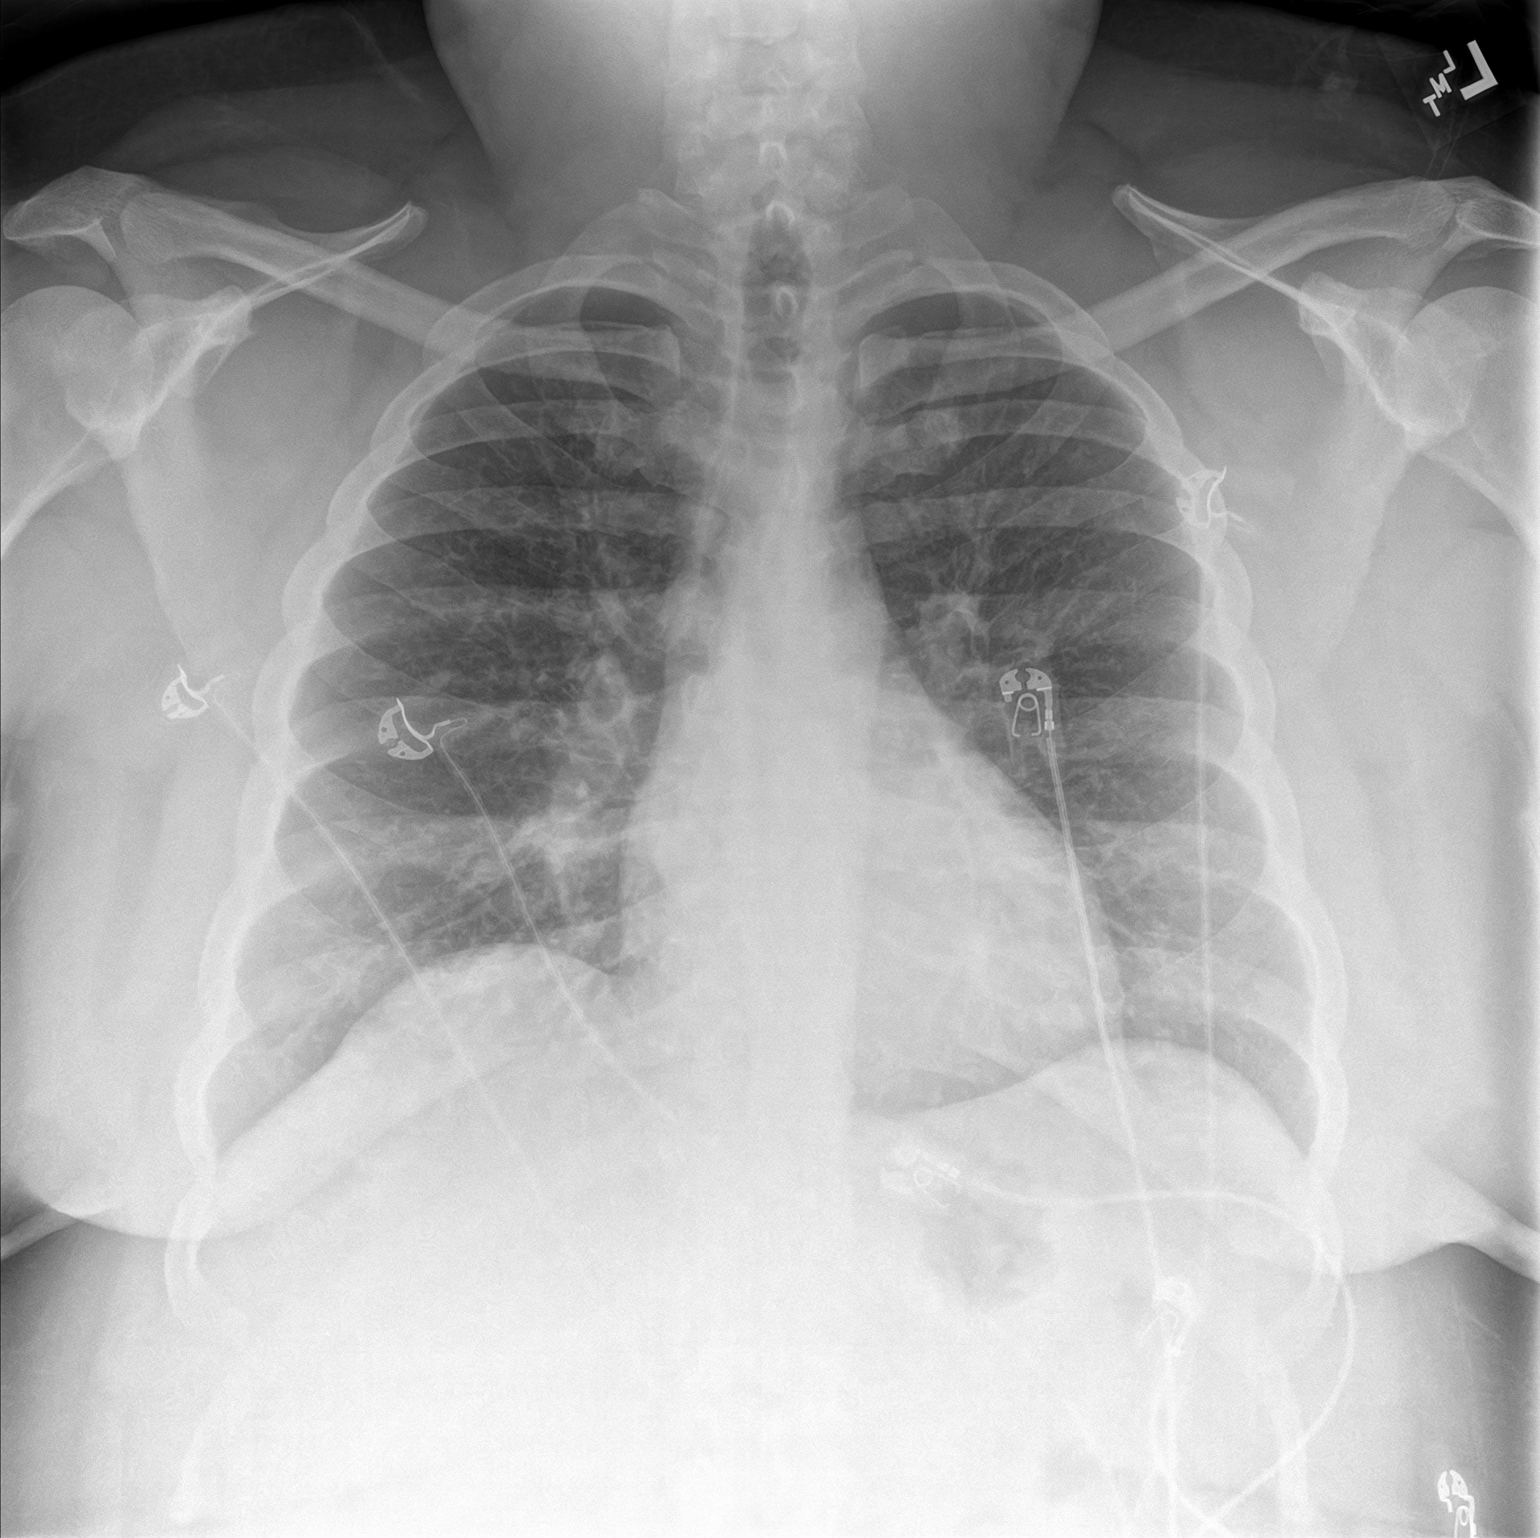
[im 2/2]
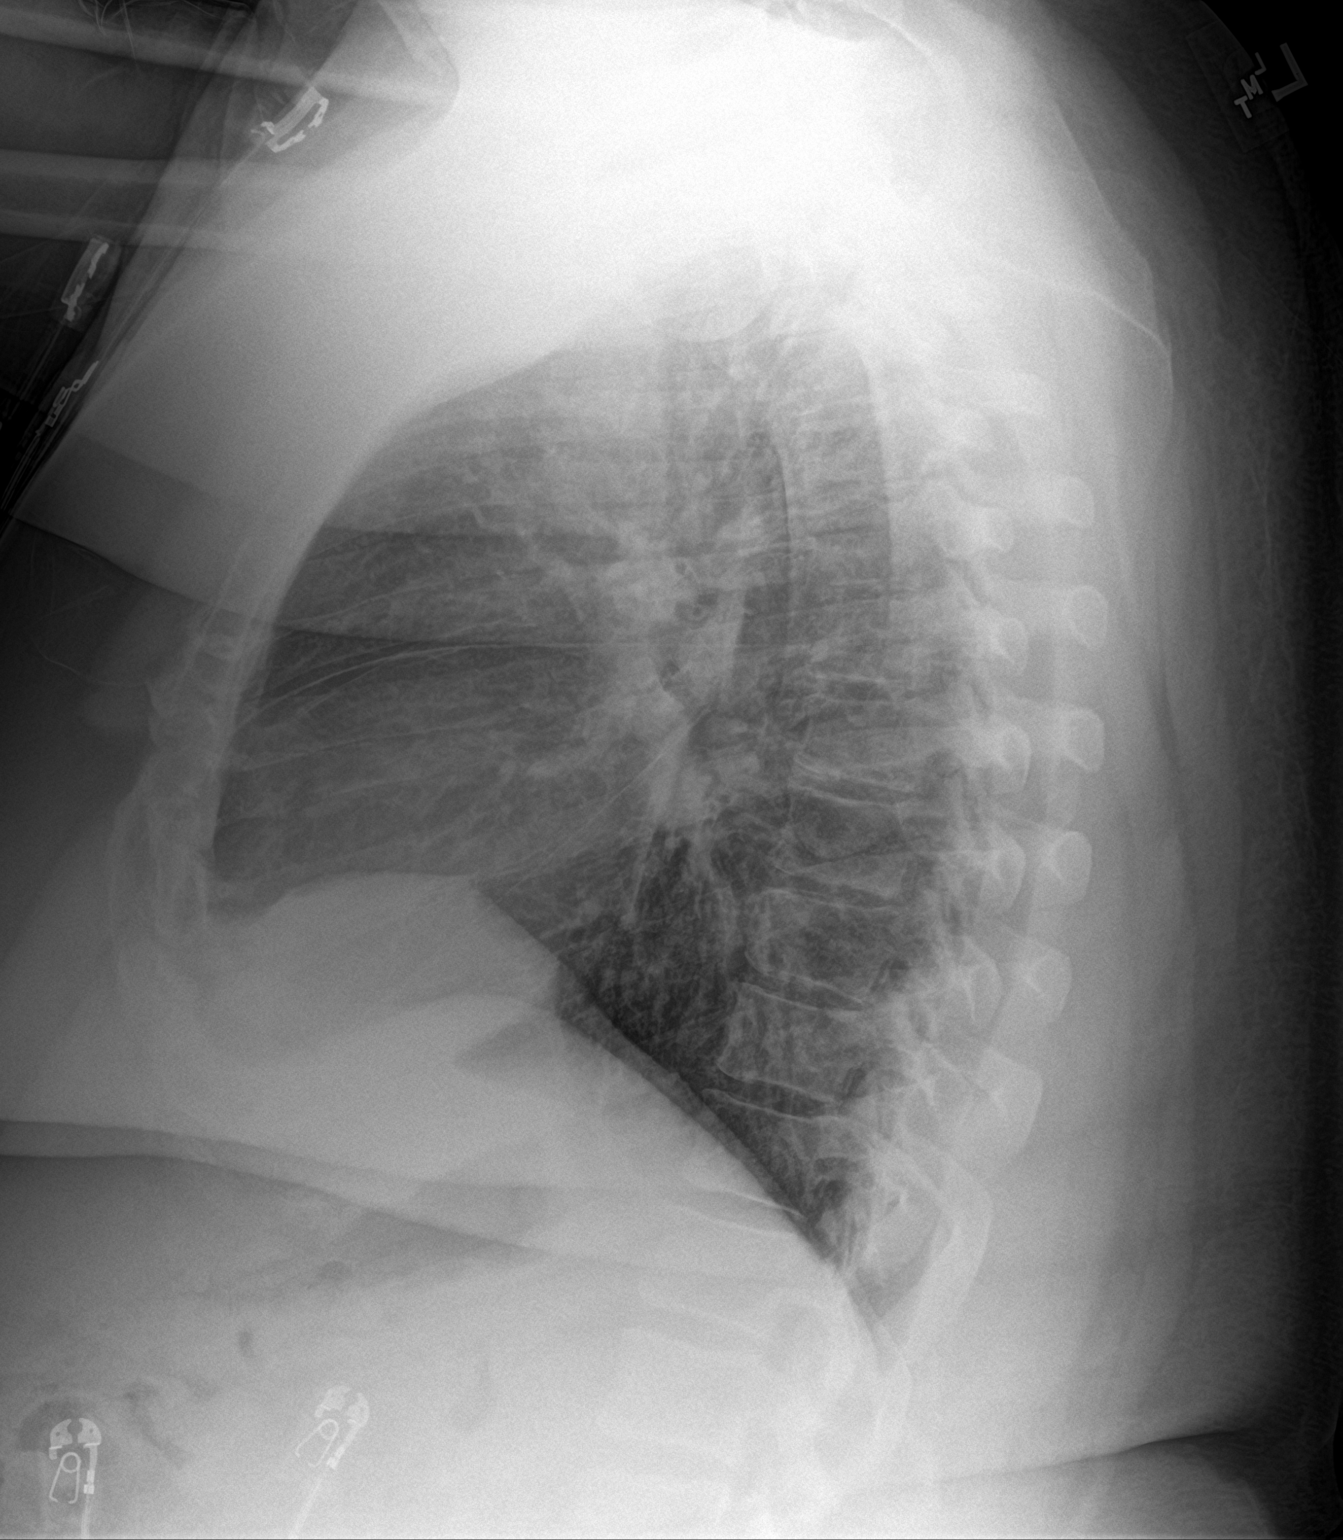

[2 of 2 positions shown; findings below may reference images not displayed]

FINDINGS: Normal sized heart. Clear lungs. Unremarkable bones.
IMPRESSION: Normal examination.

## 2021-12-13 ENCOUNTER — Other Ambulatory Visit: Payer: Self-pay

## 2021-12-13 ENCOUNTER — Emergency Department: Payer: Self-pay

## 2021-12-13 ENCOUNTER — Emergency Department
Admission: EM | Admit: 2021-12-13 | Discharge: 2021-12-13 | Disposition: A | Discharge status: Home / Self Care | Payer: Self-pay | Attending: Emergency Medicine | Admitting: Emergency Medicine

## 2021-12-13 ENCOUNTER — Encounter: Payer: Self-pay | Admitting: Emergency Medicine

## 2021-12-13 DIAGNOSIS — Z5321 Procedure and treatment not carried out due to patient leaving prior to being seen by health care provider: Secondary | ICD-10-CM | POA: Insufficient documentation

## 2021-12-13 DIAGNOSIS — R42 Dizziness and giddiness: Secondary | ICD-10-CM | POA: Insufficient documentation

## 2021-12-13 DIAGNOSIS — I10 Essential (primary) hypertension: Secondary | ICD-10-CM | POA: Insufficient documentation

## 2021-12-13 LAB — CBC
HCT: 46.4 % (ref 39.0–52.0)
Hemoglobin: 15.2 g/dL (ref 13.0–17.0)
MCH: 32.1 pg (ref 26.0–34.0)
MCHC: 32.8 g/dL (ref 30.0–36.0)
MCV: 97.9 fL (ref 80.0–100.0)
Platelets: 307 10*3/uL (ref 150–400)
RBC: 4.74 MIL/uL (ref 4.22–5.81)
RDW: 13.5 % (ref 11.5–15.5)
WBC: 10.8 10*3/uL — ABNORMAL HIGH (ref 4.0–10.5)
nRBC: 0 % (ref 0.0–0.2)

## 2021-12-13 LAB — BASIC METABOLIC PANEL
Anion gap: 7 (ref 5–15)
BUN: 14 mg/dL (ref 6–20)
CO2: 25 mmol/L (ref 22–32)
Calcium: 8.6 mg/dL — ABNORMAL LOW (ref 8.9–10.3)
Chloride: 107 mmol/L (ref 98–111)
Creatinine, Ser: 1.08 mg/dL (ref 0.61–1.24)
GFR, Estimated: >60 mL/min (ref >60)
Glucose, Bld: 132 mg/dL — ABNORMAL HIGH (ref 70–99)
Potassium: 4.2 mmol/L (ref 3.5–5.1)
Sodium: 139 mmol/L (ref 135–145)

## 2021-12-13 LAB — TROPONIN I (HIGH SENSITIVITY): Troponin I (High Sensitivity): 5 ng/L (ref <18)

## 2021-12-13 NOTE — ED Notes (Signed)
No answer when called several times from lobby 

## 2021-12-13 NOTE — ED Triage Notes (Signed)
Pt to ED from home c/o dizziness and HTN that started around 2100.  Denies hx of HTN and takes no medications for it. Denies pain or SOB.  States has BP machine at home and was 173/114.  Pt A&Ox4, chest rise even and unlabored, skin WNL and in NAD at this time. ?

## 2022-01-30 ENCOUNTER — Emergency Department: Payer: Self-pay

## 2022-01-30 ENCOUNTER — Emergency Department
Admission: EM | Admit: 2022-01-30 | Discharge: 2022-01-30 | Disposition: A | Discharge status: Home / Self Care | Payer: Self-pay | Attending: Emergency Medicine | Admitting: Emergency Medicine

## 2022-01-30 ENCOUNTER — Encounter: Payer: Self-pay | Admitting: Emergency Medicine

## 2022-01-30 ENCOUNTER — Other Ambulatory Visit: Payer: Self-pay

## 2022-01-30 DIAGNOSIS — R200 Anesthesia of skin: Secondary | ICD-10-CM | POA: Insufficient documentation

## 2022-01-30 DIAGNOSIS — Y990 Civilian activity done for income or pay: Secondary | ICD-10-CM | POA: Insufficient documentation

## 2022-01-30 DIAGNOSIS — R0789 Other chest pain: Secondary | ICD-10-CM

## 2022-01-30 DIAGNOSIS — M79661 Pain in right lower leg: Secondary | ICD-10-CM | POA: Insufficient documentation

## 2022-01-30 DIAGNOSIS — D72829 Elevated white blood cell count, unspecified: Secondary | ICD-10-CM | POA: Insufficient documentation

## 2022-01-30 DIAGNOSIS — Z20822 Contact with and (suspected) exposure to covid-19: Secondary | ICD-10-CM | POA: Insufficient documentation

## 2022-01-30 DIAGNOSIS — J441 Chronic obstructive pulmonary disease with (acute) exacerbation: Secondary | ICD-10-CM

## 2022-01-30 LAB — CBC WITH DIFFERENTIAL/PLATELET
Abs Immature Granulocytes: 0.04 10*3/uL (ref 0.00–0.07)
Basophils Absolute: 0.1 10*3/uL (ref 0.0–0.1)
Basophils Relative: 0 %
Eosinophils Absolute: 0.1 10*3/uL (ref 0.0–0.5)
Eosinophils Relative: 1 %
HCT: 46 % (ref 39.0–52.0)
Hemoglobin: 15.1 g/dL (ref 13.0–17.0)
Immature Granulocytes: 0 %
Lymphocytes Relative: 29 %
Lymphs Abs: 3.6 10*3/uL (ref 0.7–4.0)
MCH: 32.1 pg (ref 26.0–34.0)
MCHC: 32.8 g/dL (ref 30.0–36.0)
MCV: 97.9 fL (ref 80.0–100.0)
Monocytes Absolute: 0.7 10*3/uL (ref 0.1–1.0)
Monocytes Relative: 5 %
Neutro Abs: 8.2 10*3/uL — ABNORMAL HIGH (ref 1.7–7.7)
Neutrophils Relative %: 65 %
Platelets: 322 10*3/uL (ref 150–400)
RBC: 4.7 MIL/uL (ref 4.22–5.81)
RDW: 13.4 % (ref 11.5–15.5)
WBC: 12.6 10*3/uL — ABNORMAL HIGH (ref 4.0–10.5)
nRBC: 0 % (ref 0.0–0.2)

## 2022-01-30 LAB — COMPREHENSIVE METABOLIC PANEL
ALT: 26 U/L (ref 0–44)
AST: 19 U/L (ref 15–41)
Albumin: 4.3 g/dL (ref 3.5–5.0)
Alkaline Phosphatase: 55 U/L (ref 38–126)
Anion gap: 8 (ref 5–15)
BUN: 15 mg/dL (ref 6–20)
CO2: 25 mmol/L (ref 22–32)
Calcium: 9.1 mg/dL (ref 8.9–10.3)
Chloride: 103 mmol/L (ref 98–111)
Creatinine, Ser: 1.16 mg/dL (ref 0.61–1.24)
GFR, Estimated: >60 mL/min (ref >60)
Glucose, Bld: 101 mg/dL — ABNORMAL HIGH (ref 70–99)
Potassium: 3.8 mmol/L (ref 3.5–5.1)
Sodium: 136 mmol/L (ref 135–145)
Total Bilirubin: 1 mg/dL (ref 0.3–1.2)
Total Protein: 7.9 g/dL (ref 6.5–8.1)

## 2022-01-30 LAB — BRAIN NATRIURETIC PEPTIDE: B Natriuretic Peptide: 7.3 pg/mL (ref 0.0–100.0)

## 2022-01-30 LAB — LIPASE, BLOOD: Lipase: 29 U/L (ref 11–51)

## 2022-01-30 LAB — TROPONIN I (HIGH SENSITIVITY)
Troponin I (High Sensitivity): 7 ng/L (ref <18)
Troponin I (High Sensitivity): 6 ng/L (ref <18)

## 2022-01-30 LAB — PROTIME-INR
INR: 1.1 (ref 0.8–1.2)
Prothrombin Time: 13.7 seconds (ref 11.4–15.2)

## 2022-01-30 LAB — RESP PANEL BY RT-PCR (FLU A&B, COVID) ARPGX2
Influenza A by PCR: NEGATIVE
Influenza B by PCR: NEGATIVE
SARS Coronavirus 2 by RT PCR: NEGATIVE

## 2022-01-30 MED ORDER — PREDNISONE 20 MG PO TABS: 40.0000 mg | ORAL_TABLET | Freq: Every day | ORAL | 0 refills | Status: AC

## 2022-01-30 MED ORDER — ALBUTEROL SULFATE HFA 108 (90 BASE) MCG/ACT IN AERS: 2.0000 | INHALATION_SPRAY | RESPIRATORY_TRACT | 0 refills | Status: DC | PRN

## 2022-01-30 MED ORDER — ONDANSETRON HCL 4 MG/2ML IJ SOLN
4.0000 mg | Freq: Once | INTRAMUSCULAR | Status: AC
  Administered 2022-01-30: 4 mg via INTRAVENOUS
  Filled 2022-01-30: qty 2

## 2022-01-30 MED ORDER — MORPHINE SULFATE (PF) 4 MG/ML IV SOLN
4.0000 mg | Freq: Once | INTRAVENOUS | Status: AC
  Administered 2022-01-30: 4 mg via INTRAVENOUS
  Filled 2022-01-30: qty 1

## 2022-01-30 MED ORDER — RIVAROXABAN (XARELTO) VTE STARTER PACK (15 & 20 MG): ORAL_TABLET | ORAL | 0 refills | Status: DC

## 2022-01-30 MED ORDER — DOXYCYCLINE HYCLATE 100 MG PO CAPS: 100.0000 mg | ORAL_CAPSULE | Freq: Two times a day (BID) | ORAL | 0 refills | Status: DC

## 2022-01-30 MED ORDER — IPRATROPIUM-ALBUTEROL 0.5-2.5 (3) MG/3ML IN SOLN
3.0000 mL | Freq: Once | RESPIRATORY_TRACT | Status: AC
  Administered 2022-01-30: 3 mL via RESPIRATORY_TRACT
  Filled 2022-01-30: qty 3

## 2022-01-30 MED ORDER — METHYLPREDNISOLONE SODIUM SUCC 125 MG IJ SOLR
125.0000 mg | Freq: Once | INTRAMUSCULAR | Status: AC
  Administered 2022-01-30: 125 mg via INTRAVENOUS
  Filled 2022-01-30: qty 2

## 2022-01-30 MED ORDER — IOHEXOL 350 MG/ML SOLN
75.0000 mL | Freq: Once | INTRAVENOUS | Status: AC | PRN
  Administered 2022-01-30: 75 mL via INTRAVENOUS

## 2022-01-30 NOTE — ED Triage Notes (Signed)
?  Patient comes in with SOB and leg pain that has been going on for a few weeks.  Patient was diagnosed with DVT in lungs and was prescribed blood thinner but did not take it due to financial reasons.  Patient states both legs get numb intermittently when ambulating.  Patient states it hurts to take a deep breath.  Pain 8/10, sharp chest pain on inspiration.  ?

## 2022-01-30 NOTE — ED Provider Notes (Signed)
Procedures ? ?Clinical Course as of 01/30/22 0752  ?Thu Jan 30, 2022  ?0542 Troponin I (High Sensitivity): 7 ?High-sensitivity troponin is within normal limits [CF]  ?[place] coagulation studies [CF]  ?0542 CBC with Differential(!) ?CBC is notable for only mild leukocytosis of 12.6 [CF]  ?0542 Lipase, blood ?Lipase is within normal limits [CF]  ?0542 Comprehensive metabolic panel(!) ?CMP is within normal limits including no electrolyte abnormalities nor creatinine elevation.  Proceeding with CTA chest. [CF]  ?0617 Resp Panel by RT-PCR (Flu A&B, Covid) Nasopharyngeal Swab ?Negative respiratory viral panel [CF]  ?0725 US Venous Img Lower Bilateral ?I personally reviewed the patient's U/S (bilateral lower extremity) and I see no evidence of DVT.  Radiologist report also does not identify acute abnormality. [CF]  ?  ?Clinical Course User Index ?[CF] Hinda Kehr, MD  ? ? ?----------------------------------------- ?7:52 AM on 01/30/2022 ?----------------------------------------- ?Work-up is entirely unremarkable.  Serial troponins and BNP are normal.  Ultrasound bilateral lower extremity negative for DVT.  CT angiogram of the chest negative for PE or acute pulmonary findings. ? ?Patient reassessed after nebs.  He still has slight expiratory wheezing but normal expiratory phase.  He reports continued mild central chest tightness which is aggravated and reproduced by palpation of the anterior chest at the sternal margins. ? ?At this point I think his symptoms are due to chest wall strain/costochondritis and COPD exacerbation which is mild.  Oxygenation is 99% on room air and is not tachypneic or otherwise in distress.  He is not requiring admission with the entirely reassuring work-up and exam findings confirming a benign cause to his symptoms. Considering the patient's symptoms, medical history, and physical examination today, I have low suspicion for ACS, PE, TAD, pneumothorax, carditis, mediastinitis,  pneumonia, CHF, or sepsis. ? ?Prescription sent to pharmacy for albuterol prednisone doxycycline as well as Xarelto starter pack.  Counseled patient on the imperative for smoking cessation as well as taking his anticoagulation due to his multiple episodes of DVT/PE in the past and the risk of serious disability or death if he were to have a massive PE or large occlusive thrombus in the lower extremity.. ? ?  ?Carrie Mew, MD ?01/30/22 [phone] ? ?

## 2022-01-30 NOTE — ED Provider Notes (Signed)
? ?[facility] ?Provider Note ? ? ? Event Date/Time  ? First MD Initiated Contact with Patient 01/30/22 0440   ?  (approximate) ? ? ?History  ? ?Shortness of Breath and Leg Pain ? ? ?HPI ? ?Dalton Pena is a 41 y.o. male whose prior medical history includes morbid obesity, prior and recurrent pulmonary embolism, prior DVT, prior episodes of acute respiratory failure with hypoxia, and COPD even though the patient denies being aware of a prior diagnosis of COPD. ? ?He presents tonight by EMS for acute onset shortness of breath and chest pain.  The symptoms have been intermittent for a couple of weeks but became much worse tonight while he was at work and gradually got worse to the point he was sent home.  He reports that he feels like he cannot get enough air and is having some pain in the left upper part of his chest.  Sometimes he said that he gets numb in the legs when he is having his chest pain but his legs do not hurt worse than usual and they are not swollen.  He has not had any weakness in his extremities.  He reports the pain is present right now. ? ?Of note, he reports that he is supposed to be taking anticoagulation since he has had multiple blood clots in the past, but he cannot afford it so he is not taking anything and has not taken anything for months. ? ? ? ?Physical Exam  ? ?Triage Vital Signs: ?ED Triage Vitals [01/30/22 0446]  ?Enc Vitals Group  ?   BP (!) 160/98  ?   Pulse Rate 76  ?   Resp 18  ?   Temp 98 ?F (36.7 ?C)  ?   Temp Source Oral  ?   SpO2 99 %  ?   Weight 136.1 kg (300 lb)  ?   Height 1.651 m (5\' 5" )  ?   Head Circumference   ?   Peak Flow   ?   Pain Score 8  ?   Pain Loc   ?   Pain Edu?   ?   Excl. in GC?   ? ? ?Most recent vital signs: ?Vitals:  ? 01/30/22 0530 01/30/22 0640  ?BP: (!) 142/80 104/69  ?Pulse: 72 72  ?Resp: 17 14  ?Temp:    ?SpO2: 96% 95%  ? ? ? ?General: Awake, no respiratory distress at the moment but appears uncomfortable. ?CV:  Good  peripheral perfusion.  Normal cardiac sounds. ?Resp:  slightly increased respiratory effort with some mild tachypnea.  Lungs are mostly clear to auscultation with only very mild and expiratory wheezing, not consistent with COPD exacerbation. ?Abd:  Obese.  No tenderness to palpation. ?Other:  No clinically significant peripheral edema. ? ? ?ED Results / Procedures / Treatments  ? ?Labs ?(all labs ordered are listed, but only abnormal results are displayed) ?Labs Reviewed  ?COMPREHENSIVE METABOLIC PANEL - Abnormal; Notable for the following components:  ?    Result Value  ? Glucose, Bld 101 (*)   ? All other components within normal limits  ?CBC WITH DIFFERENTIAL/PLATELET - Abnormal; Notable for the following components:  ? WBC 12.6 (*)   ? Neutro Abs 8.2 (*)   ? All other components within normal limits  ?RESP PANEL BY RT-PCR (FLU A&B, COVID) ARPGX2  ?BRAIN NATRIURETIC PEPTIDE  ?LIPASE, BLOOD  ?PROTIME-INR  ?TROPONIN I (HIGH SENSITIVITY)  ?TROPONIN I (HIGH SENSITIVITY)  ? ? ? ?EKG ? ?  ED ECG REPORT ?ILoleta Rose, the attending physician, personally viewed and interpreted this ECG. ? ?Date: 01/30/2022 ?EKG Time: 4:48 AM ?Rate: 76 ?Rhythm: normal sinus rhythm ?QRS Axis: normal ?Intervals: normal ?ST/T Wave abnormalities: Non-specific ST segment / T-wave changes, but no clear evidence of acute ischemia. ?Narrative Interpretation: no definitive evidence of acute ischemia; does not meet STEMI criteria. ? ? ? ?RADIOLOGY ?No indication of pulmonary embolism on CTA chest.  No indication of DVT on bilateral lower extremity ultrasounds.  Please refer to documentation below for additional details. ? ? ? ?PROCEDURES: ? ?Critical Care performed: No ? ?.1-3 Lead EKG Interpretation ?Performed by: Loleta Rose, MD ?Authorized by: Loleta Rose, MD  ? ?  Interpretation: normal   ?  ECG rate:  70 ?  ECG rate assessment: normal   ?  Rhythm: sinus rhythm   ?  Ectopy: none   ?  Conduction: normal   ? ? ?MEDICATIONS ORDERED IN  ED: ?Medications  ?morphine (PF) 4 MG/ML injection 4 mg (4 mg Intravenous Given 01/30/22 0508)  ?ondansetron [facility]) injection 4 mg (4 mg Intravenous Given 01/30/22 0508)  ?iohexol (OMNIPAQUE) 350 MG/ML injection 75 mL (75 mLs Intravenous Contrast Given 01/30/22 0602)  ?ipratropium-albuterol (DUONEB) 0.5-2.5 (3) MG/3ML nebulizer solution 3 mL (3 mLs Nebulization Given 01/30/22 4627)  ?methylPREDNISolone sodium succinate (SOLU-MEDROL) 125 mg/2 mL injection 125 mg (125 mg Intravenous Given 01/30/22 0640)  ? ? ? ?IMPRESSION / MDM / ASSESSMENT AND PLAN / ED COURSE  ?I reviewed the triage vital signs and the nursing notes. ?             ?               ? ?Differential diagnosis includes, but is not limited to, pulmonary embolism, COPD exacerbation, ACS, pneumonia. ? ?Patient has had multiple thromboembolic events in the past and is not on anticoagulation.  His vital signs are reassuring at the moment but his symptoms are certainly suggestive of recurrent pulmonary embolus.  I am providing morphine 4 mg IV and Zofran 4 mg IV.  He is hemodynamically stable currently and not requiring supplemental oxygen. ? ?Given his high risk he is not an appropriate candidate for the use of a D-dimer.  I ordered CBC with differential, BNP, CMP, lipase, high-sensitivity troponin, respiratory viral panel, and pro time-INR.  Once the comprehensive metabolic panel comes back with normal renal function, I will proceed with CTA chest. ? ?The patient is on the cardiac monitor to evaluate for evidence of arrhythmia and/or significant heart rate changes. ? ?Clinical Course as of 01/30/22 0842  ?Thu Jan 30, 2022  ?0542 Troponin I (High Sensitivity): 7 ?High-sensitivity troponin is within normal limits [CF]  ?0350 Protime-INR ?Normal coagulation studies [CF]  ?0542 CBC with Differential(!) ?CBC is notable for only mild leukocytosis of 12.6 [CF]  ?0542 Lipase, blood ?Lipase is within normal limits [CF]  ?0938 Comprehensive metabolic panel(!) ?CMP is  within normal limits including no electrolyte abnormalities nor creatinine elevation.  Proceeding with CTA chest. [CF]  ?0617 Resp Panel by RT-PCR (Flu A&B, Covid) Nasopharyngeal Swab ?Negative respiratory viral panel [CF]  ?0725 US Venous Img Lower Bilateral ?I personally reviewed the patient's U/S (bilateral lower extremity) and I see no evidence of DVT.  Radiologist report also does not identify acute abnormality. [CF]  ?1829 Transferred ED care to Dr. Scotty Court to reassess and determine disposition.  Given reassuring workup, anticipate that discharge home may be appropriate. [CF]  ?  ?Clinical Course User Index ?[  CF] Loleta Rose, MD  ? ? ? ?FINAL CLINICAL IMPRESSION(S) / ED DIAGNOSES  ? ?Final diagnoses:  ?COPD exacerbation (HCC)  ?Chest wall pain  ? ? ? ?Rx / DC Orders  ? ?ED Discharge Orders   ? ?      Ordered  ?  RIVAROXABAN (XARELTO) VTE STARTER PACK (15 & 20 MG)       ? 01/30/22 0750  ?  albuterol (PROVENTIL HFA) 108 (90 Base) MCG/ACT inhaler  Every 4 hours PRN       ? 01/30/22 0750  ?  predniSONE (DELTASONE) 20 MG tablet  Daily with breakfast       ? 01/30/22 0750  ?  doxycycline (VIBRAMYCIN) 100 MG capsule  2 times daily       ? 01/30/22 0750  ? ?  ?  ? ?  ? ? ? ?Note:  This document was prepared using Dragon voice recognition software and may include unintentional dictation errors. ?  ?Loleta Rose, MD ?01/30/22 [phone] ? ?

## 2022-01-30 NOTE — Discharge Instructions (Signed)
Your lab tests, CT scan of the chest, and Ultrasound of the legs were all okay today. Your symptoms appear to be due to an exacerbation of lung disease.  You should stop smoking tobacco and marijuana to avoid further lung problems. ?

## 2022-03-20 ENCOUNTER — Other Ambulatory Visit: Payer: Self-pay

## 2022-03-20 ENCOUNTER — Emergency Department
Admission: EM | Admit: 2022-03-20 | Discharge: 2022-03-20 | Disposition: A | Discharge status: Home / Self Care | Payer: Self-pay | Attending: Emergency Medicine | Admitting: Emergency Medicine

## 2022-03-20 DIAGNOSIS — J449 Chronic obstructive pulmonary disease, unspecified: Secondary | ICD-10-CM | POA: Insufficient documentation

## 2022-03-20 DIAGNOSIS — Z7901 Long term (current) use of anticoagulants: Secondary | ICD-10-CM | POA: Insufficient documentation

## 2022-03-20 DIAGNOSIS — M5412 Radiculopathy, cervical region: Secondary | ICD-10-CM | POA: Insufficient documentation

## 2022-03-20 DIAGNOSIS — M5410 Radiculopathy, site unspecified: Secondary | ICD-10-CM

## 2022-03-20 LAB — BASIC METABOLIC PANEL
Anion gap: 6 (ref 5–15)
BUN: 14 mg/dL (ref 6–20)
CO2: 25 mmol/L (ref 22–32)
Calcium: 9.1 mg/dL (ref 8.9–10.3)
Chloride: 109 mmol/L (ref 98–111)
Creatinine, Ser: 1.07 mg/dL (ref 0.61–1.24)
GFR, Estimated: >60 mL/min (ref >60)
Glucose, Bld: 130 mg/dL — ABNORMAL HIGH (ref 70–99)
Potassium: 4.1 mmol/L (ref 3.5–5.1)
Sodium: 140 mmol/L (ref 135–145)

## 2022-03-20 LAB — CBC
HCT: 47.8 % (ref 39.0–52.0)
Hemoglobin: 15.6 g/dL (ref 13.0–17.0)
MCH: 32.2 pg (ref 26.0–34.0)
MCHC: 32.6 g/dL (ref 30.0–36.0)
MCV: 98.6 fL (ref 80.0–100.0)
Platelets: 298 10*3/uL (ref 150–400)
RBC: 4.85 MIL/uL (ref 4.22–5.81)
RDW: 13.2 % (ref 11.5–15.5)
WBC: 12.1 10*3/uL — ABNORMAL HIGH (ref 4.0–10.5)
nRBC: 0 % (ref 0.0–0.2)

## 2022-03-20 NOTE — ED Notes (Signed)
Pt was on a blood thinner but has not taken it in a while due to financial issues.

## 2022-03-20 NOTE — ED Provider Notes (Addendum)
[facility] Provider Note    Event Date/Time   First MD Initiated Contact with Patient 03/20/22 1120     (approximate)   History   Chief Complaint Numbness   HPI  Dalton Pena is a 41 y.o. 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  Patient reports that he has been dealing with greater than 1 month of numbness going down the back of his left leg.  This has been associated with pain starting in the middle of his lower back and radiating down the left leg as well.  He denies any weakness in his leg or numbness in his groin, has not had any difficulty with urinary retention or incontinence.  He states the pain is worse when he goes to walk, but he denies any specific trauma.  He became more concerned today when he began having pain and numbness radiating down his right arm.  He denies any pain in his neck and has not had any recent injuries to his neck or arm.  He denies any vision changes, speech changes, or symptoms affecting his left arm or right leg.     Physical Exam   Triage Vital Signs: ED Triage Vitals  Enc Vitals Group     BP 03/20/22 1020 (!) 145/58     Pulse Rate 03/20/22 1020 73     Resp 03/20/22 1020 18     Temp 03/20/22 1020 98.4 F (36.9 C)     Temp Source 03/20/22 1020 Oral     SpO2 03/20/22 1020 95 %     Weight 03/20/22 1021 270 lb (122.5 kg)     Height 03/20/22 1021 5\' 5"  (1.651 m)     Head Circumference --      Peak Flow --      Pain Score 03/20/22 1020 8     Pain Loc --      Pain Edu? --      Excl. in [place]? --     Most recent vital signs: Vitals:   03/20/22 1020  BP: (!) 145/58  Pulse: 73  Resp: 18  Temp: 98.4 F (36.9 C)  SpO2: 95%    Constitutional: Alert and oriented. Eyes: Conjunctivae are normal. Head: Atraumatic. Nose: No congestion/rhinnorhea. Mouth/Throat: Mucous membranes are moist.  Neck: No midline cervical spine tenderness to  palpation. Cardiovascular: Normal rate, regular rhythm. Grossly normal heart sounds.  2+ radial and DP pulses bilaterally. Respiratory: Normal respiratory effort.  No retractions. Lungs CTAB. Gastrointestinal: Soft and nontender. No distention. Musculoskeletal: No lower extremity tenderness nor edema.  Neurologic:  Normal speech and language. No gross focal neurologic deficits are appreciated.    ED Results / Procedures / Treatments   Labs (all labs ordered are listed, but only abnormal results are displayed) Labs Reviewed  BASIC METABOLIC PANEL - Abnormal; Notable for the following components:      Result Value   Glucose, Bld 130 (*)    All other components within normal limits  CBC - Abnormal; Notable for the following components:   WBC 12.1 (*)    All other components within normal limits  URINALYSIS, ROUTINE W REFLEX MICROSCOPIC  CBG MONITORING, ED     EKG  ED ECG REPORT I, 03/22/22, the attending physician, personally viewed and interpreted this ECG.   Date: 03/20/2022  EKG Time: 10:26  Rate: 68  Rhythm: normal sinus rhythm  Axis: Normal  Intervals:none  ST&T Change: None  PROCEDURES:  Critical Care performed: No  Procedures   MEDICATIONS ORDERED IN ED: Medications - No data to display   IMPRESSION / MDM / ASSESSMENT AND PLAN / ED COURSE  I reviewed the triage vital signs and the nursing notes.                              41 y.o. 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  Patient's presentation is most consistent with acute presentation with potential threat to life or bodily function.  Differential diagnosis includes, but is not limited to, stroke, TIA, electrolyte abnormality, peripheral neuropathy, cervical radiculopathy, lumbar radiculopathy,, cauda  equina.  Patient well-appearing and in no acute distress, vital signs are unremarkable and he has no focal neurologic deficits on exam.  He is neurovascular intact to all 4 extremities and symptoms do not appear consistent with stroke given they affect his left leg and his right arm.  Symptoms seem most consistent with chronic lumbar radiculopathy now with some cervical radiculopathy on the right.  No findings to suggest cauda equina syndrome or significant central stenosis in his cervical area.  Patient was offered symptomatic management but became upset that he was not being offered x-ray imaging.  I explained to patient that x-rays are not indicated but he became increasingly upset and demanded to leave.  He was counseled to follow-up with his PCP and to return to the ED for new or worsening symptoms.      FINAL CLINICAL IMPRESSION(S) / ED DIAGNOSES   Final diagnoses:  Radiculopathy, unspecified spinal region     Rx / DC Orders   ED Discharge Orders     None        Note:  This document was prepared using Dragon voice recognition software and may include unintentional dictation errors.   Chesley Noon, MD 03/20/22 1215    Chesley Noon, MD 03/20/22 1220

## 2022-03-20 NOTE — ED Notes (Signed)
Pt states symptoms started yesterday. 

## 2022-03-20 NOTE — ED Triage Notes (Signed)
Pt here with numbness in his left arm and right leg. Pt states his left arm started to become numb yesterday and his right leg has been numb for a while. Pt states he can still ambulate but it is getting worse. Pt states that he has an area in his lower back that is causing issues as well. Pt denies N/V/D.

## 2022-03-20 NOTE — ED Notes (Addendum)
RN attempted to go over D/C instructions with pt. RN started to explain pt diagnoses and pt sat up in the bed and sts " Radiculopathy, what type? That's fine, yall dont want to do your damn job anyway. Give me those papers and get out of my way." RN attempted to go over the d/c process to include vital signs but pt walked away.

## 2022-08-04 ENCOUNTER — Emergency Department: Payer: Self-pay

## 2022-08-04 ENCOUNTER — Emergency Department
Admission: EM | Admit: 2022-08-04 | Discharge: 2022-08-04 | Disposition: A | Discharge status: Home / Self Care | Payer: Self-pay | Attending: Emergency Medicine | Admitting: Emergency Medicine

## 2022-08-04 ENCOUNTER — Other Ambulatory Visit: Payer: Self-pay

## 2022-08-04 DIAGNOSIS — M7989 Other specified soft tissue disorders: Secondary | ICD-10-CM | POA: Insufficient documentation

## 2022-08-04 DIAGNOSIS — M79604 Pain in right leg: Secondary | ICD-10-CM | POA: Insufficient documentation

## 2022-08-04 MED ORDER — HYDROCODONE-ACETAMINOPHEN 5-325 MG PO TABS: 1.0000 | ORAL_TABLET | ORAL | 0 refills | Status: DC | PRN

## 2022-08-04 MED ORDER — OXYCODONE-ACETAMINOPHEN 5-325 MG PO TABS
1.0000 | ORAL_TABLET | Freq: Once | ORAL | Status: AC
  Administered 2022-08-04: 1 via ORAL
  Filled 2022-08-04: qty 1

## 2022-08-04 MED ORDER — MEDICAL COMPRESSION STOCKINGS MISC
0 refills | Status: DC
Start: 2022-08-04 — End: 2022-12-30

## 2022-08-04 NOTE — ED Notes (Signed)
See triage note  Presents with leg pain  denies any injury   but states he woke up with pain

## 2022-08-04 NOTE — ED Triage Notes (Signed)
Pt reports that he began having right lower leg pain and swelling x 1 day.Denies specific injury. Pt reports that pain is similar to when he had blood clot in same leg approx 2 years ago. Denies current anticoag therapy.

## 2022-08-04 NOTE — Discharge Instructions (Addendum)
Please follow-up with her primary care physician regarding your right leg pain, elevated blood pressure, anticoagulation/blood thinner needs, and ongoing primary care management.  Please wear your prescribed compression stockings.  You may take your prescribed pain medication as needed, but only as prescribed.  Do not drink alcohol or drive while taking this medication as it can be sedating.  Return to the emergency department for any worsening pain swelling chest pain or trouble breathing.

## 2022-08-04 NOTE — ED Provider Notes (Signed)
[facility] Provider Note    Event Date/Time   First MD Initiated Contact with Patient 08/04/22 0710     (approximate)  History   Chief Complaint: Leg Pain  HPI  Dalton Pena is a 41 y.o. male with a past medical history of prior DVT and PE who presents to the emergency department for right leg pain.  According to the patient since yesterday he has been experiencing pain and swelling to the right leg.  Patient states he is supposed to be on blood thinners but cannot afford them due to financial reasons.  States he has gone through medication management and they were unable to help.  Patient states overnight and yesterday his right leg began hurting worse so he came to the emergency department for evaluation.  States the pain feels similar to when he had a blood clot 2 years ago.  Physical Exam   Triage Vital Signs: ED Triage Vitals  Enc Vitals Group     BP 08/04/22 0154 136/89     Pulse Rate 08/04/22 0154 76     Resp 08/04/22 0154 20     Temp 08/04/22 0154 98.4 F (36.9 C)     Temp Source 08/04/22 0154 Oral     SpO2 08/04/22 0154 95 %     Weight 08/04/22 0154 240 lb (108.9 kg)     Height 08/04/22 0154 5\' 5"  (1.651 m)     Head Circumference --      Peak Flow --      Pain Score 08/04/22 0202 10     Pain Loc --      Pain Edu? --      Excl. in [place]? --     Most recent vital signs: Vitals:   08/04/22 0154 08/04/22 0711  BP: 136/89 (!) 164/123  Pulse: 76 72  Resp: 20 20  Temp: 98.4 F (36.9 C) 98.4 F (36.9 C)  SpO2: 95% 98%    General: Awake, no distress.  CV:  Good peripheral perfusion.  Regular rate and rhythm  Resp:  Normal effort.  Equal breath sounds bilaterally.  Abd:  No distention.  Soft, nontender.  No rebound or guarding. Other:  2+ DP pulse.   ED Results / Procedures / Treatments   RADIOLOGY  Ultrasound negative for DVT   MEDICATIONS ORDERED IN ED: Medications - No data to display   IMPRESSION / MDM / ASSESSMENT AND  PLAN / ED COURSE  I reviewed the triage vital signs and the nursing notes.  Patient's presentation is most consistent with acute presentation with potential threat to life or bodily function.  Patient presents emergency department for right lower leg pain starting yesterday and worsening overnight.  Patient has a history of DVT and PE previously supposed to be on lifelong anticoagulation but cannot afford it.  Patient denies any chest pain or shortness of breath.  Patient is hypertensive but otherwise reassuring vitals.  On my examination patient does have some mild swelling to the right leg compared to the left leg.  2+ DP pulse with sensation intact and equal.  No discoloration.  Ultrasound negative for DVT.  Given the patient's reassuring work-up highly suspect more musculoskeletal discomfort patient does have mild peripheral edema in the right leg compared to the left leg.  We will prescribe compression stockings a short course of pain medication have the patient follow-up with a PCP.  I will refer to a PCP as well.  Patient agreeable to plan of  care.  FINAL CLINICAL IMPRESSION(S) / ED DIAGNOSES   Right leg pain  Note:  This document was prepared using Dragon voice recognition software and may include unintentional dictation errors.   Minna Antis, MD 08/04/22 [phone]

## 2022-12-30 ENCOUNTER — Inpatient Hospital Stay: Payer: Medicaid Other

## 2022-12-30 ENCOUNTER — Emergency Department: Payer: Medicaid Other

## 2022-12-30 ENCOUNTER — Other Ambulatory Visit: Payer: Self-pay

## 2022-12-30 ENCOUNTER — Other Ambulatory Visit (HOSPITAL_COMMUNITY): Payer: Self-pay

## 2022-12-30 ENCOUNTER — Observation Stay
Admission: EM | Admit: 2022-12-30 | Discharge: 2022-12-30 | DRG: 299 | Discharge status: Against Medical Advice | Payer: Medicaid Other | Attending: Internal Medicine | Admitting: Internal Medicine

## 2022-12-30 DIAGNOSIS — R079 Chest pain, unspecified: Principal | ICD-10-CM

## 2022-12-30 DIAGNOSIS — Z720 Tobacco use: Secondary | ICD-10-CM

## 2022-12-30 DIAGNOSIS — Z8249 Family history of ischemic heart disease and other diseases of the circulatory system: Secondary | ICD-10-CM

## 2022-12-30 DIAGNOSIS — R001 Bradycardia, unspecified: Secondary | ICD-10-CM | POA: Diagnosis present

## 2022-12-30 DIAGNOSIS — Z86711 Personal history of pulmonary embolism: Secondary | ICD-10-CM

## 2022-12-30 DIAGNOSIS — Z86718 Personal history of other venous thrombosis and embolism: Secondary | ICD-10-CM | POA: Diagnosis not present

## 2022-12-30 DIAGNOSIS — I82461 Acute embolism and thrombosis of right calf muscular vein: Principal | ICD-10-CM

## 2022-12-30 DIAGNOSIS — Z7901 Long term (current) use of anticoagulants: Secondary | ICD-10-CM | POA: Diagnosis not present

## 2022-12-30 DIAGNOSIS — I82431 Acute embolism and thrombosis of right popliteal vein: Secondary | ICD-10-CM | POA: Diagnosis not present

## 2022-12-30 DIAGNOSIS — F17210 Nicotine dependence, cigarettes, uncomplicated: Secondary | ICD-10-CM | POA: Diagnosis not present

## 2022-12-30 DIAGNOSIS — J9601 Acute respiratory failure with hypoxia: Secondary | ICD-10-CM

## 2022-12-30 DIAGNOSIS — J449 Chronic obstructive pulmonary disease, unspecified: Secondary | ICD-10-CM | POA: Diagnosis not present

## 2022-12-30 DIAGNOSIS — Z716 Tobacco abuse counseling: Secondary | ICD-10-CM | POA: Diagnosis not present

## 2022-12-30 DIAGNOSIS — E669 Obesity, unspecified: Secondary | ICD-10-CM | POA: Diagnosis present

## 2022-12-30 LAB — CBC
HCT: 43.7 % (ref 39.0–52.0)
HCT: 44.9 % (ref 39.0–52.0)
Hemoglobin: 14.1 g/dL (ref 13.0–17.0)
Hemoglobin: 14.8 g/dL (ref 13.0–17.0)
MCH: 31.8 pg (ref 26.0–34.0)
MCH: 32.2 pg (ref 26.0–34.0)
MCHC: 32.3 g/dL (ref 30.0–36.0)
MCHC: 33 g/dL (ref 30.0–36.0)
MCV: 98.6 fL (ref 80.0–100.0)
MCV: 97.8 fL (ref 80.0–100.0)
Platelets: 261 10*3/uL (ref 150–400)
Platelets: 293 10*3/uL (ref 150–400)
RBC: 4.43 MIL/uL (ref 4.22–5.81)
RBC: 4.59 MIL/uL (ref 4.22–5.81)
RDW: 13.8 % (ref 11.5–15.5)
RDW: 13.8 % (ref 11.5–15.5)
WBC: 11.5 10*3/uL — ABNORMAL HIGH (ref 4.0–10.5)
WBC: 10.2 10*3/uL (ref 4.0–10.5)
nRBC: 0 % (ref 0.0–0.2)
nRBC: 0 % (ref 0.0–0.2)

## 2022-12-30 LAB — BASIC METABOLIC PANEL
Anion gap: 8 (ref 5–15)
Anion gap: 12 (ref 5–15)
BUN: 17 mg/dL (ref 6–20)
BUN: 12 mg/dL (ref 6–20)
CO2: 25 mmol/L (ref 22–32)
CO2: 24 mmol/L (ref 22–32)
Calcium: 8.4 mg/dL — ABNORMAL LOW (ref 8.9–10.3)
Calcium: 8.5 mg/dL — ABNORMAL LOW (ref 8.9–10.3)
Chloride: 106 mmol/L (ref 98–111)
Chloride: 103 mmol/L (ref 98–111)
Creatinine, Ser: 1.09 mg/dL (ref 0.61–1.24)
Creatinine, Ser: 0.9 mg/dL (ref 0.61–1.24)
GFR, Estimated: >60 mL/min (ref >60)
GFR, Estimated: >60 mL/min (ref >60)
Glucose, Bld: 127 mg/dL — ABNORMAL HIGH (ref 70–99)
Glucose, Bld: 147 mg/dL — ABNORMAL HIGH (ref 70–99)
Potassium: 3.8 mmol/L (ref 3.5–5.1)
Potassium: 4.2 mmol/L (ref 3.5–5.1)
Sodium: 139 mmol/L (ref 135–145)
Sodium: 139 mmol/L (ref 135–145)

## 2022-12-30 LAB — PROTIME-INR
INR: 1 (ref 0.8–1.2)
Prothrombin Time: 13.3 seconds (ref 11.4–15.2)

## 2022-12-30 LAB — TROPONIN I (HIGH SENSITIVITY)
Troponin I (High Sensitivity): 5 ng/L (ref <18)
Troponin I (High Sensitivity): 6 ng/L (ref <18)

## 2022-12-30 LAB — APTT: aPTT: 58 seconds — ABNORMAL HIGH (ref 24–36)

## 2022-12-30 LAB — HIV ANTIBODY (ROUTINE TESTING W REFLEX): HIV Screen 4th Generation wRfx: NONREACTIVE

## 2022-12-30 LAB — HEPARIN LEVEL (UNFRACTIONATED): Heparin Unfractionated: 0.4 IU/mL (ref 0.30–0.70)

## 2022-12-30 MED ORDER — TRAZODONE HCL 50 MG PO TABS: 25.0000 mg | ORAL_TABLET | Freq: Every evening | ORAL | Status: DC | PRN

## 2022-12-30 MED ORDER — ONDANSETRON HCL 4 MG PO TABS: 4.0000 mg | ORAL_TABLET | Freq: Four times a day (QID) | ORAL | Status: DC | PRN

## 2022-12-30 MED ORDER — WARFARIN SODIUM 10 MG PO TABS
10.0000 mg | ORAL_TABLET | Freq: Once | ORAL | Status: AC
  Administered 2022-12-30: 10 mg via ORAL
  Filled 2022-12-30: qty 2

## 2022-12-30 MED ORDER — WARFARIN - PHARMACIST DOSING INPATIENT
Freq: Every day | Status: DC
  Filled 2022-12-30: qty 1

## 2022-12-30 MED ORDER — ACETAMINOPHEN 325 MG PO TABS: 650.0000 mg | ORAL_TABLET | Freq: Four times a day (QID) | ORAL | Status: DC | PRN

## 2022-12-30 MED ORDER — ACETAMINOPHEN 650 MG RE SUPP: 650.0000 mg | Freq: Four times a day (QID) | RECTAL | Status: DC | PRN

## 2022-12-30 MED ORDER — MORPHINE SULFATE (PF) 4 MG/ML IV SOLN
4.0000 mg | Freq: Once | INTRAVENOUS | Status: AC
  Administered 2022-12-30: 4 mg via INTRAVENOUS
  Filled 2022-12-30: qty 1

## 2022-12-30 MED ORDER — HEPARIN BOLUS VIA INFUSION
5000.0000 [IU] | Freq: Once | INTRAVENOUS | Status: AC
  Administered 2022-12-30: 5000 [IU] via INTRAVENOUS
  Filled 2022-12-30: qty 5000

## 2022-12-30 MED ORDER — TECHNETIUM TO 99M ALBUMIN AGGREGATED: 4.0200 | Freq: Once | INTRAVENOUS | Status: DC | PRN

## 2022-12-30 MED ORDER — ONDANSETRON HCL 4 MG/2ML IJ SOLN: 4.0000 mg | Freq: Four times a day (QID) | INTRAMUSCULAR | Status: DC | PRN

## 2022-12-30 MED ORDER — IOHEXOL 350 MG/ML SOLN
100.0000 mL | Freq: Once | INTRAVENOUS | Status: AC | PRN
  Administered 2022-12-30: 75 mL via INTRAVENOUS

## 2022-12-30 MED ORDER — ONDANSETRON HCL 4 MG/2ML IJ SOLN
4.0000 mg | Freq: Once | INTRAMUSCULAR | Status: AC
  Administered 2022-12-30: 4 mg via INTRAVENOUS
  Filled 2022-12-30: qty 2

## 2022-12-30 MED ORDER — APIXABAN (ELIQUIS) EDUCATION KIT FOR DVT/PE PATIENTS: PACK | Freq: Once | Status: DC

## 2022-12-30 MED ORDER — MAGNESIUM HYDROXIDE 400 MG/5ML PO SUSP: 30.0000 mL | Freq: Every day | ORAL | Status: DC | PRN

## 2022-12-30 MED ORDER — WARFARIN SODIUM 5 MG PO TABS: 5.0000 mg | ORAL_TABLET | Freq: Every day | ORAL | Status: DC

## 2022-12-30 MED ORDER — HEPARIN (PORCINE) 25000 UT/250ML-% IV SOLN
1700.0000 [IU]/h | INTRAVENOUS | Status: DC
  Administered 2022-12-30 (×2): 1700 [IU]/h via INTRAVENOUS
  Filled 2022-12-30 (×2): qty 250

## 2022-12-30 MED ORDER — SODIUM CHLORIDE 0.9 % IV SOLN: INTRAVENOUS | Status: DC

## 2022-12-30 NOTE — ED Provider Notes (Signed)
[facility] Provider Note    Event Date/Time   First MD Initiated Contact with Patient 12/30/22 0134     (approximate)   History   Chest Pain   HPI  Dalton Pena is a 42 y.o. male with history of obesity, PE, DVT previously on anticoagulation but stopped due to not being able to afford it who presents emergency department left-sided sharp chest pain that radiated into the left arm with left arm numbness, shortness of breath that started tonight while at rest.  Symptoms have improved.  Does report bilateral leg pain worse on the right side.  States symptoms feel similar to his previous blood clots.  Denies fevers, cough, nausea, vomiting, diaphoresis or dizziness.   History provided by patient, family, EMS.    Past Medical History:  Diagnosis Date   Deep vein thrombosis (DVT) ([place])    Pulmonary embolism ([place])     History reviewed. No pertinent surgical history.  MEDICATIONS:  Prior to Admission medications   Medication Sig Start Date End Date Taking? Authorizing Provider  albuterol (PROVENTIL HFA) 108 (90 Base) MCG/ACT inhaler Inhale 2 puffs into the lungs every 4 (four) hours as needed for wheezing or shortness of breath. 01/30/22   Carrie Mew, MD  doxycycline (VIBRAMYCIN) 100 MG capsule Take 1 capsule (100 mg total) by mouth 2 (two) times daily. 01/30/22   Carrie Mew, MD  Elastic Bandages & Supports (MEDICAL COMPRESSION STOCKINGS) MISC Please provide 11mmHg compression stockings 08/04/22   Harvest Dark, MD  HYDROcodone-acetaminophen (NORCO/VICODIN) 5-325 MG tablet Take 1 tablet by mouth every 4 (four) hours as needed. 08/04/22   Harvest Dark, MD  RIVAROXABAN [employer]) VTE STARTER PACK (15 & 20 MG) Follow package directions: Take one 15mg  tablet by mouth twice a day. On day 22, switch to one 20mg  tablet once a day. Take with food. 01/30/22   Carrie Mew, MD    Physical Exam   Triage Vital Signs: ED Triage Vitals   Enc Vitals Group     BP 12/30/22 0143 135/69     Pulse Rate 12/30/22 0143 66     Resp 12/30/22 0143 19     Temp 12/30/22 0143 97.9 F (36.6 C)     Temp Source 12/30/22 0143 Oral     SpO2 12/30/22 0138 96 %     Weight 12/30/22 0146 (!) 318 lb 5.5 oz (144.4 kg)     Height 12/30/22 0146 5\' 6"  (1.676 m)     Head Circumference --      Peak Flow --      Pain Score 12/30/22 0142 7     Pain Loc --      Pain Edu? --      Excl. in [place]? --     Most recent vital signs: Vitals:   12/30/22 0415 12/30/22 0420  BP:    Pulse: 63 (!) 59  Resp: 17 18  Temp:    SpO2: 93% 95%    CONSTITUTIONAL: Alert, responds appropriately to questions. Well-appearing; well-nourished HEAD: Normocephalic, atraumatic EYES: Conjunctivae clear, pupils appear equal, sclera nonicteric ENT: normal nose; moist mucous membranes NECK: Supple, normal ROM CARD: RRR; S1 and S2 appreciated RESP: Normal chest excursion without splinting or tachypnea; breath sounds clear and equal bilaterally; no wheezes, no rhonchi, no rales, no hypoxia or respiratory distress, speaking full sentences ABD/GI: Non-distended; soft, non-tender, no rebound, no guarding, no peritoneal signs BACK: The back appears normal EXT: Normal ROM in all joints; no deformity noted, no  edema, right-sided calf tenderness SKIN: Normal color for age and race; warm; no rash on exposed skin NEURO: Moves all extremities equally, normal speech PSYCH: The patient's mood and manner are appropriate.   ED Results / Procedures / Treatments   LABS: (all labs ordered are listed, but only abnormal results are displayed) Labs Reviewed  BASIC METABOLIC PANEL - Abnormal; Notable for the following components:      Result Value   Glucose, Bld 127 (*)    Calcium 8.4 (*)    All other components within normal limits  CBC - Abnormal; Notable for the following components:   WBC 11.5 (*)    All other components within normal limits  PROTIME-INR  TROPONIN I (HIGH  SENSITIVITY)  TROPONIN I (HIGH SENSITIVITY)     EKG:  EKG Interpretation  Date/Time:  Tuesday December 30 2022 01:53:26 EDT Ventricular Rate:  63 PR Interval:  171 QRS Duration: 95 QT Interval:  416 QTC Calculation: 426 R Axis:   24 Text Interpretation: Sinus rhythm Confirmed by Pryor Curia [phone]) on 12/30/2022 1:59:26 AM         RADIOLOGY: My personal review and interpretation of imaging: CTA shows no PE.  Patient has a right popliteal DVT.  I have personally reviewed all radiology reports.   US Venous Img Lower Bilateral (DVT)  Result Date: 12/30/2022 CLINICAL DATA:  Bilateral leg pain EXAM: BILATERAL LOWER EXTREMITY VENOUS DOPPLER ULTRASOUND TECHNIQUE: Gray-scale sonography with graded compression, as well as color Doppler and duplex ultrasound were performed to evaluate the lower extremity deep venous systems from the level of the common femoral vein and including the common femoral, femoral, profunda femoral, popliteal and calf veins including the posterior tibial, peroneal and gastrocnemius veins when visible. The superficial great saphenous vein was also interrogated. Spectral Doppler was utilized to evaluate flow at rest and with distal augmentation maneuvers in the common femoral, femoral and popliteal veins. COMPARISON:  08/04/2022 FINDINGS: RIGHT LOWER EXTREMITY Common Femoral Vein: No evidence of thrombus. Normal compressibility, respiratory phasicity and response to augmentation. Saphenofemoral Junction: No evidence of thrombus. Normal compressibility and flow on color Doppler imaging. Profunda Femoral Vein: No evidence of thrombus. Normal compressibility and flow on color Doppler imaging. Femoral Vein: No evidence of thrombus. Normal compressibility, respiratory phasicity and response to augmentation. Popliteal Vein: Thrombus is noted with decreased compressibility. Calf Veins: No evidence of thrombus. Normal compressibility and flow on color Doppler imaging. Superficial  Great Saphenous Vein: No evidence of thrombus. Normal compressibility. Venous Reflux:  None. Other Findings:  None. LEFT LOWER EXTREMITY Common Femoral Vein: No evidence of thrombus. Normal compressibility, respiratory phasicity and response to augmentation. Saphenofemoral Junction: No evidence of thrombus. Normal compressibility and flow on color Doppler imaging. Profunda Femoral Vein: No evidence of thrombus. Normal compressibility and flow on color Doppler imaging. Femoral Vein: No evidence of thrombus. Normal compressibility, respiratory phasicity and response to augmentation. Popliteal Vein: No evidence of thrombus. Normal compressibility, respiratory phasicity and response to augmentation. Calf Veins: No evidence of thrombus. Normal compressibility and flow on color Doppler imaging. Superficial Great Saphenous Vein: No evidence of thrombus. Normal compressibility. Venous Reflux:  None. Other Findings:  None. IMPRESSION: Mild right popliteal thrombus with decreased compressibility. No other thrombus is noted. Electronically Signed   By: Inez Catalina M.D.   On: 12/30/2022 03:50   CT Angio Chest PE W and/or Wo Contrast  Result Date: 12/30/2022 CLINICAL DATA:  Chest pain EXAM: CT ANGIOGRAPHY CHEST WITH CONTRAST TECHNIQUE: Multidetector CT imaging of the chest  was performed using the standard protocol during bolus administration of intravenous contrast. Multiplanar CT image reconstructions and MIPs were obtained to evaluate the vascular anatomy. RADIATION DOSE REDUCTION: This exam was performed according to the departmental dose-optimization program which includes automated exposure control, adjustment of the mA and/or kV according to patient size and/or use of iterative reconstruction technique. CONTRAST:  34mL OMNIPAQUE IOHEXOL 350 MG/ML SOLN COMPARISON:  01/30/2022 FINDINGS: Cardiovascular: Contrast injection is sufficient to demonstrate satisfactory opacification of the pulmonary arteries to the segmental  level. There is no pulmonary embolus or evidence of right heart strain. The size of the main pulmonary artery is normal. Heart size is normal, with no pericardial effusion. The course and caliber of the aorta are normal. There is no atherosclerotic calcification. Opacification decreased due to pulmonary arterial phase contrast bolus timing. Mediastinum/Nodes: No mediastinal, hilar or axillary lymphadenopathy. Normal visualized thyroid. Thoracic esophageal course is normal. Lungs/Pleura: Airways are patent. No pleural effusion, lobar consolidation, pneumothorax or pulmonary infarction. Upper Abdomen: Contrast bolus timing is not optimized for evaluation of the abdominal organs. The visualized portions of the organs of the upper abdomen are normal. Musculoskeletal: No chest wall abnormality. No bony spinal canal stenosis. Review of the MIP images confirms the above findings. IMPRESSION: No pulmonary embolus or acute aortic syndrome. Electronically Signed   By: Ulyses Jarred M.D.   On: 12/30/2022 02:55     PROCEDURES:  Critical Care performed: YES  CRITICAL CARE Performed by: Cyril Mourning Keyla Milone   Total critical care time: 40 minutes  Critical care time was exclusive of separately billable procedures and treating other patients.  Critical care was necessary to treat or prevent imminent or life-threatening deterioration.  Critical care was time spent personally by me on the following activities: development of treatment plan with patient and/or surrogate as well as nursing, discussions with consultants, evaluation of patient's response to treatment, examination of patient, obtaining history from patient or surrogate, ordering and performing treatments and interventions, ordering and review of laboratory studies, ordering and review of radiographic studies, pulse oximetry and re-evaluation of patient's condition.       Marland Kitchen1-3 Lead EKG Interpretation  Performed by: Riham Polyakov, Delice Bison, DO Authorized by:  Kemara Quigley, Delice Bison, DO     Interpretation: normal     ECG rate:  66   ECG rate assessment: normal     Rhythm: sinus rhythm     Ectopy: none     Conduction: normal       IMPRESSION / MDM / ASSESSMENT AND PLAN / ED COURSE  I reviewed the triage vital signs and the nursing notes.    Patient here with chest pain, shortness of breath, right leg pain.  History of PE and DVT currently noncompliant with his anticoagulation.  The patient is on the cardiac monitor to evaluate for evidence of arrhythmia and/or significant heart rate changes.   DIFFERENTIAL DIAGNOSIS (includes but not limited to):   PE, DVT, ACS, doubt dissection, pneumothorax.  No symptoms for pneumonia, URI.  Does not appear volume overloaded and no history of CHF.   Patient's presentation is most consistent with acute presentation with potential threat to life or bodily function.   PLAN: Will obtain CTA of the chest and bilateral lower extremity Dopplers.  Will obtain cardiac labs.  Will give pain medication.   MEDICATIONS GIVEN IN ED: Medications  morphine (PF) 4 MG/ML injection 4 mg (4 mg Intravenous Given 12/30/22 0152)  ondansetron (ZOFRAN) injection 4 mg (4 mg Intravenous Given 12/30/22 0153)  iohexol (OMNIPAQUE) 350 MG/ML injection 100 mL (75 mLs Intravenous Contrast Given 12/30/22 0240)     ED COURSE:  3:01 AM  Pt's CTA reviewed and interpreted by myself and the radiologist and shows no PE or other acute abnormality.  First troponin negative.  Second pending.  Normal hemoglobin, electrolytes.  Ultrasound of the legs pending.  4:20 AM  Pt's ultrasound shows a right popliteal DVT.  I was informed by nurse that patient is now hypoxic in the mid to upper 80s on room air.  Sitting upright when this happened.  He states he is feeling worse.  Discussed with Dr. Pascal Lux who again reviewed the CTA of the chest that showed no abnormality but obviously the concern being has the DVT that was seen on ultrasound now moved into the  chest.  Unable to get a repeat CT at this time given he is already received a contrast bolus but given his hypoxia with DVT, will treat with IV heparin for possible PE and discussed with hospitalist.  Patient and girlfriend comfortable with this plan.   CONSULTS:  Consulted and discussed patient's case with hospitalist, Dr. Sidney Ace.  I have recommended admission and consulting physician agrees and will place admission orders.  Patient (and family if present) agree with this plan.   I reviewed all nursing notes, vitals, pertinent previous records.  All labs, EKGs, imaging ordered have been independently reviewed and interpreted by myself.    OUTSIDE RECORDS REVIEWED: Reviewed last admission in April 2022 for pneumonia.       FINAL CLINICAL IMPRESSION(S) / ED DIAGNOSES   Final diagnoses:  Nonspecific chest pain  Acute deep vein thrombosis (DVT) of right popliteal vein (HCC)  Acute respiratory failure with hypoxia [facility])     Rx / DC Orders   ED Discharge Orders          Ordered    Ambulatory Referral to Primary Care (Establish Care)        12/30/22 0410             Note:  This document was prepared using Dragon voice recognition software and may include unintentional dictation errors.   Ramey Schiff, Delice Bison, DO 12/30/22 [phone]

## 2022-12-30 NOTE — ED Notes (Signed)
Pt placed on 1.5 L [place] due to desats. Continues to present Bradycardiac.

## 2022-12-30 NOTE — Assessment & Plan Note (Addendum)
Resume usual home regimen at d/c.

## 2022-12-30 NOTE — Progress Notes (Signed)
ANTICOAGULATION CONSULT NOTE - Initial Consult  Pharmacy Consult for Heparin / Warfarin  Indication: pulmonary embolus and DVT  No Known Allergies  Patient Measurements: Height: 5\' 6"  (167.6 cm) Weight: (!) 144.4 kg (318 lb 5.5 oz) IBW/kg (Calculated) : 63.8 Heparin Dosing Weight: 99.1 kg   Vital Signs: Temp: 98.1 F (36.7 C) (03/26 1116) Temp Source: Oral (03/26 1116) BP: 152/80 (03/26 1116) Pulse Rate: 56 (03/26 1116)  Labs: Recent Labs    12/30/22 0152 12/30/22 0425 12/30/22 1244  HGB 14.1  --  14.8  HCT 43.7  --  44.9  PLT 261  --  293  LABPROT 13.3  --   --   INR 1.0  --   --   HEPARINUNFRC  --   --  0.40  CREATININE 1.09  --   --   TROPONINIHS 5 6  --      Estimated Creatinine Clearance: 123.5 mL/min (by C-G formula based on SCr of 1.09 mg/dL).   Medical History: Past Medical History:  Diagnosis Date   Deep vein thrombosis (DVT) ([place])    Pulmonary embolism (HCC)     Medications:  No medications prior to admission.    Assessment: Pharmacy consulted to dose heparin in this 42 year old male admitted with PE/DVT.  Pt was on Xarelto PTA for DVT/PE but states he has not taken Xarelto in about a year due to inability to afford medication.    - hospitalist wants to begin warfarin but will bridge with heparin until INR therapeutic   CrCl = 123.5 ml/min   Goal of Therapy:  INR :   2 - 3  Heparin level 0.3-0.7 units/ml Monitor platelets by anticoagulation protocol: Yes   Results: 3/26 1244 HL 0.4, therapeutic x 1  Plan:   Heparin: -Continue heparin drip at 1700 units/hr. -Recheck heparin level in 6 hours to confirm. -Daily CBC while on heparin.   Warfarin: Will order warfarin 10 mg PO X 1 on 3/26 @ 0600  Will recheck INR on 3/27 with AM labs and dose per nomogram thereafter.   Lorin Picket, PharmD 12/30/2022,1:13 PM

## 2022-12-30 NOTE — Progress Notes (Addendum)
Brief rounding note, same day as admission  HPI: Pt admitted earlier this AM after presenting with left-sided chest pain and leg pain, swelling. Found to have a RLE popliteal DVT. Started on heparin and admitted for further evaluation.  See H&P for detailed HPI on admission  Interval history: Pt seen in ED this AM.  Just returned from NM perfusion scan.  He denies any complaints.  Stays on his phone during the encounter.  Being moved to the floor.  Exam:  General exam: awake, alert, no acute distress, minimally interactive HEENT: moist mucus membranes, hearing grossly normal  Respiratory system: CTAB, no wheezes, rales or rhonchi, normal respiratory effort. Cardiovascular system: normal S1/S2, RRR, BLE edema more on right.   Central nervous system: no gross focal neurologic deficits, normal speech Skin: dry, intact, normal temperature Psychiatry: normal mood, flat affect, judgement and insight appear normal   A&P: as per H&P by Dr. Sidney Ace, with any changes or additions as below:  --follow up NM perfusion scan  --consider vascular consult if indicated based on above --continue IV heparin --transition to DOAC at d/c  --Bradycardia -- noted on telemetry as low as 30's at times. Seems asymptomatic. EKG ordered.  Continue telemetry.  May need cardiology consult.  Avoid BB's.     No charge

## 2022-12-30 NOTE — ED Notes (Signed)
Pt returned from [facility]

## 2022-12-30 NOTE — H&P (Signed)
PATIENT NAME: Dalton Pena    MR#:  [id number]  DATE OF BIRTH:  1980/10/31  DATE OF ADMISSION:  12/30/2022  PRIMARY CARE PHYSICIAN: Pcp, No   Patient is coming from: Home  REQUESTING/REFERRING PHYSICIAN: Ward, Delice Bison, DO  CHIEF COMPLAINT:   Chief Complaint  Patient presents with   Chest Pain    HISTORY OF PRESENT ILLNESS:  Dalton Pena is a 42 y.o. 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  He admitted to associated dyspnea and dry cough with this chest pain.  He denies any fever or chills.  No nausea or vomiting or abdominal pain.  No dysuria, oliguria or hematuria or flank pain.  No other bleeding diathesis.  No headache or dizziness or blurred vision.  ED Course: When he came to the ER, vital signs showed a pulse oximetry that was initially 94% and later dropped to 84% on room air and was up to 97% on 2 L of O2 by nasal cannula with otherwise normal vital signs.  Labs revealed unremarkable BMP.  CBC showed mild leukocytosis of 11.5.  High-sensitivity troponin was 5 and later 6.  INR was 1 and PT 13.3. EKG as reviewed by me : Normal sinus rhythm with a rate of 65 with poor R wave progression. Imaging: CTA of the chest came back negative for PE or acute aortic syndrome. Bilateral lower extremity venous Doppler to lower showed mild right popliteal artery thrombus with decreased compressibility with no other thrombus noted.  The patient was started on IV heparin with bolus and drip.  He will be admitted to a progressive unit bed for further evaluation and management.   PAST MEDICAL HISTORY:   Past Medical History:  Diagnosis Date   Deep vein thrombosis (DVT) ([place])     Pulmonary embolism ([place])     PAST SURGICAL HISTORY:  History reviewed. No pertinent surgical history.  SOCIAL HISTORY:   Social History   Tobacco Use   Smoking status: Every Day    Packs/day: .5    Types: Cigarettes   Smokeless tobacco: Never  Substance Use Topics   Alcohol use: Yes    Comment: occ.    FAMILY HISTORY:   Family History  Problem Relation Age of Onset   Heart failure Father     DRUG ALLERGIES:  No Known Allergies  REVIEW OF SYSTEMS:   ROS As per history of present illness. All pertinent systems were reviewed above. Constitutional, HEENT, cardiovascular, respiratory, GI, GU, musculoskeletal, neuro, psychiatric, endocrine, integumentary and hematologic systems were reviewed and are otherwise negative/unremarkable except for positive findings mentioned above in the HPI.   MEDICATIONS AT HOME:   Prior to Admission medications   Medication Sig Start Date End Date Taking? Authorizing Provider  albuterol (PROVENTIL HFA) 108 (90 Base) MCG/ACT inhaler Inhale 2 puffs into the lungs every 4 (four) hours as needed for wheezing or shortness of breath. 01/30/22   Carrie Mew, MD  doxycycline (VIBRAMYCIN) 100 MG capsule Take 1 capsule (100 mg total) by mouth 2 (two) times daily. 01/30/22   Carrie Mew, MD  Elastic Bandages & Supports (MEDICAL COMPRESSION STOCKINGS) [place] Please provide 45mmHg compression stockings 08/04/22   Paduchowski,  Lennette Bihari, MD  HYDROcodone-acetaminophen (NORCO/VICODIN) 5-325 MG tablet Take 1 tablet by mouth every 4 (four) hours as needed. 08/04/22   Harvest Dark, MD  RIVAROXABAN [employer]) VTE STARTER PACK (15 & 20 MG) Follow package directions: Take one 15mg  tablet by mouth twice a day. On day 22, switch to one 20mg  tablet once a day. Take with food. 01/30/22   Carrie Mew, MD      VITAL SIGNS:  Blood pressure (!) 143/71, pulse (!) 57, temperature 97.9 F (36.6 C), temperature source Oral, resp. rate 19, height 5\' 6"  (1.676  m), weight (!) 144.4 kg, SpO2 97 %.  PHYSICAL EXAMINATION:  Physical Exam  GENERAL:  42 y.o.-year-old African-American male patient lying in the bed with no acute distress.  EYES: Pupils equal, round, reactive to light and accommodation. No scleral icterus. Extraocular muscles intact.  HEENT: Head atraumatic, normocephalic. Oropharynx and nasopharynx clear.  NECK:  Supple, no jugular venous distention. No thyroid enlargement, no tenderness.  LUNGS: Normal breath sounds bilaterally, no wheezing, rales,rhonchi or crepitation. No use of accessory muscles of respiration.  CARDIOVASCULAR: Regular rate and rhythm, S1, S2 normal. No murmurs, rubs, or gallops.  ABDOMEN: Soft, nondistended, nontender. Bowel sounds present. No organomegaly or mass.  EXTREMITIES: No pedal edema, cyanosis, or clubbing.  NEUROLOGIC: Cranial nerves II through XII are intact. Muscle strength 5/5 in all extremities. Sensation intact. Gait not checked.  PSYCHIATRIC: The patient is alert and oriented x 3.  Normal affect and good eye contact. SKIN: No obvious rash, lesion, or ulcer.   LABORATORY PANEL:   CBC Recent Labs  Lab 12/30/22 0152  WBC 11.5*  HGB 14.1  HCT 43.7  PLT 261   ------------------------------------------------------------------------------------------------------------------  Chemistries  Recent Labs  Lab 12/30/22 0152  NA 139  K 3.8  CL 106  CO2 25  GLUCOSE 127*  BUN 17  CREATININE 1.09  CALCIUM 8.4*   ------------------------------------------------------------------------------------------------------------------  Cardiac Enzymes No results for input(s): "TROPONINI" in the last 168 hours. ------------------------------------------------------------------------------------------------------------------  RADIOLOGY:  US Venous Img Lower Bilateral (DVT)  Result Date: 12/30/2022 CLINICAL DATA:  Bilateral leg pain EXAM: BILATERAL LOWER EXTREMITY VENOUS DOPPLER ULTRASOUND TECHNIQUE:  Gray-scale sonography with graded compression, as well as color Doppler and duplex ultrasound were performed to evaluate the lower extremity deep venous systems from the level of the common femoral vein and including the common femoral, femoral, profunda femoral, popliteal and calf veins including the posterior tibial, peroneal and gastrocnemius veins when visible. The superficial great saphenous vein was also interrogated. Spectral Doppler was utilized to evaluate flow at rest and with distal augmentation maneuvers in the common femoral, femoral and popliteal veins. COMPARISON:  08/04/2022 FINDINGS: RIGHT LOWER EXTREMITY Common Femoral Vein: No evidence of thrombus. Normal compressibility, respiratory phasicity and response to augmentation. Saphenofemoral Junction: No evidence of thrombus. Normal compressibility and flow on color Doppler imaging. Profunda Femoral Vein: No evidence of thrombus. Normal compressibility and flow on color Doppler imaging. Femoral Vein: No evidence of thrombus. Normal compressibility, respiratory phasicity and response to augmentation. Popliteal Vein: Thrombus is noted with decreased compressibility. Calf Veins: No evidence of thrombus. Normal compressibility and flow on color Doppler imaging. Superficial Great Saphenous Vein: No evidence of thrombus. Normal compressibility. Venous Reflux:  None. Other Findings:  None. LEFT LOWER EXTREMITY Common Femoral Vein: No evidence of thrombus. Normal compressibility, respiratory phasicity and response to augmentation. Saphenofemoral Junction: No evidence of thrombus. Normal compressibility and flow on color Doppler imaging. Profunda Femoral Vein: No evidence of thrombus. Normal compressibility and flow on  color Doppler imaging. Femoral Vein: No evidence of thrombus. Normal compressibility, respiratory phasicity and response to augmentation. Popliteal Vein: No evidence of thrombus. Normal compressibility, respiratory phasicity and response to  augmentation. Calf Veins: No evidence of thrombus. Normal compressibility and flow on color Doppler imaging. Superficial Great Saphenous Vein: No evidence of thrombus. Normal compressibility. Venous Reflux:  None. Other Findings:  None. IMPRESSION: Mild right popliteal thrombus with decreased compressibility. No other thrombus is noted. Electronically Signed   By: Inez Catalina M.D.   On: 12/30/2022 03:50   CT Angio Chest PE W and/or Wo Contrast  Result Date: 12/30/2022 CLINICAL DATA:  Chest pain EXAM: CT ANGIOGRAPHY CHEST WITH CONTRAST TECHNIQUE: Multidetector CT imaging of the chest was performed using the standard protocol during bolus administration of intravenous contrast. Multiplanar CT image reconstructions and MIPs were obtained to evaluate the vascular anatomy. RADIATION DOSE REDUCTION: This exam was performed according to the departmental dose-optimization program which includes automated exposure control, adjustment of the mA and/or kV according to patient size and/or use of iterative reconstruction technique. CONTRAST:  70mL OMNIPAQUE IOHEXOL 350 MG/ML SOLN COMPARISON:  01/30/2022 FINDINGS: Cardiovascular: Contrast injection is sufficient to demonstrate satisfactory opacification of the pulmonary arteries to the segmental level. There is no pulmonary embolus or evidence of right heart strain. The size of the main pulmonary artery is normal. Heart size is normal, with no pericardial effusion. The course and caliber of the aorta are normal. There is no atherosclerotic calcification. Opacification decreased due to pulmonary arterial phase contrast bolus timing. Mediastinum/Nodes: No mediastinal, hilar or axillary lymphadenopathy. Normal visualized thyroid. Thoracic esophageal course is normal. Lungs/Pleura: Airways are patent. No pleural effusion, lobar consolidation, pneumothorax or pulmonary infarction. Upper Abdomen: Contrast bolus timing is not optimized for evaluation of the abdominal organs. The  visualized portions of the organs of the upper abdomen are normal. Musculoskeletal: No chest wall abnormality. No bony spinal canal stenosis. Review of the MIP images confirms the above findings. IMPRESSION: No pulmonary embolus or acute aortic syndrome. Electronically Signed   By: Ulyses Jarred M.D.   On: 12/30/2022 02:55      IMPRESSION AND PLAN:  Assessment and Plan: * Acute deep vein thrombosis (DVT) of calf muscle vein of right lower extremity ([place]) - The patient has a history of recurrent PE and DVT. - The patient will be admitted to a progressive unit bed. - We will continue on IV heparin. - We will start on p.o. Coumadin as he has not been able to afford Xarelto, Eliquis or Lovenox. - We will follow daily INR.  Acute respiratory failure with hypoxia (HCC) - We will obtain a VQ scan to rule out small PE.  His chest CTA was negative.  Chronic obstructive pulmonary disease (COPD) ([place]) - He will be continued on as needed albuterol. - We will hold off long-acting beta agonist.  Tobacco abuse - Will be counseled  for smoking cessation.    DVT prophylaxis: IV heparin and Coumadin. Advanced Care Planning:  Code Status: full code.  Family Communication:  The plan of care was discussed in details with the patient (and family). I answered all questions. The patient agreed to proceed with the above mentioned plan. Further management will depend upon hospital course. Disposition Plan: Back to previous home environment Consults called: none. All the records are reviewed and case discussed with ED provider.  Status is: Inpatient   At the time of the admission, it appears that the appropriate admission status for this patient is inpatient.  This is judged to be reasonable and necessary in order to provide the required intensity of service to ensure the patient's safety given the presenting symptoms, physical exam findings and initial radiographic and laboratory data in the context of  comorbid conditions.  The patient requires inpatient status due to high intensity of service, high risk of further deterioration and high frequency of surveillance required.  I certify that at the time of admission, it is my clinical judgment that the patient will require inpatient hospital care extending more than 2 midnights.                            Dispo: The patient is from: Home              Anticipated d/c is to: Home              Patient currently is not medically stable to d/c.              Difficult to place patient: No  Christel Mormon M.D on 12/30/2022 at 6:13 AM  [facility]   From 7 PM-7 AM, contact night-coverage [email or web address]  CC: Primary care physician; Pcp, No

## 2022-12-30 NOTE — Assessment & Plan Note (Addendum)
CTA chest was negative. NM perfusion scan was performed - showed "apparent segmental perfusion defects" at the lingula and superior LUL felt of uncertain significance, since CTA was negative and would have shown clot.   O2 sats stable on room air. Hypoxia was brief and resolved.

## 2022-12-30 NOTE — Assessment & Plan Note (Addendum)
Counseled  for smoking cessation. Advised smoking increases risk of blood clots.

## 2022-12-30 NOTE — ED Triage Notes (Signed)
Patient brought in via medic after awaking from sleep 1.15 hour ago with left side chest pain radiating down left arm. Patient reports hx of same pain, PE,and ble DVT. Patient denies sob, fever, n/v/d

## 2022-12-30 NOTE — Assessment & Plan Note (Addendum)
Managed with IV Heparin Discharged on Eliquis PCP follow up.

## 2022-12-30 NOTE — Progress Notes (Signed)
NT in to take patients VS. He refused and stated he was leaving. NT called RN. RN Environmental health practitioner. Patient educated on risks of leaving without completing treatment regimen. On call provider notified. Pt refused to sign AMA form. Charge nurse removed telemetry and IV access and patient left the floor.

## 2022-12-30 NOTE — Progress Notes (Addendum)
ANTICOAGULATION CONSULT NOTE - Initial Consult  Pharmacy Consult for Heparin / Warfarin  Indication: pulmonary embolus and DVT  No Known Allergies  Patient Measurements: Height: 5\' 6"  (167.6 cm) Weight: (!) 144.4 kg (318 lb 5.5 oz) IBW/kg (Calculated) : 63.8 Heparin Dosing Weight: 99.1 kg   Vital Signs: Temp: 97.9 F (36.6 C) (03/26 0143) Temp Source: Oral (03/26 0143) BP: 125/68 (03/26 0400) Pulse Rate: 59 (03/26 0420)  Labs: Recent Labs    12/30/22 0152  HGB 14.1  HCT 43.7  PLT 261  LABPROT 13.3  INR 1.0  CREATININE 1.09  TROPONINIHS 5    Estimated Creatinine Clearance: 123.5 mL/min (by C-G formula based on SCr of 1.09 mg/dL).   Medical History: Past Medical History:  Diagnosis Date   Deep vein thrombosis (DVT) ([facility])    Pulmonary embolism ([facility])     Medications:  (Not in a hospital admission)   Assessment: Pharmacy consulted to dose heparin in this 42 year old male admitted with PE/DVT.  Pt was on Xarelto PTA for DVT/PE but states he has not taken Xarelto in about a year due to inability to afford medication.    - hospitalist wants to begin warfarin but will bridge with heparin until INR therapeutic   CrCl = 123.5 ml/min   Goal of Therapy:  INR :   2 - 3  Heparin level 0.3-0.7 units/ml Monitor platelets by anticoagulation protocol: Yes   Plan:  Will order baseline HL, aPTT and INR to make sure no prior anticoag.  Give 5000 units bolus x 1 Start heparin infusion at 1700 units/hr Check anti-Xa level in 6 hours and daily while on heparin Continue to monitor H&H and platelets  Will order warfarin 10 mg PO X 1 on 3/26 @ 0600  Will recheck INR on 3/27 with AM labs and dose per nomogram thereafter.   Banessa Mao D 12/30/2022,4:37 AM

## 2022-12-30 NOTE — ED Notes (Signed)
Please call Rose when patient gets bed and is moved [phone]

## 2022-12-30 NOTE — Progress Notes (Signed)
       CROSS COVER NOTE  NAME: DASHAWN BICKNELL MRN: [id number] DOB : [birth year] ATTENDING PHYSICIAN:GRIFFITH, KELLY DO                                                 Against Medical Advice  Patient at this time expresses desire to leave the Hospital immediately. Patient has full decision making capacity and understands and accepts the risks involved and assumes full responsibilty of this decision.    This document was prepared using Dragon voice recognition software and may include unintentional dictation errors.  Bishop Limbo DNP, MBA, FNP-BC, PMHNP-BC Nurse Practitioner [facility] Pager [phone]

## 2022-12-30 NOTE — ED Notes (Signed)
Pt is in Transport to Nuclear Medicine

## 2023-01-09 NOTE — Discharge Summary (Signed)
Physician Discharge Summary   Patient: Dalton Pena MRN: [id number] DOB: Feb 22, 1981  Admit date:     12/30/2022  Discharge date: 12/30/2022  Discharge Physician: Pennie BanterKelly A Armoni Depass   PCP: Pcp, No   Recommendations at discharge:    Follow up with primary care  Discharge Diagnoses: Principal Problem:   Acute deep vein thrombosis (DVT) of calf muscle vein of right lower extremity Active Problems:   Acute respiratory failure with hypoxia   Tobacco abuse   Chronic obstructive pulmonary disease (COPD)  Resolved Problems:   * No resolved hospital problems. *  Hospital Course: HPI on admission by Dr. Arville CareMansy: "Dalton Figuresichard L Ziller is a 42 y.o. 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  He admitted to associated dyspnea and dry cough with this chest pain.  He denies any fever or chills.  No nausea or vomiting or abdominal pain.  No dysuria, oliguria or hematuria or flank pain.  No other bleeding diathesis.  No headache or dizziness or blurred vision.   ED Course: When he came to the ER, vital signs showed a pulse oximetry that was initially 94% and later dropped to 84% on room air and was up to 97% on 2 L of O2 by nasal cannula with otherwise normal vital signs.  Labs revealed unremarkable BMP.  CBC showed mild leukocytosis of 11.5.  High-sensitivity troponin was 5 and later 6.  INR was 1 and PT 13.3. EKG as reviewed by me : Normal sinus rhythm with a rate of 65 with poor R wave progression. Imaging: CTA of the chest came back negative for PE or acute aortic syndrome. Bilateral lower extremity venous Doppler to lower showed mild right popliteal artery thrombus with decreased compressibility with no other thrombus  noted.   The patient was started on IV heparin with bolus and drip.  He will be admitted to a progressive unit bed for further evaluation and management."     Assessment and Plan: * Acute deep vein thrombosis (DVT) of calf muscle vein of right lower extremity Managed with IV Heparin Discharged on Eliquis PCP follow up.  Acute respiratory failure with hypoxia CTA chest was negative. NM perfusion scan was performed - showed "apparent segmental perfusion defects" at the lingula and superior LUL felt of uncertain significance, since CTA was negative and would have shown clot.   O2 sats stable on room air. Hypoxia was brief and resolved.  Chronic obstructive pulmonary disease (COPD) Resume usual home regimen at d/c.  Tobacco abuse Counseled  for smoking cessation. Advised smoking increases risk of blood clots.         Consultants: None Procedures performed: None  Disposition: Home Diet recommendation:  Regular diet DISCHARGE MEDICATION: Allergies as of 12/30/2022   No Known Allergies      Medication List    You have not been prescribed any medications.     Discharge Exam: Filed Weights   12/30/22 0146  Weight: (!) 144.4 kg   General exam: awake, alert, no acute distress, minimally interactive, stays on phone during encounter HEENT: moist mucus membranes, hearing grossly normal  Respiratory system: CTAB, no wheezes, rales or rhonchi, normal respiratory effort. Cardiovascular system: normal S1/S2, RRR, no pedal edema.   Gastrointestinal  system: soft, NT, ND Central nervous system: A&O x4. no gross focal neurologic deficits, normal speech Extremities: moves all , no edema, normal tone Skin: dry, intact, normal temperature Psychiatry: normal mood, flat affect, judgement and insight appear normal   Condition at discharge: stable  The results of significant diagnostics from this hospitalization (including imaging, microbiology, ancillary and laboratory) are  listed below for reference.   Imaging Studies: NM Pulmonary Perfusion  Result Date: 12/30/2022 CLINICAL DATA:  Chest pain, history of PE 2 years ago EXAM: NUCLEAR MEDICINE PERFUSION LUNG SCAN TECHNIQUE: Perfusion images were obtained in multiple projections after intravenous injection of radiopharmaceutical. Ventilation scans intentionally deferred if perfusion scan and chest x-ray adequate for interpretation during COVID 19 epidemic. RADIOPHARMACEUTICALS:  4.02 mCi Tc-9461m MAA IV COMPARISON:  Same-day chest radiograph, 12/30/2022, same-day CT chest angiogram, 12/30/2022, 2:48 a.m. FINDINGS: Cardiomegaly. Apparent segmental perfusion defects involving the lingula and superior segment left lower lobe in the superior segment left lower lobe with stripe sign. Normal perfusion of the right lung. IMPRESSION: 1. Apparent segmental perfusion defects involving the lingula and superior segment left lower lobe on current nuclear scintigraphic perfusion study, of uncertain significance given absence of acute pulmonary embolism on CT pulmonary angiogram performed several hours prior. Findings are technically categorized as intermediate probability for pulmonary embolism by modified perfusion only PIOPED criteria. 2.  Cardiomegaly. Electronically Signed   By: Jearld LeschAlex D Bibbey M.D.   On: 12/30/2022 11:03   DG Chest Port 1 View  Result Date: 12/30/2022 CLINICAL DATA:  Provided history: Dyspnea. Chest pain. History of DVT/PE. EXAM: PORTABLE CHEST 1 VIEW COMPARISON:  CT angiogram chest 12/30/2022. Prior chest radiographs 12/13/2021. FINDINGS: A small portion of the right lateral costophrenic angle is excluded from the field of view. Within this limitation, findings are as follows. Heart size within normal limits. Ill-defined opacity within the left lung base. No appreciable airspace consolidation on the right. No evidence of pleural effusion or pneumothorax. No acute osseous abnormality identified. IMPRESSION: 1. A portion of  the right lateral costophrenic angle is excluded from the field of view. 2. Ill-defined opacity within the left lung base. This may reflect atelectasis. However, early pneumonia cannot be excluded. Correlate clinically and consider short-interval radiographic follow-up. Electronically Signed   By: Jackey LogeKyle  Golden D.O.   On: 12/30/2022 08:30   US Venous Img Lower Bilateral (DVT)  Result Date: 12/30/2022 CLINICAL DATA:  Bilateral leg pain EXAM: BILATERAL LOWER EXTREMITY VENOUS DOPPLER ULTRASOUND TECHNIQUE: Gray-scale sonography with graded compression, as well as color Doppler and duplex ultrasound were performed to evaluate the lower extremity deep venous systems from the level of the common femoral vein and including the common femoral, femoral, profunda femoral, popliteal and calf veins including the posterior tibial, peroneal and gastrocnemius veins when visible. The superficial great saphenous vein was also interrogated. Spectral Doppler was utilized to evaluate flow at rest and with distal augmentation maneuvers in the common femoral, femoral and popliteal veins. COMPARISON:  08/04/2022 FINDINGS: RIGHT LOWER EXTREMITY Common Femoral Vein: No evidence of thrombus. Normal compressibility, respiratory phasicity and response to augmentation. Saphenofemoral Junction: No evidence of thrombus. Normal compressibility and flow on color Doppler imaging. Profunda Femoral Vein: No evidence of thrombus. Normal compressibility and flow on color Doppler imaging. Femoral Vein: No evidence of thrombus. Normal compressibility, respiratory phasicity and response to augmentation. Popliteal Vein: Thrombus is noted with decreased compressibility. Calf Veins: No evidence of thrombus. Normal compressibility and flow on color Doppler imaging. Superficial Great Saphenous Vein: No evidence of thrombus. Normal  compressibility. Venous Reflux:  None. Other Findings:  None. LEFT LOWER EXTREMITY Common Femoral Vein: No evidence of thrombus.  Normal compressibility, respiratory phasicity and response to augmentation. Saphenofemoral Junction: No evidence of thrombus. Normal compressibility and flow on color Doppler imaging. Profunda Femoral Vein: No evidence of thrombus. Normal compressibility and flow on color Doppler imaging. Femoral Vein: No evidence of thrombus. Normal compressibility, respiratory phasicity and response to augmentation. Popliteal Vein: No evidence of thrombus. Normal compressibility, respiratory phasicity and response to augmentation. Calf Veins: No evidence of thrombus. Normal compressibility and flow on color Doppler imaging. Superficial Great Saphenous Vein: No evidence of thrombus. Normal compressibility. Venous Reflux:  None. Other Findings:  None. IMPRESSION: Mild right popliteal thrombus with decreased compressibility. No other thrombus is noted. Electronically Signed   By: Alcide Clever M.D.   On: 12/30/2022 03:50   CT Angio Chest PE W and/or Wo Contrast  Result Date: 12/30/2022 CLINICAL DATA:  Chest pain EXAM: CT ANGIOGRAPHY CHEST WITH CONTRAST TECHNIQUE: Multidetector CT imaging of the chest was performed using the standard protocol during bolus administration of intravenous contrast. Multiplanar CT image reconstructions and MIPs were obtained to evaluate the vascular anatomy. RADIATION DOSE REDUCTION: This exam was performed according to the departmental dose-optimization program which includes automated exposure control, adjustment of the mA and/or kV according to patient size and/or use of iterative reconstruction technique. CONTRAST:  75mL OMNIPAQUE IOHEXOL 350 MG/ML SOLN COMPARISON:  01/30/2022 FINDINGS: Cardiovascular: Contrast injection is sufficient to demonstrate satisfactory opacification of the pulmonary arteries to the segmental level. There is no pulmonary embolus or evidence of right heart strain. The size of the main pulmonary artery is normal. Heart size is normal, with no pericardial effusion. The course  and caliber of the aorta are normal. There is no atherosclerotic calcification. Opacification decreased due to pulmonary arterial phase contrast bolus timing. Mediastinum/Nodes: No mediastinal, hilar or axillary lymphadenopathy. Normal visualized thyroid. Thoracic esophageal course is normal. Lungs/Pleura: Airways are patent. No pleural effusion, lobar consolidation, pneumothorax or pulmonary infarction. Upper Abdomen: Contrast bolus timing is not optimized for evaluation of the abdominal organs. The visualized portions of the organs of the upper abdomen are normal. Musculoskeletal: No chest wall abnormality. No bony spinal canal stenosis. Review of the MIP images confirms the above findings. IMPRESSION: No pulmonary embolus or acute aortic syndrome. Electronically Signed   By: Deatra Robinson M.D.   On: 12/30/2022 02:55    Microbiology: Results for orders placed or performed during the hospital encounter of 01/30/22  Resp Panel by RT-PCR (Flu A&B, Covid) Nasopharyngeal Swab     Status: None   Collection Time: 01/30/22  5:02 AM   Specimen: Nasopharyngeal Swab; Nasopharyngeal(NP) swabs in vial transport medium  Result Value Ref Range Status   SARS Coronavirus 2 by RT PCR NEGATIVE NEGATIVE Final    Comment: (NOTE) SARS-CoV-2 target nucleic acids are NOT DETECTED.  The SARS-CoV-2 RNA is generally detectable in upper respiratory specimens during the acute phase of infection. The lowest concentration of SARS-CoV-2 viral copies this assay can detect is 138 copies/mL. A negative result does not preclude SARS-Cov-2 infection and should not be used as the sole basis for treatment or other patient management decisions. A negative result may occur with  improper specimen collection/handling, submission of specimen other than nasopharyngeal swab, presence of viral mutation(s) within the areas targeted by this assay, and inadequate number of viral copies(<138 copies/mL). A negative result must be combined  with clinical observations, patient history, and epidemiological information. The expected result  is Negative.  Fact Sheet for Patients:  BloggerCourse.com  Fact Sheet for Healthcare Providers:  SeriousBroker.it  This test is no t yet approved or cleared by the [place] FDA and  has been authorized for detection and/or diagnosis of SARS-CoV-2 by FDA under an Emergency Use Authorization (EUA). This EUA will remain  in effect (meaning this test can be used) for the duration of the COVID-19 declaration under Section 564(b)(1) of the Act, 21 U.S.C.section 360bbb-3(b)(1), unless the authorization is terminated  or revoked sooner.       Influenza A by PCR NEGATIVE NEGATIVE Final   Influenza B by PCR NEGATIVE NEGATIVE Final    Comment: (NOTE) The Xpert Xpress SARS-CoV-2/FLU/RSV plus assay is intended as an aid in the diagnosis of influenza from Nasopharyngeal swab specimens and should not be used as a sole basis for treatment. Nasal washings and aspirates are unacceptable for Xpert Xpress SARS-CoV-2/FLU/RSV testing.  Fact Sheet for Patients: BloggerCourse.com  Fact Sheet for Healthcare Providers: SeriousBroker.it  This test is not yet approved or cleared by the [place] FDA and has been authorized for detection and/or diagnosis of SARS-CoV-2 by FDA under an Emergency Use Authorization (EUA). This EUA will remain in effect (meaning this test can be used) for the duration of the COVID-19 declaration under Section 564(b)(1) of the Act, 21 U.S.C. section 360bbb-3(b)(1), unless the authorization is terminated or revoked.  Performed at [facility], [address] [place], [place] [address]     Labs: CBC: No results for input(s): "WBC", "NEUTROABS", "HGB", "HCT", "MCV", "PLT" in the last 168 hours. Basic Metabolic Panel: No results for input(s): "NA", "K",  "CL", "CO2", "GLUCOSE", "BUN", "CREATININE", "CALCIUM", "MG", "PHOS" in the last 168 hours. Liver Function Tests: No results for input(s): "AST", "ALT", "ALKPHOS", "BILITOT", "PROT", "ALBUMIN" in the last 168 hours. CBG: No results for input(s): "GLUCAP" in the last 168 hours.  Discharge time spent: less than 30 minutes.  Signed: Pennie Banter, DO [facility] 01/09/2023

## 2024-01-19 ENCOUNTER — Emergency Department

## 2024-01-19 ENCOUNTER — Emergency Department
Admission: EM | Admit: 2024-01-19 | Discharge: 2024-01-19 | Disposition: A | Discharge status: Home / Self Care | Attending: Emergency Medicine | Admitting: Emergency Medicine

## 2024-01-19 ENCOUNTER — Other Ambulatory Visit: Payer: Self-pay

## 2024-01-19 DIAGNOSIS — M4714 Other spondylosis with myelopathy, thoracic region: Secondary | ICD-10-CM

## 2024-01-19 DIAGNOSIS — R200 Anesthesia of skin: Secondary | ICD-10-CM | POA: Diagnosis present

## 2024-01-19 DIAGNOSIS — R0602 Shortness of breath: Secondary | ICD-10-CM | POA: Insufficient documentation

## 2024-01-19 LAB — BASIC METABOLIC PANEL WITH GFR
Anion gap: 6 (ref 5–15)
BUN: 13 mg/dL (ref 6–20)
CO2: 29 mmol/L (ref 22–32)
Calcium: 9.1 mg/dL (ref 8.9–10.3)
Chloride: 103 mmol/L (ref 98–111)
Creatinine, Ser: 1.11 mg/dL (ref 0.61–1.24)
GFR, Estimated: >60 mL/min (ref >60)
Glucose, Bld: 120 mg/dL — ABNORMAL HIGH (ref 70–99)
Potassium: 3.9 mmol/L (ref 3.5–5.1)
Sodium: 138 mmol/L (ref 135–145)

## 2024-01-19 LAB — HEPATIC FUNCTION PANEL
ALT: 41 U/L (ref 0–44)
AST: 34 U/L (ref 15–41)
Albumin: 3.4 g/dL — ABNORMAL LOW (ref 3.5–5.0)
Alkaline Phosphatase: 113 U/L (ref 38–126)
Bilirubin, Direct: 0.2 mg/dL (ref 0.0–0.2)
Indirect Bilirubin: 1 mg/dL — ABNORMAL HIGH (ref 0.3–0.9)
Total Bilirubin: 1.2 mg/dL (ref 0.0–1.2)
Total Protein: 7.3 g/dL (ref 6.5–8.1)

## 2024-01-19 LAB — APTT: aPTT: 28 s (ref 24–36)

## 2024-01-19 LAB — CBC
HCT: 47.1 % (ref 39.0–52.0)
Hemoglobin: 15.7 g/dL (ref 13.0–17.0)
MCH: 32.5 pg (ref 26.0–34.0)
MCHC: 33.3 g/dL (ref 30.0–36.0)
MCV: 97.5 fL (ref 80.0–100.0)
Platelets: 295 10*3/uL (ref 150–400)
RBC: 4.83 MIL/uL (ref 4.22–5.81)
RDW: 13.6 % (ref 11.5–15.5)
WBC: 12.3 10*3/uL — ABNORMAL HIGH (ref 4.0–10.5)
nRBC: 0 % (ref 0.0–0.2)

## 2024-01-19 LAB — PROTIME-INR
INR: 0.9 (ref 0.8–1.2)
Prothrombin Time: 12.8 s (ref 11.4–15.2)

## 2024-01-19 LAB — BRAIN NATRIURETIC PEPTIDE: B Natriuretic Peptide: 29.5 pg/mL (ref 0.0–100.0)

## 2024-01-19 LAB — TROPONIN I (HIGH SENSITIVITY)
Troponin I (High Sensitivity): 24 ng/L — ABNORMAL HIGH (ref <18)
Troponin I (High Sensitivity): 8 ng/L (ref <18)

## 2024-01-19 MED ORDER — IOHEXOL 350 MG/ML SOLN
100.0000 mL | Freq: Once | INTRAVENOUS | Status: AC | PRN
  Administered 2024-01-19: 100 mL via INTRAVENOUS

## 2024-01-19 MED ORDER — RIVAROXABAN 20 MG PO TABS: 20.0000 mg | ORAL_TABLET | Freq: Every day | ORAL | 1 refills | Status: DC

## 2024-01-19 NOTE — ED Notes (Signed)
 Called lab to add on new blood work

## 2024-01-19 NOTE — ED Provider Notes (Addendum)
-----------------------------------------   3:24 PM on 01/19/2024 -----------------------------------------  Blood pressure (!) 144/113, pulse 69, temperature 98.1 F (36.7 C), temperature source Oral, resp. rate 20, SpO2 98%.  Assuming care from Dr. Peggi Bowels.  In short, Dalton Pena is a 43 y.o. male with a chief complaint of Numbness (Bilateral legs) .  Refer to the original H&P for additional details.  The current plan of care is to follow-up CT and MRI imaging.  ----------------------------------------- 6:32 PM on 01/19/2024 ----------------------------------------- CTA chest is negative for PE or other acute process, initial troponin very mildly elevated but normalized on recheck.  EKG shows no ischemic changes and patient denies any chest pain, overall low suspicion for ACS at this time and shortness of breath may be managed as an outpatient, we will refill his Xarelto and refer to cardiology.  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  Case discussed with Dr. Felipe Horton of neurosurgery, who evaluated the patient and will arrange for follow-up in 6 days to discuss surgical options.  He did recommend patient undergo MRI of his cervical spine, which was performed however patient was unwilling to stay in the ED for his results.  He expresses understanding of risk that critical issue could be found on his MRI and we would be unable to address this if he leaves prior to results.  Patient appropriate for discharge home with outpatient follow-up with cardiology and neurosurgery, counseled to return to the ED for new or worsening symptoms.  Patient agrees with plan.         Twilla Galea, MD 01/19/24 Dalton Pena    Twilla Galea, MD 01/19/24 [phone]

## 2024-01-19 NOTE — Consult Note (Signed)
 Consulting Department:  Emergency department  Primary Physician:  Pcp, No  Chief Complaint: Lower extremity tingling  History of Present Illness: 01/19/2024 Dalton Pena is a 43 y.o. male who presents with the chief complaint of abdominal pain and lower extremity tingling.  He states that over the past 4 months he has had difficulty with walking and progressive numbness in his legs.  He states that his legs feel like they are always swollen and tingling.  He states that if he stands up for long periods of time he gets weakness in his bilateral lower extremities.  This has been slowly progressing over the past 4 months.  He does not have any bowel or bladder difficulties.  He does have trouble walking.  He does have intermittent numbness and tingling in his legs.  He states that his strength has not stayed weak and that he does better when he is resting.  He has not had any physical therapy.  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  He is currently on blood thinners   The symptoms are causing a significant impact on the patient's life.   Review of Systems:  A 10 point review of systems is negative, except for the pertinent positives and negatives detailed in the HPI.  Past Medical History: Past Medical History:  Diagnosis Date   Deep vein thrombosis (DVT) (HCC)    Pulmonary embolism (HCC)     Past Surgical History: History reviewed. No pertinent surgical history.  Allergies: Allergies as of 01/19/2024   (No Known Allergies)    Medications: Patient states he is on blood thinners for his DVT history.   Social History: Social History   Tobacco Use   Smoking status: Every Day    Current packs/day: 0.50    Types: Cigarettes   Smokeless tobacco: Never  Vaping Use   Vaping status: Every Day  Substance Use Topics   Alcohol use: Yes    Comment: occ.   Drug use: Yes    Types: Marijuana    Family Medical History: Family History  Problem Relation Age of  Onset   Heart failure Father     Physical Examination: Vitals:   01/19/24 1204  BP: (!) 144/113  Pulse: 69  Resp: 20  Temp: 98.1 F (36.7 C)  SpO2: 98%     General: Patient is well developed, well nourished, calm, collected, and in no apparent distress.  NEUROLOGICAL:  General: In no acute distress.   Awake, alert, oriented to person, place, and time.  Pupils equal round and reactive to light.  Facial tone is symmetric.  Tongue protrusion is midline.  There is no pronator drift.  Strength: Side Biceps Triceps Deltoid Interossei Grip Wrist Ext. Wrist Flex.  R 5 5 5 5 5 5 5   L 5 5 5 5 5 5 5    Side Iliopsoas Quads Hamstring PF DF EHL  R 5 5 5 5 5  4+  L 5 5 5 5 5  4+    Bilateral upper extremity with good sensation, bilateral lower extremities with a stocking-like sensation changes.  Reflexes are 3+ at the patellas and Achilles, 3-4 beats of clonus on the right 2 beats of clonus on the left upgoing toes on the left, mute on the right.  Upper extremity reflexes 2+ without Hoffman's  Clonus is not present.  Toes are down-going.    Imaging: MR LUMBAR SPINE WO CONTRAST Result Date: 01/19/2024 CLINICAL DATA:  Provided history: Low back pain, cauda equina  syndrome suspected. Ataxia, thoracic trauma. Additional history provided: The patient reports worsening bilateral leg numbness with leg pain and swelling. EXAM: MRI THORACIC AND LUMBAR SPINE WITHOUT CONTRAST TECHNIQUE: Multiplanar and multiecho pulse sequences of the thoracic and lumbar spine were obtained without intravenous contrast. COMPARISON:  Chest CT 01/19/2024. Lumbar spine radiographs 10/23/2013. FINDINGS: MRI THORACIC SPINE FINDINGS Mild intermittent motion degradation. Within this limitation, findings are as follows. Alignment: Dextrocurvature of the thoracic spine. No significant spondylolisthesis. Vertebrae: No thoracic vertebral compression fracture. No significant marrow edema or focal worrisome marrow lesion. Cord:   Multilevel spinal cord signal abnormality as described below. Paraspinal and other soft tissues: Please see same day chest CT for a description of intrathoracic findings. No paraspinal mass or collection. Disc levels: No more than mild disc degeneration within the thoracic spine. Developmentally narrow thoracic spinal canal due to short pedicles. T1-T2: Facet arthropathy and spurring. Ligamentum flavum hypertrophy. Mild-to-moderate spinal canal stenosis. Bilateral neural foraminal narrowing. T2-T3: Facet arthropathy and spurring. Ligamentum flavum hypertrophy and calcification. Severe spinal canal stenosis with spinal cord impingement. T2 hyperintense signal abnormality within the spinal cord compatible with myelomalacia and/or edema. Bilateral neural foraminal narrowing. T3-T4: Facet arthropathy and spurring. Ligamentum flavum hypertrophy and calcification. Severe spinal canal stenosis with spinal cord impingement. T2 hyperintense signal abnormality within the spinal cord compatible with myelomalacia and/or edema. Bilateral neural foraminal narrowing. T4-T5: Facet arthropathy and spurring. Ligamentum flavum hypertrophy and calcification. Severe spinal canal stenosis with spinal cord flattening. No definite signal abnormality identified within the spinal cord at this level. Bilateral neural foraminal narrowing. T5-T6: Posterior disc osteophyte complex to the left. Facet arthropathy and spurring. Ligamentum flavum hypertrophy. The posterior disc osteophyte complex effaces the ventral thecal sac on the left, flattening the ventral aspect of the spinal cord. However, the dorsal CSF space is maintained within the spinal canal. Small focus of T2 hyperintense signal abnormality within the left aspect of the spinal cord immediately above the disc space level, which may reflect myelomalacia or focal edema (series 20, image 16). Bilateral neural foraminal narrowing. T6-T7: Slight disc bulge. Facet arthropathy and spurring.  Mild degenerative spinal canal narrowing. Bilateral neural foraminal narrowing. T7-T8: Slight disc bulge. Facet arthropathy and spurring. Ligamentum flavum hypertrophy. Mild degenerative spinal canal stenosis. Bilateral neural foraminal narrowing. T8-T9: Slight disc bulge. Facet arthropathy and spurring. Ligamentum flavum hypertrophy and calcification. Mild degenerative spinal canal narrowing. Bilateral neural foraminal narrowing. T9-T10: Facet arthropathy and spurring. Ligamentum flavum hypertrophy and calcification. Severe spinal canal stenosis with spinal cord impingement. No definite spinal cord signal abnormality identified at this level. Bilateral neural foraminal narrowing (greater on the left). T10-T11: Facet arthropathy and spurring. Ligamentum flavum hypertrophy and calcification. Severe spinal canal stenosis with spinal cord impingement. T2 hyperintense spinal cord signal abnormality questioned at this site, which may reflect myelomalacia and/or edema. Bilateral neural foraminal narrowing (greater on the right). T11-T12: Facet arthropathy and spurring. Ligamentum flavum hypertrophy and calcification. Severe spinal canal stenosis with spinal cord flattening. No definite spinal cord signal abnormality identified at this level. MRI LUMBAR SPINE FINDINGS Segmentation: 5 lumbar vertebrae. The caudal most well-formed intervertebral disc space is designated L5-S1. Alignment:  No significant spondylolisthesis. Vertebrae: No lumbar vertebral compression fracture. No significant marrow edema or focal worrisome marrow lesion. Conus medullaris and cauda equina: Conus extends to the L1-L2 level. No signal abnormality identified within the visualized distal spinal cord. Paraspinal and other soft tissues: No abnormality identified within included portions of the abdomen/retroperitoneum. No paraspinal mass or collection. Disc levels: Mild disc degeneration  at L5-S1. Developmentally narrow lumbar spinal canal due to short  pedicles. T12-L1: Mild facet arthropathy and ligamentum flavum hypertrophy. Mild degenerative spinal canal narrowing. No significant foraminal stenosis. L1-L2: Mild facet arthropathy. No significant disc herniation or degenerative stenosis. L2-L3: Disc bulge with endplate spurring. Mild facet arthropathy. No significant disc herniation or degenerative spinal canal stenosis. Mild relative bilateral neural foraminal narrowing. L3-L4: Disc bulge with endplate spurring. Mild facet arthropathy mild degenerative spinal canal narrowing. Bilateral neural foraminal narrowing (mild right, mild-to-moderate left). L4-L5: Disc bulge with endplate spurring. Facet hypertrophy. No significant degenerative spinal canal stenosis. Bilateral neural foraminal narrowing (moderate right, mild-to-moderate left). L5-S1: Disc bulge with endplate spurring. Superimposed small central disc protrusion eccentric to the left (at site of posterior annular fissure). Mild facet arthropathy on the right. The disc bulge mildly effaces the ventral thecal sac with mild left subarticular narrowing and posterior displacement of the descending left S1 nerve root (series 19, image 28). No significant degenerative central canal stenosis. Moderate-to-severe bilateral neural foraminal narrowing. Thoracic spine MRI impressions #1 and #2 called by telephone at the time of interpretation on 01/19/2024 at 4:17 pm to provider Dr. Larinda Buttery, who verbally acknowledged these results. IMPRESSION: Thoracic spine: 1. Mildly motion degraded exam. 2. 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 3. Most notably, there is severe spinal canal stenosis at the T2-T3, T3-T4, T4-T5, T9-T10, T10-T11 and T11-T12 levels with spinal cord flattening/impingement. Signal abnormality within the spinal cord at the T2-T3, T3-T4 and possibly T10-T11 levels compatible with myelomalacia and/or focal edema. 4. At  T5-T6, a posterior disc osteophyte complex to the left effaces the ventral thecal sac and flattens the ventral aspect of the spinal cord. However, the dorsal CSF space is maintained within the spinal canal. Subtle signal abnormality within the left aspect of the spinal cord immediately above the T5-T6 disc space level, which may reflect myelomalacia and/or focal edema. 5. Sites of lesser spinal canal stenosis as described. 6. Multilevel foraminal stenosis. 7. Dextrocurvature of the thoracic spine. Lumbar spine: 1. Lumbar spondylosis superimposed upon a congenitally narrow lumbar spinal canal as outlined within the body of the report. 2. At L5-S1, a disc protrusion mildly effaces the ventral thecal sac with slight posterior displacement of the descending left S1 nerve root. 3. No more than mild degenerative spinal canal narrowing at the remaining lumbar levels. 4. Multilevel foraminal stenosis, greatest bilaterally at L5-S1 (moderate-to-severe at this level). Electronically Signed   By: Jackey Loge D.O.   On: 01/19/2024 16:19   MR THORACIC SPINE WO CONTRAST Result Date: 01/19/2024 CLINICAL DATA:  Provided history: Low back pain, cauda equina syndrome suspected. Ataxia, thoracic trauma. Additional history provided: The patient reports worsening bilateral leg numbness with leg pain and swelling. EXAM: MRI THORACIC AND LUMBAR SPINE WITHOUT CONTRAST TECHNIQUE: Multiplanar and multiecho pulse sequences of the thoracic and lumbar spine were obtained without intravenous contrast. COMPARISON:  Chest CT 01/19/2024. Lumbar spine radiographs 10/23/2013. FINDINGS: MRI THORACIC SPINE FINDINGS Mild intermittent motion degradation. Within this limitation, findings are as follows. Alignment: Dextrocurvature of the thoracic spine. No significant spondylolisthesis. Vertebrae: No thoracic vertebral compression fracture. No significant marrow edema or focal worrisome marrow lesion. Cord:  Multilevel spinal cord signal abnormality as  described below. Paraspinal and other soft tissues: Please see same day chest CT for a description of intrathoracic findings. No paraspinal mass or collection. Disc levels: No more than mild disc degeneration within the thoracic spine. Developmentally narrow thoracic  spinal canal due to short pedicles. T1-T2: Facet arthropathy and spurring. Ligamentum flavum hypertrophy. Mild-to-moderate spinal canal stenosis. Bilateral neural foraminal narrowing. T2-T3: Facet arthropathy and spurring. Ligamentum flavum hypertrophy and calcification. Severe spinal canal stenosis with spinal cord impingement. T2 hyperintense signal abnormality within the spinal cord compatible with myelomalacia and/or edema. Bilateral neural foraminal narrowing. T3-T4: Facet arthropathy and spurring. Ligamentum flavum hypertrophy and calcification. Severe spinal canal stenosis with spinal cord impingement. T2 hyperintense signal abnormality within the spinal cord compatible with myelomalacia and/or edema. Bilateral neural foraminal narrowing. T4-T5: Facet arthropathy and spurring. Ligamentum flavum hypertrophy and calcification. Severe spinal canal stenosis with spinal cord flattening. No definite signal abnormality identified within the spinal cord at this level. Bilateral neural foraminal narrowing. T5-T6: Posterior disc osteophyte complex to the left. Facet arthropathy and spurring. Ligamentum flavum hypertrophy. The posterior disc osteophyte complex effaces the ventral thecal sac on the left, flattening the ventral aspect of the spinal cord. However, the dorsal CSF space is maintained within the spinal canal. Small focus of T2 hyperintense signal abnormality within the left aspect of the spinal cord immediately above the disc space level, which may reflect myelomalacia or focal edema (series 20, image 16). Bilateral neural foraminal narrowing. T6-T7: Slight disc bulge. Facet arthropathy and spurring. Mild degenerative spinal canal narrowing.  Bilateral neural foraminal narrowing. T7-T8: Slight disc bulge. Facet arthropathy and spurring. Ligamentum flavum hypertrophy. Mild degenerative spinal canal stenosis. Bilateral neural foraminal narrowing. T8-T9: Slight disc bulge. Facet arthropathy and spurring. Ligamentum flavum hypertrophy and calcification. Mild degenerative spinal canal narrowing. Bilateral neural foraminal narrowing. T9-T10: Facet arthropathy and spurring. Ligamentum flavum hypertrophy and calcification. Severe spinal canal stenosis with spinal cord impingement. No definite spinal cord signal abnormality identified at this level. Bilateral neural foraminal narrowing (greater on the left). T10-T11: Facet arthropathy and spurring. Ligamentum flavum hypertrophy and calcification. Severe spinal canal stenosis with spinal cord impingement. T2 hyperintense spinal cord signal abnormality questioned at this site, which may reflect myelomalacia and/or edema. Bilateral neural foraminal narrowing (greater on the right). T11-T12: Facet arthropathy and spurring. Ligamentum flavum hypertrophy and calcification. Severe spinal canal stenosis with spinal cord flattening. No definite spinal cord signal abnormality identified at this level. MRI LUMBAR SPINE FINDINGS Segmentation: 5 lumbar vertebrae. The caudal most well-formed intervertebral disc space is designated L5-S1. Alignment:  No significant spondylolisthesis. Vertebrae: No lumbar vertebral compression fracture. No significant marrow edema or focal worrisome marrow lesion. Conus medullaris and cauda equina: Conus extends to the L1-L2 level. No signal abnormality identified within the visualized distal spinal cord. Paraspinal and other soft tissues: No abnormality identified within included portions of the abdomen/retroperitoneum. No paraspinal mass or collection. Disc levels: Mild disc degeneration at L5-S1. Developmentally narrow lumbar spinal canal due to short pedicles. T12-L1: Mild facet arthropathy  and ligamentum flavum hypertrophy. Mild degenerative spinal canal narrowing. No significant foraminal stenosis. L1-L2: Mild facet arthropathy. No significant disc herniation or degenerative stenosis. L2-L3: Disc bulge with endplate spurring. Mild facet arthropathy. No significant disc herniation or degenerative spinal canal stenosis. Mild relative bilateral neural foraminal narrowing. L3-L4: Disc bulge with endplate spurring. Mild facet arthropathy mild degenerative spinal canal narrowing. Bilateral neural foraminal narrowing (mild right, mild-to-moderate left). L4-L5: Disc bulge with endplate spurring. Facet hypertrophy. No significant degenerative spinal canal stenosis. Bilateral neural foraminal narrowing (moderate right, mild-to-moderate left). L5-S1: Disc bulge with endplate spurring. Superimposed small central disc protrusion eccentric to the left (at site of posterior annular fissure). Mild facet arthropathy on the right. The disc bulge mildly effaces the ventral  thecal sac with mild left subarticular narrowing and posterior displacement of the descending left S1 nerve root (series 19, image 28). No significant degenerative central canal stenosis. Moderate-to-severe bilateral neural foraminal narrowing. Thoracic spine MRI impressions #1 and #2 called by telephone at the time of interpretation on 01/19/2024 at 4:17 pm to provider Dr. Larinda Buttery, who verbally acknowledged these results. IMPRESSION: Thoracic spine: 1. Mildly motion degraded exam. 2. 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 3. Most notably, there is severe spinal canal stenosis at the T2-T3, T3-T4, T4-T5, T9-T10, T10-T11 and T11-T12 levels with spinal cord flattening/impingement. Signal abnormality within the spinal cord at the T2-T3, T3-T4 and possibly T10-T11 levels compatible with myelomalacia and/or focal edema. 4. At T5-T6, a posterior disc osteophyte complex to  the left effaces the ventral thecal sac and flattens the ventral aspect of the spinal cord. However, the dorsal CSF space is maintained within the spinal canal. Subtle signal abnormality within the left aspect of the spinal cord immediately above the T5-T6 disc space level, which may reflect myelomalacia and/or focal edema. 5. Sites of lesser spinal canal stenosis as described. 6. Multilevel foraminal stenosis. 7. Dextrocurvature of the thoracic spine. Lumbar spine: 1. Lumbar spondylosis superimposed upon a congenitally narrow lumbar spinal canal as outlined within the body of the report. 2. At L5-S1, a disc protrusion mildly effaces the ventral thecal sac with slight posterior displacement of the descending left S1 nerve root. 3. No more than mild degenerative spinal canal narrowing at the remaining lumbar levels. 4. Multilevel foraminal stenosis, greatest bilaterally at L5-S1 (moderate-to-severe at this level). Electronically Signed   By: Jackey Loge D.O.   On: 01/19/2024 16:19   CT Angio Chest PE W and/or Wo Contrast Result Date: 01/19/2024 CLINICAL DATA:  Bilateral leg numbness, pain, swelling, shortness of breath, dizziness, history of DVT/PE EXAM: CT ANGIOGRAPHY CHEST WITH CONTRAST TECHNIQUE: Multidetector CT imaging of the chest was performed using the standard protocol during bolus administration of intravenous contrast. Multiplanar CT image reconstructions and MIPs were obtained to evaluate the vascular anatomy. RADIATION DOSE REDUCTION: This exam was performed according to the departmental dose-optimization program which includes automated exposure control, adjustment of the mA and/or kV according to patient size and/or use of iterative reconstruction technique. CONTRAST:  OMNIPAQUE IOHEXOL 350 MG/ML SOLN COMPARISON:  12/30/2022 FINDINGS: Cardiovascular: Heart size normal. No pericardial effusion. The RV is nondilated. Satisfactory opacification of pulmonary arteries noted, and there is no  evidence of pulmonary emboli. Adequate contrast opacification of the thoracic aorta with no evidence of dissection, aneurysm, or stenosis. There is classic 3-vessel brachiocephalic arch anatomy without proximal stenosis. Mediastinum/Nodes: No mediastinal hematoma, mass, or adenopathy. Lungs/Pleura: No pleural effusion. No pneumothorax. Small blebs in the posterior left upper lobe. Lungs otherwise clear. Upper Abdomen: No acute findings. Musculoskeletal: No chest wall abnormality. No acute or significant osseous findings. Review of the MIP images confirms the above findings. IMPRESSION: 1. Negative for acute PE or thoracic aortic dissection. 2. No acute findings. Electronically Signed   By: Corlis Leak M.D.   On: 01/19/2024 15:38   US Venous Img Lower Bilateral Result Date: 01/19/2024 CLINICAL DATA:  Leg swelling, shortness of breath, pain EXAM: BILATERAL LOWER EXTREMITY VENOUS DOPPLER ULTRASOUND TECHNIQUE: Gray-scale sonography with compression, as well as color and duplex ultrasound, were performed to evaluate the deep venous system(s) from the level of the common femoral vein through the popliteal and proximal calf veins. COMPARISON:  12/30/2022  FINDINGS: VENOUS Normal compressibility of the common femoral, superficial femoral, and popliteal veins, as well as the visualized calf veins. Visualized portions of profunda femoral vein and great saphenous vein unremarkable. No filling defects to suggest DVT on grayscale or color Doppler imaging. Doppler waveforms show normal direction of venous flow, normal respiratory plasticity and response to augmentation. OTHER None. Limitations: none IMPRESSION: Negative. Electronically Signed   By: Corlis Leak M.D.   On: 01/19/2024 15:34   DG Chest 2 View Result Date: 01/19/2024 CLINICAL DATA:  SOB EXAM: CHEST - 2 VIEW COMPARISON:  12/30/2022 FINDINGS: Increase in perihilar interstitial prominence. Stable ill-defined left retrocardiac opacity. Heart size and mediastinal  contours are within normal limits. No effusion. Visualized bones unremarkable. IMPRESSION: Increase in perihilar interstitial prominence. Electronically Signed   By: Corlis Leak M.D.   On: 01/19/2024 15:33     I have personally reviewed the images and agree with the above interpretation.  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  This causes T2 signal change  Labs:    Latest Ref Rng & Units 01/19/2024   12:00 PM 12/30/2022   12:44 PM 12/30/2022    1:52 AM  CBC  WBC 4.0 - 10.5 K/uL 12.3  10.2  11.5   Hemoglobin 13.0 - 17.0 g/dL 16.1  09.6  04.5   Hematocrit 39.0 - 52.0 % 47.1  44.9  43.7   Platelets 150 - 400 K/uL 295  293  261       Latest Ref Rng & Units 01/19/2024   12:00 PM 12/30/2022   12:44 PM 12/30/2022    1:52 AM  BMP  Glucose 70 - 99 mg/dL 409  811  914   BUN 6 - 20 mg/dL 13  12  17    Creatinine 0.61 - 1.24 mg/dL 7.82  9.56  2.13   Sodium 135 - 145 mmol/L 138  139  139   Potassium 3.5 - 5.1 mmol/L 3.9  4.2  3.8   Chloride 98 - 111 mmol/L 103  103  106   CO2 22 - 32 mmol/L 29  24  25    Calcium 8.9 - 10.3 mg/dL 9.1  8.5  8.4         Assessment and Plan: Dalton Pena is a pleasant 43 y.o. male with presentation of bilateral lower extremity numbness and tingling.  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  This been going on since approximately December or January of this year.  He feels like his walking has been slowly but steadily worsening.  He is not having any new bowel or bladder symptoms.  He does state that he gets intermittent weakness of his bilateral lower extremities.  He does have numbness and tingling as well.  On physical  examination he is hyperreflexic in the right lower extremity and left lower extremity, he has multiple beats of clonus on the right.  On the left he has an upgoing toe.  He is on blood thinners for a DVT history.  I would like to see him on an urgent basis in clinic to evaluate and discuss further plans going forward.  He will need medical clearance and evaluation for his DVT history prior to any surgical intervention.  Since  this is been going on for approximately 4 months it does not need to be done emergently, and would rather have him medically optimized prior to surgery to help with complication avoidance and minimization.  Will see him in clinic within the next few days and will plan to discuss surgical optimization decompression possible fusion based off of the levels indicated.   Carroll Clamp, MD/MSCR Dept. of Neurosurgery

## 2024-01-19 NOTE — ED Notes (Signed)
 Updated MRI imminent

## 2024-01-19 NOTE — ED Provider Notes (Signed)
 [facility] Provider Note    Event Date/Time   First MD Initiated Contact with Patient 01/19/24 1223     (approximate)   History   Numbness (Bilateral legs)   HPI  Dalton Pena is a 43 y.o. 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  Patient reports that he has been off of his Eliquis for about a week given he ran out.  He reports increasing pain in his right leg.  He noticed this for a few months.  He also reports some increasing shortness of breath with exertion.  He reports back in December he fell and hurt his back and since then he has had intermittent numbness on his legs.  He reports that his bilateral leg.  He states it is hard to explain if it is the whole leg or just a dermatome.  He denies any weakness in the leg.  Denies any saddle anesthesia.  I reviewed the note from hematology on 07/01/2023 .  Where they wanted patient to remain  on rivaroxaban 20 mg daily   Physical Exam   Triage Vital Signs: ED Triage Vitals [01/19/24 1204]  Encounter Vitals Group     BP (!) 144/113     Systolic BP Percentile      Diastolic BP Percentile      Pulse Rate 69     Resp 20     Temp 98.1 F (36.7 C)     Temp Source Oral     SpO2 98 %     Weight      Height      Head Circumference      Peak Flow      Pain Score 7     Pain Loc      Pain Education      Exclude from Growth Chart     Most recent vital signs: Vitals:   01/19/24 1204  BP: (!) 144/113  Pulse: 69  Resp: 20  Temp: 98.1 F (36.7 C)  SpO2: 98%     General: Awake, no distress.  CV:  Good peripheral perfusion.  Resp:  Normal effort.  Abd:  No distention.  Other:  Patient reports sensation changes on bilateral legs.  Equal strength bilaterally able to flex and extend the ankles. Good pulses bilaterally. Warm and well perfused.    ED Results / Procedures / Treatments   Labs (all labs ordered are listed, but only abnormal results  are displayed) Labs Reviewed  CBC - Abnormal; Notable for the following components:      Result Value   WBC 12.3 (*)    All other components within normal limits  BASIC METABOLIC PANEL WITH GFR     EKG  My interpretation of EKG:  Normal sinus rhythm 62 without any ST elevations, T wave inversion in lead III, normal intervals  RADIOLOGY I have reviewed the xray personally and interpreted no evidence of any pneumonia   PROCEDURES:  Critical Care performed: No  Procedures   MEDICATIONS ORDERED IN ED: Medications - No data to display   IMPRESSION / MDM / ASSESSMENT AND PLAN / ED COURSE  I reviewed the triage vital signs and the nursing notes.   Patient's presentation is most consistent with acute presentation with potential threat to life or bodily function.   Patient comes in with leg swelling, shortness of breath in the setting of being off of his Xarelto.  Will get workup  for PE, DVT.  Patient also reports some low back pain with numbness in bilateral legs.  This does not seem consistent with stroke.  Denies any falls or hitting his head to suggest intracranial hemorrhage.  Will get MRI thoracic, lumbar to evaluate for any cord compression.  BMP shows normal creatinine.  Troponin is slightly elevated.  Hepatic function normal CBC shows elevated white count.  BNP is normal  Patient added off to oncoming team pending CT imaging, MRI   The patient is on the cardiac monitor to evaluate for evidence of arrhythmia and/or significant heart rate changes.      FINAL CLINICAL IMPRESSION(S) / ED DIAGNOSES   Final diagnoses:  Shortness of breath     Rx / DC Orders   ED Discharge Orders     None        Note:  This document was prepared using Dragon voice recognition software and may include unintentional dictation errors.   Lubertha Rush, MD 01/19/24 1535

## 2024-01-19 NOTE — ED Notes (Signed)
 Pt alert, NAD, calm, interactive, resps e/u, speaking in clear complete sentences. EDP at [facility] speaking with pt, pt participatory.

## 2024-01-19 NOTE — ED Notes (Signed)
 Back from CT, ambulatory to b/r, steady gait, denies sx or complaints at this time. Family at [facility].

## 2024-01-19 NOTE — ED Notes (Signed)
 To MRI by w/c, no changes, alert, NAD, calm, interactive.

## 2024-01-19 NOTE — ED Notes (Signed)
 Back from MRI, pending CT, CT notified.

## 2024-01-19 NOTE — ED Notes (Signed)
 To MRI

## 2024-01-19 NOTE — ED Notes (Signed)
 Alert, NAD, calm, interactive, resps e/u, speaking in clear complete sentences, skin W&D. Updated. States, "doing alright".

## 2024-01-19 NOTE — ED Notes (Addendum)
 Pt not in h/w, taken to doppler US  DVT study (possibly), not in MRI, or CT.

## 2024-01-19 NOTE — ED Triage Notes (Addendum)
 Pt to ED via POV from home. Pt report bilateral leg numbness that has been present "for awhile" but is getting worse. Pt also reports legs pain and increased leg swelling. Pt also endorses SOB, dizziness and loss of balance. Pt denies CP, vision changes or HA. Pt is on blood thinner for hx of DVT and PE. Denies hx of DM or CHF

## 2024-01-19 NOTE — ED Notes (Signed)
 Back from xray

## 2024-01-19 NOTE — ED Notes (Signed)
 Sleeping, sonorous resps. Family at [facility]. NAD, calm, arousable to voice. Obtaining repeat trop.

## 2024-01-20 ENCOUNTER — Telehealth: Payer: Self-pay

## 2024-01-20 NOTE — Telephone Encounter (Signed)
 Per, Dr. Felipe Horton patient should be seen on Monday for ED follow up for Thoracic Myelopathy. Patient is coming in for a follow up visit 2:45pm for outpatient follow up.

## 2024-01-25 ENCOUNTER — Ambulatory Visit (INDEPENDENT_AMBULATORY_CARE_PROVIDER_SITE_OTHER): Admitting: Neurosurgery

## 2024-01-25 ENCOUNTER — Encounter: Payer: Self-pay | Admitting: Neurosurgery

## 2024-01-25 VITALS — BP 128/80 | Ht 65.0 in | Wt 307.0 lb

## 2024-01-25 DIAGNOSIS — M4714 Other spondylosis with myelopathy, thoracic region: Secondary | ICD-10-CM | POA: Diagnosis not present

## 2024-01-26 ENCOUNTER — Encounter: Payer: Self-pay | Admitting: Neurosurgery

## 2024-01-26 NOTE — Telephone Encounter (Signed)
 Patient's wife called to let our office know that he would also like to begin the process of scheduling surgery.

## 2024-01-26 NOTE — Progress Notes (Signed)
 Primary Physician:  Pcp, No   Chief Complaint:    History of Present Illness: 01/25/2024 Dalton Pena is a 43 y.o. male who presents with the chief complaint of abdominal pain and lower extremity tingling.  He states that over the past 4 months he has had difficulty with walking and progressive numbness in his legs.  He states that his legs feel like they are always swollen and tingling.  He states that if he stands up for long periods of time he gets weakness in his bilateral lower extremities.  This has been slowly progressing over the past 4 months.  He does not have any bowel or bladder difficulties.  He does have trouble walking which is getting worse with increased stumbling.  He does have intermittent numbness and tingling in his legs, this has been slowly ascending from his feet upwards.  He states that his strength has not stayed weak and that he does better when he is resting.  He has not had any physical therapy.  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  He is currently on blood thinners     The symptoms are causing a significant impact on the patient's life.    Review of Systems:  A 10 point review of systems is negative, except for the pertinent positives and negatives detailed in the HPI.   Past Medical History:     Past Medical History:  Diagnosis Date   Deep vein thrombosis (DVT) (HCC)     Pulmonary embolism (HCC)            Past Surgical History: History reviewed. No pertinent surgical history.       Allergies:    Allergies as of 01/19/2024   (No Known Allergies)      Medications: Patient states he is on blood thinners for his DVT history.     Social History: Social History  Social History         Tobacco Use   Smoking status: Every Day      Current packs/day: 0.50      Types: Cigarettes   Smokeless tobacco: Never  Vaping Use   Vaping status: Every Day  Substance Use Topics   Alcohol use: Yes      Comment: occ.   Drug use: Yes       Types: Marijuana        Family Medical History:      Family History  Problem Relation Age of Onset   Heart failure Father            Physical Examination:    Vitals:    01/19/24 1204  BP: (!) 144/113  Pulse: 69  Resp: 20  Temp: 98.1 F (36.7 C)  SpO2: 98%        General:Patient is well developed, well nourished, calm, collected, and in no apparent distress.   NEUROLOGICAL:  General: In no acute distress.   Awake, alert, oriented to person, place, and time.  Pupils equal round and reactive to light.  Facial tone is symmetric.  Tongue protrusion is midline.  There is no pronator drift.   Strength: Side Iliopsoas Quads Hamstring PF DF EHL  R 5 5 5 5  4+ 4  L 5 5 5 5  4+ 4      Bilateral upper extremity with good sensation, bilateral lower extremities with a stocking-like sensation changes.   Reflexes are 3+ at the patellas and Achilles, 3-4 beats of clonus on the right 2 beats  of clonus on the left upgoing toes on the left, mute on the right.  Upper extremity reflexes 2+ without Hoffman's   Clonus is not present.  Toes are down-going.     Imaging:  Imaging Results (Last 48 hours)  MR LUMBAR SPINE WO CONTRAST Result Date: 01/19/2024 CLINICAL DATA:  Provided history: Low back pain, cauda equina syndrome suspected. Ataxia, thoracic trauma. Additional history provided: The patient reports worsening bilateral leg numbness with leg pain and swelling. EXAM: MRI THORACIC AND LUMBAR SPINE WITHOUT CONTRAST TECHNIQUE: Multiplanar and multiecho pulse sequences of the thoracic and lumbar spine were obtained without intravenous contrast. COMPARISON:  Chest CT 01/19/2024. Lumbar spine radiographs 10/23/2013. FINDINGS: MRI THORACIC SPINE FINDINGS Mild intermittent motion degradation. Within this limitation, findings are as follows. Alignment: Dextrocurvature of the thoracic spine. No significant spondylolisthesis. Vertebrae: No thoracic vertebral compression fracture. No significant  marrow edema or focal worrisome marrow lesion. Cord:  Multilevel spinal cord signal abnormality as described below. Paraspinal and other soft tissues: Please see same day chest CT for a description of intrathoracic findings. No paraspinal mass or collection. Disc levels: No more than mild disc degeneration within the thoracic spine. Developmentally narrow thoracic spinal canal due to short pedicles. T1-T2: Facet arthropathy and spurring. Ligamentum flavum hypertrophy. Mild-to-moderate spinal canal stenosis. Bilateral neural foraminal narrowing. T2-T3: Facet arthropathy and spurring. Ligamentum flavum hypertrophy and calcification. Severe spinal canal stenosis with spinal cord impingement. T2 hyperintense signal abnormality within the spinal cord compatible with myelomalacia and/or edema. Bilateral neural foraminal narrowing. T3-T4: Facet arthropathy and spurring. Ligamentum flavum hypertrophy and calcification. Severe spinal canal stenosis with spinal cord impingement. T2 hyperintense signal abnormality within the spinal cord compatible with myelomalacia and/or edema. Bilateral neural foraminal narrowing. T4-T5: Facet arthropathy and spurring. Ligamentum flavum hypertrophy and calcification. Severe spinal canal stenosis with spinal cord flattening. No definite signal abnormality identified within the spinal cord at this level. Bilateral neural foraminal narrowing. T5-T6: Posterior disc osteophyte complex to the left. Facet arthropathy and spurring. Ligamentum flavum hypertrophy. The posterior disc osteophyte complex effaces the ventral thecal sac on the left, flattening the ventral aspect of the spinal cord. However, the dorsal CSF space is maintained within the spinal canal. Small focus of T2 hyperintense signal abnormality within the left aspect of the spinal cord immediately above the disc space level, which may reflect myelomalacia or focal edema (series 20, image 16). Bilateral neural foraminal narrowing.  T6-T7: Slight disc bulge. Facet arthropathy and spurring. Mild degenerative spinal canal narrowing. Bilateral neural foraminal narrowing. T7-T8: Slight disc bulge. Facet arthropathy and spurring. Ligamentum flavum hypertrophy. Mild degenerative spinal canal stenosis. Bilateral neural foraminal narrowing. T8-T9: Slight disc bulge. Facet arthropathy and spurring. Ligamentum flavum hypertrophy and calcification. Mild degenerative spinal canal narrowing. Bilateral neural foraminal narrowing. T9-T10: Facet arthropathy and spurring. Ligamentum flavum hypertrophy and calcification. Severe spinal canal stenosis with spinal cord impingement. No definite spinal cord signal abnormality identified at this level. Bilateral neural foraminal narrowing (greater on the left). T10-T11: Facet arthropathy and spurring. Ligamentum flavum hypertrophy and calcification. Severe spinal canal stenosis with spinal cord impingement. T2 hyperintense spinal cord signal abnormality questioned at this site, which may reflect myelomalacia and/or edema. Bilateral neural foraminal narrowing (greater on the right). T11-T12: Facet arthropathy and spurring. Ligamentum flavum hypertrophy and calcification. Severe spinal canal stenosis with spinal cord flattening. No definite spinal cord signal abnormality identified at this level. MRI LUMBAR SPINE FINDINGS Segmentation: 5 lumbar vertebrae. The caudal most well-formed intervertebral disc space is designated L5-S1. Alignment:  No significant  spondylolisthesis. Vertebrae: No lumbar vertebral compression fracture. No significant marrow edema or focal worrisome marrow lesion. Conus medullaris and cauda equina: Conus extends to the L1-L2 level. No signal abnormality identified within the visualized distal spinal cord. Paraspinal and other soft tissues: No abnormality identified within included portions of the abdomen/retroperitoneum. No paraspinal mass or collection. Disc levels: Mild disc degeneration at  L5-S1. Developmentally narrow lumbar spinal canal due to short pedicles. T12-L1: Mild facet arthropathy and ligamentum flavum hypertrophy. Mild degenerative spinal canal narrowing. No significant foraminal stenosis. L1-L2: Mild facet arthropathy. No significant disc herniation or degenerative stenosis. L2-L3: Disc bulge with endplate spurring. Mild facet arthropathy. No significant disc herniation or degenerative spinal canal stenosis. Mild relative bilateral neural foraminal narrowing. L3-L4: Disc bulge with endplate spurring. Mild facet arthropathy mild degenerative spinal canal narrowing. Bilateral neural foraminal narrowing (mild right, mild-to-moderate left). L4-L5: Disc bulge with endplate spurring. Facet hypertrophy. No significant degenerative spinal canal stenosis. Bilateral neural foraminal narrowing (moderate right, mild-to-moderate left). L5-S1: Disc bulge with endplate spurring. Superimposed small central disc protrusion eccentric to the left (at site of posterior annular fissure). Mild facet arthropathy on the right. The disc bulge mildly effaces the ventral thecal sac with mild left subarticular narrowing and posterior displacement of the descending left S1 nerve root (series 19, image 28). No significant degenerative central canal stenosis. Moderate-to-severe bilateral neural foraminal narrowing. Thoracic spine MRI impressions #1 and #2 called by telephone at the time of interpretation on 01/19/2024 at 4:17 pm to provider Dr. Cleora Daft, who verbally acknowledged these results. IMPRESSION: Thoracic spine: 1. Mildly motion degraded exam. 2. 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 3. Most notably, there is severe spinal canal stenosis at the T2-T3, T3-T4, T4-T5, T9-T10, T10-T11 and T11-T12 levels with spinal cord flattening/impingement. Signal abnormality within the spinal cord at the T2-T3, T3-T4 and possibly T10-T11  levels compatible with myelomalacia and/or focal edema. 4. At T5-T6, a posterior disc osteophyte complex to the left effaces the ventral thecal sac and flattens the ventral aspect of the spinal cord. However, the dorsal CSF space is maintained within the spinal canal. Subtle signal abnormality within the left aspect of the spinal cord immediately above the T5-T6 disc space level, which may reflect myelomalacia and/or focal edema. 5. Sites of lesser spinal canal stenosis as described. 6. Multilevel foraminal stenosis. 7. Dextrocurvature of the thoracic spine. Lumbar spine: 1. Lumbar spondylosis superimposed upon a congenitally narrow lumbar spinal canal as outlined within the body of the report. 2. At L5-S1, a disc protrusion mildly effaces the ventral thecal sac with slight posterior displacement of the descending left S1 nerve root. 3. No more than mild degenerative spinal canal narrowing at the remaining lumbar levels. 4. Multilevel foraminal stenosis, greatest bilaterally at L5-S1 (moderate-to-severe at this level). Electronically Signed   By: Bascom Lily D.O.   On: 01/19/2024 16:19    MR THORACIC SPINE WO CONTRAST Result Date: 01/19/2024 CLINICAL DATA:  Provided history: Low back pain, cauda equina syndrome suspected. Ataxia, thoracic trauma. Additional history provided: The patient reports worsening bilateral leg numbness with leg pain and swelling. EXAM: MRI THORACIC AND LUMBAR SPINE WITHOUT CONTRAST TECHNIQUE: Multiplanar and multiecho pulse sequences of the thoracic and lumbar spine were obtained without intravenous contrast. COMPARISON:  Chest CT 01/19/2024. Lumbar spine radiographs 10/23/2013. FINDINGS: MRI THORACIC SPINE FINDINGS Mild intermittent motion degradation. Within this limitation, findings are as follows. Alignment: Dextrocurvature of the thoracic spine. No significant spondylolisthesis. Vertebrae:  No thoracic vertebral compression fracture. No significant marrow edema or focal worrisome  marrow lesion. Cord:  Multilevel spinal cord signal abnormality as described below. Paraspinal and other soft tissues: Please see same day chest CT for a description of intrathoracic findings. No paraspinal mass or collection. Disc levels: No more than mild disc degeneration within the thoracic spine. Developmentally narrow thoracic spinal canal due to short pedicles. T1-T2: Facet arthropathy and spurring. Ligamentum flavum hypertrophy. Mild-to-moderate spinal canal stenosis. Bilateral neural foraminal narrowing. T2-T3: Facet arthropathy and spurring. Ligamentum flavum hypertrophy and calcification. Severe spinal canal stenosis with spinal cord impingement. T2 hyperintense signal abnormality within the spinal cord compatible with myelomalacia and/or edema. Bilateral neural foraminal narrowing. T3-T4: Facet arthropathy and spurring. Ligamentum flavum hypertrophy and calcification. Severe spinal canal stenosis with spinal cord impingement. T2 hyperintense signal abnormality within the spinal cord compatible with myelomalacia and/or edema. Bilateral neural foraminal narrowing. T4-T5: Facet arthropathy and spurring. Ligamentum flavum hypertrophy and calcification. Severe spinal canal stenosis with spinal cord flattening. No definite signal abnormality identified within the spinal cord at this level. Bilateral neural foraminal narrowing. T5-T6: Posterior disc osteophyte complex to the left. Facet arthropathy and spurring. Ligamentum flavum hypertrophy. The posterior disc osteophyte complex effaces the ventral thecal sac on the left, flattening the ventral aspect of the spinal cord. However, the dorsal CSF space is maintained within the spinal canal. Small focus of T2 hyperintense signal abnormality within the left aspect of the spinal cord immediately above the disc space level, which may reflect myelomalacia or focal edema (series 20, image 16). Bilateral neural foraminal narrowing. T6-T7: Slight disc bulge. Facet  arthropathy and spurring. Mild degenerative spinal canal narrowing. Bilateral neural foraminal narrowing. T7-T8: Slight disc bulge. Facet arthropathy and spurring. Ligamentum flavum hypertrophy. Mild degenerative spinal canal stenosis. Bilateral neural foraminal narrowing. T8-T9: Slight disc bulge. Facet arthropathy and spurring. Ligamentum flavum hypertrophy and calcification. Mild degenerative spinal canal narrowing. Bilateral neural foraminal narrowing. T9-T10: Facet arthropathy and spurring. Ligamentum flavum hypertrophy and calcification. Severe spinal canal stenosis with spinal cord impingement. No definite spinal cord signal abnormality identified at this level. Bilateral neural foraminal narrowing (greater on the left). T10-T11: Facet arthropathy and spurring. Ligamentum flavum hypertrophy and calcification. Severe spinal canal stenosis with spinal cord impingement. T2 hyperintense spinal cord signal abnormality questioned at this site, which may reflect myelomalacia and/or edema. Bilateral neural foraminal narrowing (greater on the right). T11-T12: Facet arthropathy and spurring. Ligamentum flavum hypertrophy and calcification. Severe spinal canal stenosis with spinal cord flattening. No definite spinal cord signal abnormality identified at this level. MRI LUMBAR SPINE FINDINGS Segmentation: 5 lumbar vertebrae. The caudal most well-formed intervertebral disc space is designated L5-S1. Alignment:  No significant spondylolisthesis. Vertebrae: No lumbar vertebral compression fracture. No significant marrow edema or focal worrisome marrow lesion. Conus medullaris and cauda equina: Conus extends to the L1-L2 level. No signal abnormality identified within the visualized distal spinal cord. Paraspinal and other soft tissues: No abnormality identified within included portions of the abdomen/retroperitoneum. No paraspinal mass or collection. Disc levels: Mild disc degeneration at L5-S1. Developmentally narrow lumbar  spinal canal due to short pedicles. T12-L1: Mild facet arthropathy and ligamentum flavum hypertrophy. Mild degenerative spinal canal narrowing. No significant foraminal stenosis. L1-L2: Mild facet arthropathy. No significant disc herniation or degenerative stenosis. L2-L3: Disc bulge with endplate spurring. Mild facet arthropathy. No significant disc herniation or degenerative spinal canal stenosis. Mild relative bilateral neural foraminal narrowing. L3-L4: Disc bulge with endplate spurring. Mild facet arthropathy mild degenerative spinal canal narrowing. Bilateral neural  foraminal narrowing (mild right, mild-to-moderate left). L4-L5: Disc bulge with endplate spurring. Facet hypertrophy. No significant degenerative spinal canal stenosis. Bilateral neural foraminal narrowing (moderate right, mild-to-moderate left). L5-S1: Disc bulge with endplate spurring. Superimposed small central disc protrusion eccentric to the left (at site of posterior annular fissure). Mild facet arthropathy on the right. The disc bulge mildly effaces the ventral thecal sac with mild left subarticular narrowing and posterior displacement of the descending left S1 nerve root (series 19, image 28). No significant degenerative central canal stenosis. Moderate-to-severe bilateral neural foraminal narrowing. Thoracic spine MRI impressions #1 and #2 called by telephone at the time of interpretation on 01/19/2024 at 4:17 pm to provider Dr. Cleora Daft, who verbally acknowledged these results. IMPRESSION: Thoracic spine: 1. Mildly motion degraded exam. 2. 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 3. Most notably, there is severe spinal canal stenosis at the T2-T3, T3-T4, T4-T5, T9-T10, T10-T11 and T11-T12 levels with spinal cord flattening/impingement. Signal abnormality within the spinal cord at the T2-T3, T3-T4 and possibly T10-T11 levels compatible with myelomalacia  and/or focal edema. 4. At T5-T6, a posterior disc osteophyte complex to the left effaces the ventral thecal sac and flattens the ventral aspect of the spinal cord. However, the dorsal CSF space is maintained within the spinal canal. Subtle signal abnormality within the left aspect of the spinal cord immediately above the T5-T6 disc space level, which may reflect myelomalacia and/or focal edema. 5. Sites of lesser spinal canal stenosis as described. 6. Multilevel foraminal stenosis. 7. Dextrocurvature of the thoracic spine. Lumbar spine: 1. Lumbar spondylosis superimposed upon a congenitally narrow lumbar spinal canal as outlined within the body of the report. 2. At L5-S1, a disc protrusion mildly effaces the ventral thecal sac with slight posterior displacement of the descending left S1 nerve root. 3. No more than mild degenerative spinal canal narrowing at the remaining lumbar levels. 4. Multilevel foraminal stenosis, greatest bilaterally at L5-S1 (moderate-to-severe at this level). Electronically Signed   By: Bascom Lily D.O.   On: 01/19/2024 16:19    CT Angio Chest PE W and/or Wo Contrast Result Date: 01/19/2024 CLINICAL DATA:  Bilateral leg numbness, pain, swelling, shortness of breath, dizziness, history of DVT/PE EXAM: CT ANGIOGRAPHY CHEST WITH CONTRAST TECHNIQUE: Multidetector CT imaging of the chest was performed using the standard protocol during bolus administration of intravenous contrast. Multiplanar CT image reconstructions and MIPs were obtained to evaluate the vascular anatomy. RADIATION DOSE REDUCTION: This exam was performed according to the departmental dose-optimization program which includes automated exposure control, adjustment of the mA and/or kV according to patient size and/or use of iterative reconstruction technique. CONTRAST:  OMNIPAQUE  IOHEXOL  350 MG/ML SOLN COMPARISON:  12/30/2022 FINDINGS: Cardiovascular: Heart size normal. No pericardial effusion. The RV is nondilated.  Satisfactory opacification of pulmonary arteries noted, and there is no evidence of pulmonary emboli. Adequate contrast opacification of the thoracic aorta with no evidence of dissection, aneurysm, or stenosis. There is classic 3-vessel brachiocephalic arch anatomy without proximal stenosis. Mediastinum/Nodes: No mediastinal hematoma, mass, or adenopathy. Lungs/Pleura: No pleural effusion. No pneumothorax. Small blebs in the posterior left upper lobe. Lungs otherwise clear. Upper Abdomen: No acute findings. Musculoskeletal: No chest wall abnormality. No acute or significant osseous findings. Review of the MIP images confirms the above findings. IMPRESSION: 1. Negative for acute PE or thoracic aortic dissection. 2. No acute findings. Electronically Signed   By: Nicoletta Barrier M.D.   On: 01/19/2024 15:38  US  Venous Img Lower Bilateral Result Date: 01/19/2024 CLINICAL DATA:  Leg swelling, shortness of breath, pain EXAM: BILATERAL LOWER EXTREMITY VENOUS DOPPLER ULTRASOUND TECHNIQUE: Gray-scale sonography with compression, as well as color and duplex ultrasound, were performed to evaluate the deep venous system(s) from the level of the common femoral vein through the popliteal and proximal calf veins. COMPARISON:  12/30/2022 FINDINGS: VENOUS Normal compressibility of the common femoral, superficial femoral, and popliteal veins, as well as the visualized calf veins. Visualized portions of profunda femoral vein and great saphenous vein unremarkable. No filling defects to suggest DVT on grayscale or color Doppler imaging. Doppler waveforms show normal direction of venous flow, normal respiratory plasticity and response to augmentation. OTHER None. Limitations: none IMPRESSION: Negative. Electronically Signed   By: Nicoletta Barrier M.D.   On: 01/19/2024 15:34    DG Chest 2 View Result Date: 01/19/2024 CLINICAL DATA:  SOB EXAM: CHEST - 2 VIEW COMPARISON:  12/30/2022 FINDINGS: Increase in perihilar interstitial prominence.  Stable ill-defined left retrocardiac opacity. Heart size and mediastinal contours are within normal limits. No effusion. Visualized bones unremarkable. IMPRESSION: Increase in perihilar interstitial prominence. Electronically Signed   By: Nicoletta Barrier M.D.   On: 01/19/2024 15:33       Labs:     Latest Ref Rng & Units 01/19/2024   12:00 PM 12/30/2022   12:44 PM 12/30/2022    1:52 AM  CBC  WBC 4.0 - 10.5 K/uL 12.3  10.2  11.5   Hemoglobin 13.0 - 17.0 g/dL 16.1  09.6  04.5   Hematocrit 39.0 - 52.0 % 47.1  44.9  43.7   Platelets 150 - 400 K/uL 295  293  261         Latest Ref Rng & Units 01/19/2024   12:00 PM 12/30/2022   12:44 PM 12/30/2022    1:52 AM  BMP  Glucose 70 - 99 mg/dL 409  811  914   BUN 6 - 20 mg/dL 13  12  17    Creatinine 0.61 - 1.24 mg/dL 7.82  9.56  2.13   Sodium 135 - 145 mmol/L 138  139  139   Potassium 3.5 - 5.1 mmol/L 3.9  4.2  3.8   Chloride 98 - 111 mmol/L 103  103  106   CO2 22 - 32 mmol/L 29  24  25    Calcium 8.9 - 10.3 mg/dL 9.1  8.5  8.4           Assessment and Plan: Mr. Fuson is a pleasant 43 y.o. male with presentation of bilateral lower extremity numbness and tingling.  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  This been going on since approximately December or January of this year.  I met him recently when he presented to the emergency department with the symptoms.  He was discharged home to, and follow-up in clinic so we can discuss this further.  Thankfully he is not having any bowel or bladder symptoms.  He is having worsening stumbling and unsteadiness on his feet.  His exam does show progressive myelopathy, his weakness is slight in the lower extremities however it is progressive from even a week ago when I saw him last in the emergency department.  He shows signs of compressive myelopathy with hyper reflexia and difficulty with ambulation.  He has a wide-based gait at this point.  He is on blood thinners for a DVT history.  He will need  medical clearance and evaluation for his DVT history prior to  any surgical intervention.  His stenosis is severe at 2 separate sections of his thoracic spine, he has T2 signal change noted at these 2 sections which are likely causing a double crush type phenomenon and are both contributing separately to his progressive thoracic myelopathy.  I would plan on a T1-T5 posterior decompression and fusion with stereotactic guidance, and a separate T9-T12 posterior decompression and fusion.  He has a congenitally stenotic spine, however these are the levels in which there stenosis is most severe in which he has excess calcification of his ligaments.  Given the wide decompression needed to decompress his ligaments and his medial facets, I believe he will need a posterior spinal instrumentation/fusion for stabilization.  This was discussed in detail with him, we discussed the risks of surgery which are severe including spinal cord injury, paralysis, weakness numbness tingling, CSF leak, need for further surgery, hardware failure, and also given his medical history medical complications such as cardiac pulmonary vascular and hematologic among others.  I would like to have him optimized for surgery with evaluation by medicine for ability to have him off of his anticoagulation and whether or not he needs a DVT filter.    Carroll Clamp, MD/MSCR Dept. of Neurosurgery

## 2024-01-27 NOTE — Telephone Encounter (Signed)
 Need completed note and posting sheet when you have a chance please

## 2024-01-28 ENCOUNTER — Other Ambulatory Visit: Payer: Self-pay

## 2024-01-28 ENCOUNTER — Encounter: Payer: Self-pay | Admitting: Emergency Medicine

## 2024-01-28 ENCOUNTER — Emergency Department
Admission: EM | Admit: 2024-01-28 | Discharge: 2024-01-28 | Disposition: A | Discharge status: Home / Self Care | Attending: Emergency Medicine | Admitting: Emergency Medicine

## 2024-01-28 ENCOUNTER — Telehealth: Payer: Self-pay | Admitting: Neurosurgery

## 2024-01-28 DIAGNOSIS — M4714 Other spondylosis with myelopathy, thoracic region: Secondary | ICD-10-CM | POA: Diagnosis not present

## 2024-01-28 DIAGNOSIS — Z720 Tobacco use: Secondary | ICD-10-CM | POA: Diagnosis not present

## 2024-01-28 DIAGNOSIS — Z7951 Long term (current) use of inhaled steroids: Secondary | ICD-10-CM | POA: Insufficient documentation

## 2024-01-28 DIAGNOSIS — Z7901 Long term (current) use of anticoagulants: Secondary | ICD-10-CM | POA: Insufficient documentation

## 2024-01-28 DIAGNOSIS — J449 Chronic obstructive pulmonary disease, unspecified: Secondary | ICD-10-CM | POA: Insufficient documentation

## 2024-01-28 DIAGNOSIS — R200 Anesthesia of skin: Secondary | ICD-10-CM | POA: Diagnosis present

## 2024-01-28 MED ORDER — OXYCODONE HCL 5 MG PO TABS: 5.0000 mg | ORAL_TABLET | Freq: Four times a day (QID) | ORAL | 0 refills | Status: DC | PRN

## 2024-01-28 MED ORDER — METHOCARBAMOL 500 MG PO TABS
500.0000 mg | ORAL_TABLET | Freq: Once | ORAL | Status: DC
  Filled 2024-01-28: qty 1

## 2024-01-28 MED ORDER — OXYCODONE-ACETAMINOPHEN 7.5-325 MG PO TABS
1.0000 | ORAL_TABLET | Freq: Once | ORAL | Status: AC
  Administered 2024-01-28: 1 via ORAL
  Filled 2024-01-28: qty 1

## 2024-01-28 MED ORDER — METHOCARBAMOL 500 MG PO TABS: 500.0000 mg | ORAL_TABLET | Freq: Four times a day (QID) | ORAL | 0 refills | Status: DC | PRN

## 2024-01-28 NOTE — Telephone Encounter (Signed)
 I called patient's wife. They presented to the ER for evaluation for worsening symptoms as listed below.   They are asking when they can schedule surgery, they are frustrated that they have not been contacted yet in next steps.   I notified Dr. Felipe Horton and Estil Heman of patient presenting to the ER.

## 2024-01-28 NOTE — Discharge Instructions (Addendum)
 You have an appointment with Dr. Felipe Horton on Monday, 02/01/2024 at 2:30 PM.  Medication was sent to your pharmacy.  The muscle relaxant is every 6 hours and the oxycodone  is also every 6 hours.  The 2 medications together could cause drowsiness and increase your risk for injury.  Do not drive or operate machinery while taking these medications.  Return to the emergency department immediately if any sudden loss of bowel or bladder control.

## 2024-01-28 NOTE — Telephone Encounter (Signed)
 Patient sent letter taking him out of work effective 01/25/2024 until further notice. Once a date is established for surgery we will send another to his employer.

## 2024-01-28 NOTE — Telephone Encounter (Signed)
 Patient's wife is calling to let our office know that the patient is having numbness and tingling in his left leg and beginning last night he started having sharp pains from his neck down his left leg. She states that the pain is now constant and is an 8/10. She also states that he can barely walk on his left leg. She would like to know if he needs to go to the ER. Please advise.

## 2024-01-28 NOTE — ED Triage Notes (Signed)
 Pt to ED via POV. Pt states that he was recently diagnosed with spinal issues. Pt reports that last night he started having pain, numbness, and tingling in his left leg. Pt's called neurosurgery office and they told him to come to the ED.

## 2024-01-28 NOTE — ED Provider Notes (Addendum)
 [facility] Provider Note    Event Date/Time   First MD Initiated Contact with Patient 01/28/24 1002     (approximate)   History   Leg Pain   HPI  Dalton Pena is a 43 y.o. male   presents to the ED with wife with complaint of pain from his cervical spine down to his lumbar spine.  Patient was seen recently in the emergency department and has been following up with neurosurgery.  Patient reports that he did work yesterday and when he came home at 4:30 pm was unable to get comfortable.  Patient states about his working yesterday other than standing for long period time.  No over-the-counter medications have been taken.  Patient denies any incontinence of bowel or bladder.  He reports that his left leg has slightly more numbness than it has compared to yesterday.  Patient has history of DVT, recurrent PEs, respiratory failure with hypoxia, continued tobacco use, COPD.  He also continues to use his albuterol  inhaler and takes Xarelto .      Physical Exam   Triage Vital Signs: ED Triage Vitals  Encounter Vitals Group     BP 01/28/24 0930 (!) 156/103     Systolic BP Percentile --      Diastolic BP Percentile --      Pulse Rate 01/28/24 0930 67     Resp 01/28/24 0930 18     Temp 01/28/24 0930 98.3 F (36.8 C)     Temp Source 01/28/24 0930 Oral     SpO2 01/28/24 0930 96 %     Weight 01/28/24 0931 (!) 306 lb 7 oz (139 kg)     Height 01/28/24 0931 5\' 5"  (1.651 m)     Head Circumference --      Peak Flow --      Pain Score 01/28/24 0931 8     Pain Loc --      Pain Education --      Exclude from Growth Chart --     Most recent vital signs: Vitals:   01/28/24 0930  BP: (!) 156/103  Pulse: 67  Resp: 18  Temp: 98.3 F (36.8 C)  SpO2: 96%     General: Awake, no distress.  Alert, talkative, able to answer questions appropriately. CV:  Good peripheral perfusion.  Heart regular rate and rhythm. Resp:  Normal effort.  Lungs clear  bilaterally. Abd:  No distention.  Other:  Patient is able to lift lower extremities from the stretcher independently to approximately 25 to 30 degrees.  Muscle strength is equal bilaterally.   ED Results / Procedures / Treatments   Labs (all labs ordered are listed, but only abnormal results are displayed) Labs Reviewed - No data to display    PROCEDURES:  Critical Care performed:   Procedures   MEDICATIONS ORDERED IN ED: Medications  methocarbamol  (ROBAXIN ) tablet 500 mg (has no administration in time range)  oxyCODONE -acetaminophen  (PERCOCET) 7.5-325 MG per tablet 1 tablet (1 tablet Oral Given 01/28/24 1035)     IMPRESSION / MDM / ASSESSMENT AND PLAN / ED COURSE  I reviewed the triage vital signs and the nursing notes.   Differential diagnosis includes, but is not limited to, cauda equina considered, worsening of his radiculopathy, myelopathy, neuropathy, spinal canal stenosis, spinal cord impingement.  His to the ED with complaint of increased pain after working yesterday.  Patient has been seen in the emergency department and also has been seeing neurosurgery for multiple back issues.  He has been seeing Dr. Felipe Horton and surgery has already been discussed.  Patient reports he has not taken any pain medication.  Denies any symptoms consistent with cauda equina and states that his left leg is slightly more numb than it was yesterday before working.  MRI report was reviewed and also Dr. Cleora Daft was in to see the patient as well.  I discussed this patient with Dr. Jeris Montes who is on-call for neurosurgery.  He also copied and Dr. Felipe Horton about the care of this patient.  Patient was given Percocet while in the ED and was asleep.  Patient states he will come into the hospital if he has to but if he could just get some relief of the pain he would like to go home.  Dr. Mont Antis also suggested methocarbamol  every 6 hours.  He has an appointment with Dr. Felipe Horton on Monday, 02/01/2024 where they  will proceed to talk about timing of his surgery.  At this time patient continues to take Xarelto .  Wife is aware that prescriptions are being sent to their pharmacy and a work note is being written.      Patient's presentation is most consistent with acute presentation with potential threat to life or bodily function.  FINAL CLINICAL IMPRESSION(S) / ED DIAGNOSES   Final diagnoses:  Thoracic spondylosis with myelopathy     Rx / DC Orders   ED Discharge Orders          Ordered    methocarbamol  (ROBAXIN ) 500 MG tablet  Every 6 hours PRN        01/28/24 1159    oxyCODONE  (OXY IR/ROXICODONE ) 5 MG immediate release tablet  Every 6 hours PRN        01/28/24 1159             Note:  This document was prepared using Dragon voice recognition software and may include unintentional dictation errors.   Stafford Eagles, PA-C 01/28/24 1233    Stafford Eagles, PA-C 01/28/24 1246    Twilla Galea, MD 01/28/24 [phone]

## 2024-01-29 NOTE — Telephone Encounter (Signed)
 I think this timeline is reasonable. He's been progressing over the course of moths to years.

## 2024-02-01 ENCOUNTER — Encounter: Payer: Self-pay | Admitting: Neurosurgery

## 2024-02-01 ENCOUNTER — Ambulatory Visit (INDEPENDENT_AMBULATORY_CARE_PROVIDER_SITE_OTHER): Admitting: Neurosurgery

## 2024-02-01 VITALS — BP 142/98 | Ht 65.0 in | Wt 306.0 lb

## 2024-02-01 DIAGNOSIS — M4714 Other spondylosis with myelopathy, thoracic region: Secondary | ICD-10-CM

## 2024-02-01 NOTE — Progress Notes (Signed)
 Primary Physician:  Pcp, No   Chief Complaint:    History of Present Illness: 02/01/2024 Dalton Pena is a 43 y.o. male who presents with the chief complaint of abdominal pain and lower extremity tingling.  He has multisegment thoracic myelopathy for which we are planning on decompressing and fusing given his progressive neurologic symptoms.  Unfortunately he has a complex cardiac and medical history including coagulation for DVT PE history.  He is reestablishing care with his cardiologist and a general medicine physician.  Will plan on having him follow-up with us  as soon as he has had these appointments so we can know whether or not to go forward with a surgical decompression and fusion.  The symptoms are causing a significant impact on the patient's life.    Review of Systems:  A 10 point review of systems is negative, except for the pertinent positives and negatives detailed in the HPI.   Past Medical History:     Past Medical History:  Diagnosis Date   Deep vein thrombosis (DVT) (HCC)     Pulmonary embolism (HCC)            Past Surgical History: History reviewed. No pertinent surgical history.       Allergies:    Allergies as of 01/19/2024   (No Known Allergies)      Medications: Patient states he is on blood thinners for his DVT history.     Social History: Social History  Social History         Tobacco Use   Smoking status: Every Day      Current packs/day: 0.50      Types: Cigarettes   Smokeless tobacco: Never  Vaping Use   Vaping status: Every Day  Substance Use Topics   Alcohol use: Yes      Comment: occ.   Drug use: Yes      Types: Marijuana        Family Medical History:      Family History  Problem Relation Age of Onset   Heart failure Father            Physical Examination:    Vitals:    01/19/24 1204  BP: (!) 144/113  Pulse: 69  Resp: 20  Temp: 98.1 F (36.7 C)  SpO2: 98%        General:Patient is well developed,  well nourished, calm, collected, and in no apparent distress.   NEUROLOGICAL:  General: In no acute distress.   Awake, alert, oriented to person, place, and time.  Pupils equal round and reactive to light.  Facial tone is symmetric.  Tongue protrusion is midline.  There is no pronator drift.   Strength: Side Iliopsoas Quads Hamstring PF DF EHL  R 5 5 5 5  4+ 4  L 5 5 5 5  4+ 4      Bilateral upper extremity with good sensation, bilateral lower extremities with a stocking-like sensation changes.   Reflexes are 3+ at the patellas and Achilles, 3-4 beats of clonus on the right 2 beats of clonus on the left upgoing toes on the left, mute on the right.  Upper extremity reflexes 2+ without Hoffman's   Clonus is not present.  Toes are down-going.     Imaging:  Imaging Results (Last 48 hours)  MR LUMBAR SPINE WO CONTRAST Result Date: 01/19/2024 CLINICAL DATA:  Provided history: Low back pain, cauda equina syndrome suspected. Ataxia, thoracic trauma. Additional history provided: The patient reports worsening bilateral leg numbness  with leg pain and swelling. EXAM: MRI THORACIC AND LUMBAR SPINE WITHOUT CONTRAST TECHNIQUE: Multiplanar and multiecho pulse sequences of the thoracic and lumbar spine were obtained without intravenous contrast. COMPARISON:  Chest CT 01/19/2024. Lumbar spine radiographs 10/23/2013. FINDINGS: MRI THORACIC SPINE FINDINGS Mild intermittent motion degradation. Within this limitation, findings are as follows. Alignment: Dextrocurvature of the thoracic spine. No significant spondylolisthesis. Vertebrae: No thoracic vertebral compression fracture. No significant marrow edema or focal worrisome marrow lesion. Cord:  Multilevel spinal cord signal abnormality as described below. Paraspinal and other soft tissues: Please see same day chest CT for a description of intrathoracic findings. No paraspinal mass or collection. Disc levels: No more than mild disc degeneration within the thoracic  spine. Developmentally narrow thoracic spinal canal due to short pedicles. T1-T2: Facet arthropathy and spurring. Ligamentum flavum hypertrophy. Mild-to-moderate spinal canal stenosis. Bilateral neural foraminal narrowing. T2-T3: Facet arthropathy and spurring. Ligamentum flavum hypertrophy and calcification. Severe spinal canal stenosis with spinal cord impingement. T2 hyperintense signal abnormality within the spinal cord compatible with myelomalacia and/or edema. Bilateral neural foraminal narrowing. T3-T4: Facet arthropathy and spurring. Ligamentum flavum hypertrophy and calcification. Severe spinal canal stenosis with spinal cord impingement. T2 hyperintense signal abnormality within the spinal cord compatible with myelomalacia and/or edema. Bilateral neural foraminal narrowing. T4-T5: Facet arthropathy and spurring. Ligamentum flavum hypertrophy and calcification. Severe spinal canal stenosis with spinal cord flattening. No definite signal abnormality identified within the spinal cord at this level. Bilateral neural foraminal narrowing. T5-T6: Posterior disc osteophyte complex to the left. Facet arthropathy and spurring. Ligamentum flavum hypertrophy. The posterior disc osteophyte complex effaces the ventral thecal sac on the left, flattening the ventral aspect of the spinal cord. However, the dorsal CSF space is maintained within the spinal canal. Small focus of T2 hyperintense signal abnormality within the left aspect of the spinal cord immediately above the disc space level, which may reflect myelomalacia or focal edema (series 20, image 16). Bilateral neural foraminal narrowing. T6-T7: Slight disc bulge. Facet arthropathy and spurring. Mild degenerative spinal canal narrowing. Bilateral neural foraminal narrowing. T7-T8: Slight disc bulge. Facet arthropathy and spurring. Ligamentum flavum hypertrophy. Mild degenerative spinal canal stenosis. Bilateral neural foraminal narrowing. T8-T9: Slight disc bulge.  Facet arthropathy and spurring. Ligamentum flavum hypertrophy and calcification. Mild degenerative spinal canal narrowing. Bilateral neural foraminal narrowing. T9-T10: Facet arthropathy and spurring. Ligamentum flavum hypertrophy and calcification. Severe spinal canal stenosis with spinal cord impingement. No definite spinal cord signal abnormality identified at this level. Bilateral neural foraminal narrowing (greater on the left). T10-T11: Facet arthropathy and spurring. Ligamentum flavum hypertrophy and calcification. Severe spinal canal stenosis with spinal cord impingement. T2 hyperintense spinal cord signal abnormality questioned at this site, which may reflect myelomalacia and/or edema. Bilateral neural foraminal narrowing (greater on the right). T11-T12: Facet arthropathy and spurring. Ligamentum flavum hypertrophy and calcification. Severe spinal canal stenosis with spinal cord flattening. No definite spinal cord signal abnormality identified at this level. MRI LUMBAR SPINE FINDINGS Segmentation: 5 lumbar vertebrae. The caudal most well-formed intervertebral disc space is designated L5-S1. Alignment:  No significant spondylolisthesis. Vertebrae: No lumbar vertebral compression fracture. No significant marrow edema or focal worrisome marrow lesion. Conus medullaris and cauda equina: Conus extends to the L1-L2 level. No signal abnormality identified within the visualized distal spinal cord. Paraspinal and other soft tissues: No abnormality identified within included portions of the abdomen/retroperitoneum. No paraspinal mass or collection. Disc levels: Mild disc degeneration at L5-S1. Developmentally narrow lumbar spinal canal due to short pedicles. T12-L1: Mild facet arthropathy  and ligamentum flavum hypertrophy. Mild degenerative spinal canal narrowing. No significant foraminal stenosis. L1-L2: Mild facet arthropathy. No significant disc herniation or degenerative stenosis. L2-L3: Disc bulge with endplate  spurring. Mild facet arthropathy. No significant disc herniation or degenerative spinal canal stenosis. Mild relative bilateral neural foraminal narrowing. L3-L4: Disc bulge with endplate spurring. Mild facet arthropathy mild degenerative spinal canal narrowing. Bilateral neural foraminal narrowing (mild right, mild-to-moderate left). L4-L5: Disc bulge with endplate spurring. Facet hypertrophy. No significant degenerative spinal canal stenosis. Bilateral neural foraminal narrowing (moderate right, mild-to-moderate left). L5-S1: Disc bulge with endplate spurring. Superimposed small central disc protrusion eccentric to the left (at site of posterior annular fissure). Mild facet arthropathy on the right. The disc bulge mildly effaces the ventral thecal sac with mild left subarticular narrowing and posterior displacement of the descending left S1 nerve root (series 19, image 28). No significant degenerative central canal stenosis. Moderate-to-severe bilateral neural foraminal narrowing. Thoracic spine MRI impressions #1 and #2 called by telephone at the time of interpretation on 01/19/2024 at 4:17 pm to provider Dr. Cleora Daft, who verbally acknowledged these results. IMPRESSION: Thoracic spine: 1. Mildly motion degraded exam. 2. 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 3. Most notably, there is severe spinal canal stenosis at the T2-T3, T3-T4, T4-T5, T9-T10, T10-T11 and T11-T12 levels with spinal cord flattening/impingement. Signal abnormality within the spinal cord at the T2-T3, T3-T4 and possibly T10-T11 levels compatible with myelomalacia and/or focal edema. 4. At T5-T6, a posterior disc osteophyte complex to the left effaces the ventral thecal sac and flattens the ventral aspect of the spinal cord. However, the dorsal CSF space is maintained within the spinal canal. Subtle signal abnormality within the left aspect of the spinal cord  immediately above the T5-T6 disc space level, which may reflect myelomalacia and/or focal edema. 5. Sites of lesser spinal canal stenosis as described. 6. Multilevel foraminal stenosis. 7. Dextrocurvature of the thoracic spine. Lumbar spine: 1. Lumbar spondylosis superimposed upon a congenitally narrow lumbar spinal canal as outlined within the body of the report. 2. At L5-S1, a disc protrusion mildly effaces the ventral thecal sac with slight posterior displacement of the descending left S1 nerve root. 3. No more than mild degenerative spinal canal narrowing at the remaining lumbar levels. 4. Multilevel foraminal stenosis, greatest bilaterally at L5-S1 (moderate-to-severe at this level). Electronically Signed   By: Bascom Lily D.O.   On: 01/19/2024 16:19    MR THORACIC SPINE WO CONTRAST Result Date: 01/19/2024 CLINICAL DATA:  Provided history: Low back pain, cauda equina syndrome suspected. Ataxia, thoracic trauma. Additional history provided: The patient reports worsening bilateral leg numbness with leg pain and swelling. EXAM: MRI THORACIC AND LUMBAR SPINE WITHOUT CONTRAST TECHNIQUE: Multiplanar and multiecho pulse sequences of the thoracic and lumbar spine were obtained without intravenous contrast. COMPARISON:  Chest CT 01/19/2024. Lumbar spine radiographs 10/23/2013. FINDINGS: MRI THORACIC SPINE FINDINGS Mild intermittent motion degradation. Within this limitation, findings are as follows. Alignment: Dextrocurvature of the thoracic spine. No significant spondylolisthesis. Vertebrae: No thoracic vertebral compression fracture. No significant marrow edema or focal worrisome marrow lesion. Cord:  Multilevel spinal cord signal abnormality as described below. Paraspinal and other soft tissues: Please see same day chest CT for a description of intrathoracic findings. No paraspinal mass or collection. Disc levels: No more than mild disc degeneration within the thoracic spine. Developmentally narrow thoracic  spinal canal due to short pedicles. T1-T2: Facet arthropathy and spurring. Ligamentum flavum hypertrophy.  Mild-to-moderate spinal canal stenosis. Bilateral neural foraminal narrowing. T2-T3: Facet arthropathy and spurring. Ligamentum flavum hypertrophy and calcification. Severe spinal canal stenosis with spinal cord impingement. T2 hyperintense signal abnormality within the spinal cord compatible with myelomalacia and/or edema. Bilateral neural foraminal narrowing. T3-T4: Facet arthropathy and spurring. Ligamentum flavum hypertrophy and calcification. Severe spinal canal stenosis with spinal cord impingement. T2 hyperintense signal abnormality within the spinal cord compatible with myelomalacia and/or edema. Bilateral neural foraminal narrowing. T4-T5: Facet arthropathy and spurring. Ligamentum flavum hypertrophy and calcification. Severe spinal canal stenosis with spinal cord flattening. No definite signal abnormality identified within the spinal cord at this level. Bilateral neural foraminal narrowing. T5-T6: Posterior disc osteophyte complex to the left. Facet arthropathy and spurring. Ligamentum flavum hypertrophy. The posterior disc osteophyte complex effaces the ventral thecal sac on the left, flattening the ventral aspect of the spinal cord. However, the dorsal CSF space is maintained within the spinal canal. Small focus of T2 hyperintense signal abnormality within the left aspect of the spinal cord immediately above the disc space level, which may reflect myelomalacia or focal edema (series 20, image 16). Bilateral neural foraminal narrowing. T6-T7: Slight disc bulge. Facet arthropathy and spurring. Mild degenerative spinal canal narrowing. Bilateral neural foraminal narrowing. T7-T8: Slight disc bulge. Facet arthropathy and spurring. Ligamentum flavum hypertrophy. Mild degenerative spinal canal stenosis. Bilateral neural foraminal narrowing. T8-T9: Slight disc bulge. Facet arthropathy and spurring.  Ligamentum flavum hypertrophy and calcification. Mild degenerative spinal canal narrowing. Bilateral neural foraminal narrowing. T9-T10: Facet arthropathy and spurring. Ligamentum flavum hypertrophy and calcification. Severe spinal canal stenosis with spinal cord impingement. No definite spinal cord signal abnormality identified at this level. Bilateral neural foraminal narrowing (greater on the left). T10-T11: Facet arthropathy and spurring. Ligamentum flavum hypertrophy and calcification. Severe spinal canal stenosis with spinal cord impingement. T2 hyperintense spinal cord signal abnormality questioned at this site, which may reflect myelomalacia and/or edema. Bilateral neural foraminal narrowing (greater on the right). T11-T12: Facet arthropathy and spurring. Ligamentum flavum hypertrophy and calcification. Severe spinal canal stenosis with spinal cord flattening. No definite spinal cord signal abnormality identified at this level. MRI LUMBAR SPINE FINDINGS Segmentation: 5 lumbar vertebrae. The caudal most well-formed intervertebral disc space is designated L5-S1. Alignment:  No significant spondylolisthesis. Vertebrae: No lumbar vertebral compression fracture. No significant marrow edema or focal worrisome marrow lesion. Conus medullaris and cauda equina: Conus extends to the L1-L2 level. No signal abnormality identified within the visualized distal spinal cord. Paraspinal and other soft tissues: No abnormality identified within included portions of the abdomen/retroperitoneum. No paraspinal mass or collection. Disc levels: Mild disc degeneration at L5-S1. Developmentally narrow lumbar spinal canal due to short pedicles. T12-L1: Mild facet arthropathy and ligamentum flavum hypertrophy. Mild degenerative spinal canal narrowing. No significant foraminal stenosis. L1-L2: Mild facet arthropathy. No significant disc herniation or degenerative stenosis. L2-L3: Disc bulge with endplate spurring. Mild facet  arthropathy. No significant disc herniation or degenerative spinal canal stenosis. Mild relative bilateral neural foraminal narrowing. L3-L4: Disc bulge with endplate spurring. Mild facet arthropathy mild degenerative spinal canal narrowing. Bilateral neural foraminal narrowing (mild right, mild-to-moderate left). L4-L5: Disc bulge with endplate spurring. Facet hypertrophy. No significant degenerative spinal canal stenosis. Bilateral neural foraminal narrowing (moderate right, mild-to-moderate left). L5-S1: Disc bulge with endplate spurring. Superimposed small central disc protrusion eccentric to the left (at site of posterior annular fissure). Mild facet arthropathy on the right. The disc bulge mildly effaces the ventral thecal sac with mild left subarticular narrowing and posterior displacement of the descending left  S1 nerve root (series 19, image 28). No significant degenerative central canal stenosis. Moderate-to-severe bilateral neural foraminal narrowing. Thoracic spine MRI impressions #1 and #2 called by telephone at the time of interpretation on 01/19/2024 at 4:17 pm to provider Dr. Cleora Daft, who verbally acknowledged these results. IMPRESSION: Thoracic spine: 1. Mildly motion degraded exam. 2. 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 3. Most notably, there is severe spinal canal stenosis at the T2-T3, T3-T4, T4-T5, T9-T10, T10-T11 and T11-T12 levels with spinal cord flattening/impingement. Signal abnormality within the spinal cord at the T2-T3, T3-T4 and possibly T10-T11 levels compatible with myelomalacia and/or focal edema. 4. At T5-T6, a posterior disc osteophyte complex to the left effaces the ventral thecal sac and flattens the ventral aspect of the spinal cord. However, the dorsal CSF space is maintained within the spinal canal. Subtle signal abnormality within the left aspect of the spinal cord immediately above the  T5-T6 disc space level, which may reflect myelomalacia and/or focal edema. 5. Sites of lesser spinal canal stenosis as described. 6. Multilevel foraminal stenosis. 7. Dextrocurvature of the thoracic spine. Lumbar spine: 1. Lumbar spondylosis superimposed upon a congenitally narrow lumbar spinal canal as outlined within the body of the report. 2. At L5-S1, a disc protrusion mildly effaces the ventral thecal sac with slight posterior displacement of the descending left S1 nerve root. 3. No more than mild degenerative spinal canal narrowing at the remaining lumbar levels. 4. Multilevel foraminal stenosis, greatest bilaterally at L5-S1 (moderate-to-severe at this level). Electronically Signed   By: Bascom Lily D.O.   On: 01/19/2024 16:19    CT Angio Chest PE W and/or Wo Contrast Result Date: 01/19/2024 CLINICAL DATA:  Bilateral leg numbness, pain, swelling, shortness of breath, dizziness, history of DVT/PE EXAM: CT ANGIOGRAPHY CHEST WITH CONTRAST TECHNIQUE: Multidetector CT imaging of the chest was performed using the standard protocol during bolus administration of intravenous contrast. Multiplanar CT image reconstructions and MIPs were obtained to evaluate the vascular anatomy. RADIATION DOSE REDUCTION: This exam was performed according to the departmental dose-optimization program which includes automated exposure control, adjustment of the mA and/or kV according to patient size and/or use of iterative reconstruction technique. CONTRAST:  OMNIPAQUE  IOHEXOL  350 MG/ML SOLN COMPARISON:  12/30/2022 FINDINGS: Cardiovascular: Heart size normal. No pericardial effusion. The RV is nondilated. Satisfactory opacification of pulmonary arteries noted, and there is no evidence of pulmonary emboli. Adequate contrast opacification of the thoracic aorta with no evidence of dissection, aneurysm, or stenosis. There is classic 3-vessel brachiocephalic arch anatomy without proximal stenosis. Mediastinum/Nodes: No mediastinal  hematoma, mass, or adenopathy. Lungs/Pleura: No pleural effusion. No pneumothorax. Small blebs in the posterior left upper lobe. Lungs otherwise clear. Upper Abdomen: No acute findings. Musculoskeletal: No chest wall abnormality. No acute or significant osseous findings. Review of the MIP images confirms the above findings. IMPRESSION: 1. Negative for acute PE or thoracic aortic dissection. 2. No acute findings. Electronically Signed   By: Nicoletta Barrier M.D.   On: 01/19/2024 15:38    US  Venous Img Lower Bilateral Result Date: 01/19/2024 CLINICAL DATA:  Leg swelling, shortness of breath, pain EXAM: BILATERAL LOWER EXTREMITY VENOUS DOPPLER ULTRASOUND TECHNIQUE: Gray-scale sonography with compression, as well as color and duplex ultrasound, were performed to evaluate the deep venous system(s) from the level of the common femoral vein through the popliteal and proximal calf veins. COMPARISON:  12/30/2022 FINDINGS: VENOUS Normal compressibility of the common femoral, superficial femoral, and popliteal  veins, as well as the visualized calf veins. Visualized portions of profunda femoral vein and great saphenous vein unremarkable. No filling defects to suggest DVT on grayscale or color Doppler imaging. Doppler waveforms show normal direction of venous flow, normal respiratory plasticity and response to augmentation. OTHER None. Limitations: none IMPRESSION: Negative. Electronically Signed   By: Nicoletta Barrier M.D.   On: 01/19/2024 15:34    DG Chest 2 View Result Date: 01/19/2024 CLINICAL DATA:  SOB EXAM: CHEST - 2 VIEW COMPARISON:  12/30/2022 FINDINGS: Increase in perihilar interstitial prominence. Stable ill-defined left retrocardiac opacity. Heart size and mediastinal contours are within normal limits. No effusion. Visualized bones unremarkable. IMPRESSION: Increase in perihilar interstitial prominence. Electronically Signed   By: Nicoletta Barrier M.D.   On: 01/19/2024 15:33       Labs:     Latest Ref Rng & Units 01/19/2024    12:00 PM 12/30/2022   12:44 PM 12/30/2022    1:52 AM  CBC  WBC 4.0 - 10.5 K/uL 12.3  10.2  11.5   Hemoglobin 13.0 - 17.0 g/dL 57.8  46.9  62.9   Hematocrit 39.0 - 52.0 % 47.1  44.9  43.7   Platelets 150 - 400 K/uL 295  293  261         Latest Ref Rng & Units 01/19/2024   12:00 PM 12/30/2022   12:44 PM 12/30/2022    1:52 AM  BMP  Glucose 70 - 99 mg/dL 528  413  244   BUN 6 - 20 mg/dL 13  12  17    Creatinine 0.61 - 1.24 mg/dL 0.10  2.72  5.36   Sodium 135 - 145 mmol/L 138  139  139   Potassium 3.5 - 5.1 mmol/L 3.9  4.2  3.8   Chloride 98 - 111 mmol/L 103  103  106   CO2 22 - 32 mmol/L 29  24  25    Calcium 8.9 - 10.3 mg/dL 9.1  8.5  8.4           Assessment and Plan: [name] is a pleasant 43 y.o. male with presentation of bilateral lower extremity numbness and tingling.  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  This been going on since approximately December or January of this year. His stenosis is severe at 2 separate sections of his thoracic spine, he has T2 signal change noted at these 2 sections which are likely causing a double crush type phenomenon and are both contributing separately to his progressive thoracic myelopathy.  I would plan on a T1-T5 posterior decompression and fusion with stereotactic guidance, and a separate T9-T12 posterior decompression and fusion.  He has a congenitally stenotic spine, however these are the levels in which there stenosis is most severe in which he has excess calcification of his ligaments.  Given the wide decompression needed to decompress his ligaments and his medial facets, I believe he will need a posterior spinal instrumentation/fusion for stabilization.  This was discussed in detail with him, we discussed the risks of surgery which are severe including spinal cord injury, paralysis, weakness numbness tingling, CSF leak, need for further surgery, hardware failure, and also given his medical history medical complications such  as cardiac pulmonary vascular and hematologic among others.  I would like to have him optimized for surgery with evaluation by medicine for ability to have him off of his anticoagulation and whether or not he needs a DVT filter.    Carroll Clamp, MD/MSCR Dept. of  Neurosurgery

## 2024-02-17 ENCOUNTER — Ambulatory Visit: Attending: Cardiology | Admitting: Cardiology

## 2024-02-17 ENCOUNTER — Encounter: Payer: Self-pay | Admitting: Cardiology

## 2024-02-17 VITALS — BP 117/66 | HR 54 | Ht 65.0 in | Wt 305.0 lb

## 2024-02-17 DIAGNOSIS — R072 Precordial pain: Secondary | ICD-10-CM | POA: Diagnosis not present

## 2024-02-17 DIAGNOSIS — Z01810 Encounter for preprocedural cardiovascular examination: Secondary | ICD-10-CM | POA: Diagnosis present

## 2024-02-17 DIAGNOSIS — F17200 Nicotine dependence, unspecified, uncomplicated: Secondary | ICD-10-CM | POA: Insufficient documentation

## 2024-02-17 NOTE — Progress Notes (Signed)
 Cardiology Office Note:    Date:  02/17/2024   ID:  Dalton Pena, DOB 1981-04-27, MRN [id number]  PCP:  Pcp, No   [facility] Providers Cardiologist:  None     Referring MD: Twilla Galea, MD   Chief Complaint  Patient presents with   Follow-up    Having active chest since this morning, with left side sharp pain    History of Present Illness:    Dalton Pena is a 43 y.o. male with a hx of DVT/PE on Xarelto , obesity, current smoker x 20 years, COPD who presents with chest pain and shortness of breath.  States having history of chronic back pain, spinal surgery being considered in the near future.  Endorse having occasional chest pains with exertion.  He currently smokes.  Father had a heart attack in his 60s.  Chest pain is located on the left side.  Also has shortness of breath with exertion.  Recently seen in the ED last month.  Symptoms of shortness of breath and chest pain, chest CT showed no PE or aortic dissection.  Echocardiogram 01/2021 EF 55 to 60%, normal diastolic function, no significant valvular disease.  Past Medical History:  Diagnosis Date   Deep vein thrombosis (DVT) (HCC)    Pulmonary embolism (HCC)     History reviewed. No pertinent surgical history.  Current Medications: Current Meds  Medication Sig   albuterol  (VENTOLIN  HFA) 108 (90 Base) MCG/ACT inhaler Inhale 2 puffs into the lungs.   methocarbamol  (ROBAXIN ) 500 MG tablet Take 1 tablet (500 mg total) by mouth every 6 (six) hours as needed.   methylPREDNISolone  (MEDROL  DOSEPAK) 4 MG TBPK tablet See admin instructions.   oxyCODONE  (OXY IR/ROXICODONE ) 5 MG immediate release tablet Take 1 tablet (5 mg total) by mouth every 6 (six) hours as needed (for pain).   rivaroxaban  (XARELTO ) 20 MG TABS tablet Take 1 tablet (20 mg total) by mouth daily with supper.     Allergies:   Patient has no known allergies.   Social History   Socioeconomic History   Marital status: Single    Spouse  name: Not on file   Number of children: Not on file   Years of education: Not on file   Highest education level: Not on file  Occupational History   Not on file  Tobacco Use   Smoking status: Every Day    Current packs/day: 0.50    Types: Cigarettes   Smokeless tobacco: Never  Vaping Use   Vaping status: Every Day  Substance and Sexual Activity   Alcohol use: Yes    Comment: occ.   Drug use: Yes    Types: Marijuana   Sexual activity: Yes  Other Topics Concern   Not on file  Social History Narrative   Not on file   Social Drivers of Health   Financial Resource Strain: High Risk (02/05/2024)   Received from [facility]   Overall Financial Resource Strain (CARDIA)    Difficulty of Paying Living Expenses: Hard  Food Insecurity: Food Insecurity Present (02/05/2024)   Received from [facility]   Hunger Vital Sign    Worried About Running Out of Food in the Last Year: Often true    Ran Out of Food in the Last Year: Often true  Transportation Needs: Unmet Transportation Needs (02/05/2024)   Received from [facility]   PRAPARE - Transportation    Lack of Transportation (Medical): Yes    Lack of Transportation (Non-Medical):  Yes  Physical Activity: Not on file  Stress: Not on file  Social Connections: Not on file     Family History: The patient's family history includes Diabetes in his maternal grandmother and mother; Fainting in his paternal grandfather; Heart attack in his paternal aunt and paternal uncle; Heart failure in his father.  ROS:   Please see the history of present illness.     All other systems reviewed and are negative.  EKGs/Labs/Other Studies Reviewed:    The following studies were reviewed today:  EKG Interpretation Date/Time:  Wednesday Feb 17 2024 09:51:03 EDT Ventricular Rate:  54 PR Interval:  184 QRS Duration:  106 QT Interval:  418 QTC Calculation: 396 R Axis:   6  Text Interpretation: Sinus bradycardia Possible Inferior infarct ,  age undetermined Confirmed by Constancia Delton ([id number]) on 02/17/2024 9:54:52 AM    Recent Labs: 01/19/2024: ALT 41; B Natriuretic Peptide 29.5; BUN 13; Creatinine, Ser 1.11; Hemoglobin 15.7; Platelets 295; Potassium 3.9; Sodium 138  Recent Lipid Panel    Component Value Date/Time   CHOL 171 01/25/2021 0503   TRIG 93 01/25/2021 0503   HDL 27 (L) 01/25/2021 0503   CHOLHDL 6.3 01/25/2021 0503   VLDL 19 01/25/2021 0503   LDLCALC 125 (H) 01/25/2021 0503     Risk Assessment/Calculations:               Physical Exam:    VS:  BP 117/66 (BP Location: Left Arm, Patient Position: Sitting, Cuff Size: Normal)   Pulse (!) 54   Ht 5\' 5"  (1.651 m)   Wt (!) 305 lb (138.3 kg)   SpO2 96%   BMI 50.75 kg/m     Wt Readings from Last 3 Encounters:  02/17/24 (!) 305 lb (138.3 kg)  02/01/24 (!) 306 lb (138.8 kg)  01/28/24 (!) 306 lb 7 oz (139 kg)     GEN:  Well nourished, well developed in no acute distress HEENT: Normal NECK: No JVD; No carotid bruits CARDIAC: RRR, no murmurs, rubs, gallops RESPIRATORY:  Clear to auscultation without rales, wheezing or rhonchi  ABDOMEN: Soft, non-tender, non-distended MUSCULOSKELETAL:  No edema; No deformity  SKIN: Warm and dry NEUROLOGIC:  Alert and oriented x 3 PSYCHIATRIC:  Normal affect   ASSESSMENT:    1. Precordial pain   2. Pre-procedural cardiovascular examination   3. Morbid obesity (HCC)   4. Current smoker    PLAN:    In order of problems listed above:  Chest pain, several risk factors.  Get echo, get Lexiscan Myoview. Preprocedural exam, spinal surgery being planned.  Cardiac workup as above.  Okay for procedure if no significant abnormalities noted. Morbid obesity, low-calorie diet emphasized. Current smoker, smoking cessation advised.  Follow-up after cardiac testing.    Informed Consent   Shared Decision Making/Informed Consent The risks [chest pain, shortness of breath, cardiac arrhythmias, dizziness, blood pressure  fluctuations, myocardial infarction, stroke/transient ischemic attack, nausea, vomiting, allergic reaction, radiation exposure, metallic taste sensation and life-threatening complications (estimated to be 1 in 10,000)], benefits (risk stratification, diagnosing coronary artery disease, treatment guidance) and alternatives of a nuclear stress test were discussed in detail with Mr. Dain and he agrees to proceed.       Medication Adjustments/Labs and Tests Ordered: Current medicines are reviewed at length with the patient today.  Concerns regarding medicines are outlined above.  Orders Placed This Encounter  Procedures   NM Myocar Multi W/Spect W/Wall Motion / EF   EKG 12-Lead   ECHOCARDIOGRAM COMPLETE  No orders of the defined types were placed in this encounter.   Patient Instructions  Medication Instructions:  Your Physician recommend you continue on your current medication as directed.    *If you need a refill on your cardiac medications before your next appointment, please call your pharmacy*  Lab Work: No labs ordered today  If you have labs (blood work) drawn today and your tests are completely normal, you will receive your results only by: MyChart Message (if you have MyChart) OR A paper copy in the mail If you have any lab test that is abnormal or we need to change your treatment, we will call you to review the results.  Testing/Procedures: Your physician has requested that you have an echocardiogram. Echocardiography is a painless test that uses sound waves to create images of your heart. It provides your doctor with information about the size and shape of your heart and how well your heart's chambers and valves are working.   You may receive an ultrasound enhancing agent through an IV if needed to better visualize your heart during the echo. This procedure takes approximately one hour.  There are no restrictions for this procedure.  This will take place at [address] [facility] [address], [place] [address]  Please note: We ask at that you not bring children with you during ultrasound (echo/ vascular) testing. Due to room size and safety concerns, children are not allowed in the ultrasound rooms during exams. Our front office staff cannot provide observation of children in our lobby area while testing is being conducted. An adult accompanying a patient to their appointment will only be allowed in the ultrasound room at the discretion of the ultrasound technician under special circumstances. We apologize for any inconvenience.    Your provider has ordered a Lexiscan/ Exercise Myoview Stress test. This will take place at [facility]. Please report to the [facility] medical mall entrance. The volunteers at the first desk will direct you where to go.  [facility] MYOVIEW  Your provider has ordered a Stress Test with nuclear imaging. The purpose of this test is to evaluate the blood supply to your heart muscle. This procedure is referred to as a "Non-Invasive Stress Test." This is because other than having an IV started in your vein, nothing is inserted or "invades" your body. Cardiac stress tests are done to find areas of poor blood flow to the heart by determining the extent of coronary artery disease (CAD). Some patients exercise on a treadmill, which naturally increases the blood flow to your heart, while others who are unable to walk on a treadmill due to physical limitations will have a pharmacologic/chemical stress agent called Lexiscan . This medicine will mimic walking on a treadmill by temporarily increasing your coronary blood flow.   Please note: these test may take anywhere between 2-4 hours to complete  How to prepare for your Myoview test:  Nothing to eat for 6 hours prior to the test No caffeine  for 24 hours prior to test No smoking 24 hours prior to test. Your medication may be taken with water.  If your doctor stopped a medication because of this test, do  not take that medication. No perfume, cologne or lotion.   PLEASE NOTIFY THE OFFICE AT LEAST 24 HOURS IN ADVANCE IF YOU ARE UNABLE TO KEEP YOUR APPOINTMENT.  [phone] AND  PLEASE NOTIFY NUCLEAR MEDICINE AT [facility] AT LEAST 24 HOURS IN ADVANCE IF YOU ARE UNABLE TO KEEP YOUR APPOINTMENT. [phone]  Follow-Up: At [facility], you and your health needs are our priority.  As part of our continuing mission to provide you with exceptional heart care, our providers are all part of one team.  This team includes your primary Cardiologist (physician) and Advanced Practice Providers or APPs (Physician Assistants and Nurse Practitioners) who all work together to provide you with the care you need, when you need it.  Your next appointment:   3 month(s)  Provider:   You may see Dr. Junnie Olives or one of the following Advanced Practice Providers on your designated Care Team:   Laneta Pintos, NP Gildardo Labrador, PA-C Varney Gentleman, PA-C Cadence [place], PA-C Ronald Cockayne, NP Morey Ar, NP    We recommend signing up for the patient portal called "MyChart".  Sign up information is provided on this After Visit Summary.  MyChart is used to connect with patients for Virtual Visits (Telemedicine).  Patients are able to view lab/test results, encounter notes, upcoming appointments, etc.  Non-urgent messages can be sent to your provider as well.   To learn more about what you can do with MyChart, go to [email or web address].      Signed, Constancia Delton, MD  02/17/2024 12:35 PM    [facility]

## 2024-02-17 NOTE — Patient Instructions (Signed)
 Medication Instructions:  Your Physician recommend you continue on your current medication as directed.    *If you need a refill on your cardiac medications before your next appointment, please call your pharmacy*  Lab Work: No labs ordered today  If you have labs (blood work) drawn today and your tests are completely normal, you will receive your results only by: MyChart Message (if you have MyChart) OR A paper copy in the mail If you have any lab test that is abnormal or we need to change your treatment, we will call you to review the results.  Testing/Procedures: Your physician has requested that you have an echocardiogram. Echocardiography is a painless test that uses sound waves to create images of your heart. It provides your doctor with information about the size and shape of your heart and how well your heart's chambers and valves are working.   You may receive an ultrasound enhancing agent through an IV if needed to better visualize your heart during the echo. This procedure takes approximately one hour.  There are no restrictions for this procedure.  This will take place at [address] [facility] [address] [facility]) [address], [place] [address]  Please note: We ask at that you not bring children with you during ultrasound (echo/ vascular) testing. Due to room size and safety concerns, children are not allowed in the ultrasound rooms during exams. Our front office staff cannot provide observation of children in our lobby area while testing is being conducted. An adult accompanying a patient to their appointment will only be allowed in the ultrasound room at the discretion of the ultrasound technician under special circumstances. We apologize for any inconvenience.    Your provider has ordered a Lexiscan/ Exercise Myoview Stress test. This will take place at [facility]. Please report to the [facility] medical mall entrance. The volunteers at the first desk will direct you where to go.  [facility]  MYOVIEW  Your provider has ordered a Stress Test with nuclear imaging. The purpose of this test is to evaluate the blood supply to your heart muscle. This procedure is referred to as a "Non-Invasive Stress Test." This is because other than having an IV started in your vein, nothing is inserted or "invades" your body. Cardiac stress tests are done to find areas of poor blood flow to the heart by determining the extent of coronary artery disease (CAD). Some patients exercise on a treadmill, which naturally increases the blood flow to your heart, while others who are unable to walk on a treadmill due to physical limitations will have a pharmacologic/chemical stress agent called Lexiscan . This medicine will mimic walking on a treadmill by temporarily increasing your coronary blood flow.   Please note: these test may take anywhere between 2-4 hours to complete  How to prepare for your Myoview test:  Nothing to eat for 6 hours prior to the test No caffeine  for 24 hours prior to test No smoking 24 hours prior to test. Your medication may be taken with water.  If your doctor stopped a medication because of this test, do not take that medication. No perfume, cologne or lotion.   PLEASE NOTIFY THE OFFICE AT LEAST 24 HOURS IN ADVANCE IF YOU ARE UNABLE TO KEEP YOUR APPOINTMENT.  [phone] AND  PLEASE NOTIFY NUCLEAR MEDICINE AT [facility] AT LEAST 24 HOURS IN ADVANCE IF YOU ARE UNABLE TO KEEP YOUR APPOINTMENT. [phone]   Follow-Up: At [facility], you and your health needs are our priority.  As part  of our continuing mission to provide you with exceptional heart care, our providers are all part of one team.  This team includes your primary Cardiologist (physician) and Advanced Practice Providers or APPs (Physician Assistants and Nurse Practitioners) who all work together to provide you with the care you need, when you need it.  Your next appointment:   3 month(s)  Provider:   You may see Dr.  Junnie Olives or one of the following Advanced Practice Providers on your designated Care Team:   Laneta Pintos, NP Gildardo Labrador, PA-C Varney Gentleman, PA-C Cadence [place], PA-C Ronald Cockayne, NP Morey Ar, NP    We recommend signing up for the patient portal called "MyChart".  Sign up information is provided on this After Visit Summary.  MyChart is used to connect with patients for Virtual Visits (Telemedicine).  Patients are able to view lab/test results, encounter notes, upcoming appointments, etc.  Non-urgent messages can be sent to your provider as well.   To learn more about what you can do with MyChart, go to [email or web address].

## 2024-02-25 ENCOUNTER — Ambulatory Visit

## 2024-03-04 ENCOUNTER — Other Ambulatory Visit: Payer: Self-pay | Admitting: Physician Assistant

## 2024-03-04 ENCOUNTER — Encounter: Payer: Self-pay | Admitting: Neurosurgery

## 2024-03-04 MED ORDER — TIZANIDINE HCL 4 MG PO TABS: 4.0000 mg | ORAL_TABLET | Freq: Three times a day (TID) | ORAL | 1 refills | Status: DC | PRN

## 2024-03-04 NOTE — Telephone Encounter (Signed)
 I notified Dalton Pena that we sent in tizanidine. I reminded him that this can make him drowsy and he may want to try it at night for the beginning. I also advised him that Ruthann Cover recommends he take tylenol , but avoid ibuprofen  or any antiinflammatory medications because of the xarelto . He verbalized understanding.

## 2024-03-04 NOTE — Telephone Encounter (Signed)
 I resent the rx to [facility] and sent it to [facility].

## 2024-03-04 NOTE — Telephone Encounter (Signed)
 I spoke with Dalton Pena. He is not taking anything for his symptoms other than ibuprofen , but it doesn't help.   The methocarbamol  "calmed it down a little bit, but it didn't do the full job". He had a medrol  dosepack in April for a heel spur, but he doesn't think it helped, but it did help him move around a little bit better. However, he doesn't want to keep taking them. The oxycodone  "soothed it, but it didn't take the pain away".  He does not think he has ever tried gabapentin.  He is open to trying a different muscle relaxer. He would like to try that first before considering gabapentin, as he is concerned about the side effects of gabapentin. I did inform him that all muscle relaxers can potentially make him sleepy.   [facility] [place].

## 2024-03-08 ENCOUNTER — Ambulatory Visit: Attending: Cardiology

## 2024-03-08 DIAGNOSIS — R072 Precordial pain: Secondary | ICD-10-CM | POA: Diagnosis not present

## 2024-03-08 LAB — ECHOCARDIOGRAM COMPLETE
AR max vel: 2.81 cm2
AV Area VTI: 2.63 cm2
AV Area mean vel: 2.61 cm2
AV Mean grad: 4 mmHg
AV Peak grad: 8.2 mmHg
Ao pk vel: 1.43 m/s
Area-P 1/2: 3.85 cm2
Calc EF: 54.8 %
S' Lateral: 3.5 cm
Single Plane A2C EF: 51.6 %
Single Plane A4C EF: 58.3 %

## 2024-03-09 ENCOUNTER — Ambulatory Visit: Payer: Self-pay | Admitting: Cardiology

## 2024-03-14 ENCOUNTER — Encounter

## 2024-03-14 ENCOUNTER — Encounter: Payer: Self-pay | Admitting: Cardiology

## 2024-03-14 ENCOUNTER — Telehealth: Payer: Self-pay | Admitting: Cardiology

## 2024-03-14 NOTE — Telephone Encounter (Signed)
 Left voicemail to reschedule 2 day myoview

## 2024-03-15 ENCOUNTER — Encounter

## 2024-04-04 ENCOUNTER — Encounter: Admission: RE | Admit: 2024-04-04 | Source: Ambulatory Visit

## 2024-04-05 ENCOUNTER — Encounter

## 2024-04-14 ENCOUNTER — Telehealth: Payer: Self-pay | Admitting: *Deleted

## 2024-04-14 ENCOUNTER — Encounter: Payer: Self-pay | Admitting: Cardiology

## 2024-04-14 NOTE — Telephone Encounter (Signed)
 Pt returning call to nurse

## 2024-04-14 NOTE — Telephone Encounter (Signed)
 Left voicemail message to call back

## 2024-04-14 NOTE — Telephone Encounter (Signed)
 04/14/24 10:09 AM Can you call me at your earliest convenience @ [phone]

## 2024-04-14 NOTE — Telephone Encounter (Signed)
 Patient returning call to [facility] in reference to scheduling his stress test. Unable to reach anyone to assist with getting this scheduled so advised that I would have someone give him a call. He verbalized understanding.

## 2024-04-20 ENCOUNTER — Telehealth: Payer: Self-pay | Admitting: Cardiology

## 2024-04-20 NOTE — Telephone Encounter (Signed)
 New Message:      Patient said he thought his Stress Test was supposed to be a 2 days test.  I also reached out to the scheduler to see if his test was allotted enough time.

## 2024-04-28 ENCOUNTER — Encounter: Payer: Self-pay | Admitting: Neurosurgery

## 2024-05-01 ENCOUNTER — Other Ambulatory Visit: Payer: Self-pay | Admitting: Physician Assistant

## 2024-05-02 ENCOUNTER — Other Ambulatory Visit

## 2024-05-16 ENCOUNTER — Encounter: Admission: RE | Admit: 2024-05-16 | Source: Ambulatory Visit

## 2024-05-17 ENCOUNTER — Encounter

## 2024-05-19 ENCOUNTER — Ambulatory Visit: Attending: Cardiology | Admitting: Cardiology

## 2024-06-09 ENCOUNTER — Encounter: Admission: RE | Admit: 2024-06-09 | Source: Ambulatory Visit

## 2024-06-09 ENCOUNTER — Encounter: Payer: Self-pay | Admitting: Cardiology

## 2024-06-10 ENCOUNTER — Encounter

## 2024-06-13 ENCOUNTER — Encounter: Payer: Self-pay | Admitting: Neurosurgery

## 2024-06-14 ENCOUNTER — Other Ambulatory Visit: Payer: Self-pay

## 2024-06-14 ENCOUNTER — Emergency Department: Admission: EM | Admit: 2024-06-14 | Discharge: 2024-06-14 | Disposition: A | Discharge status: Home / Self Care

## 2024-06-14 DIAGNOSIS — M545 Low back pain, unspecified: Secondary | ICD-10-CM | POA: Diagnosis present

## 2024-06-14 DIAGNOSIS — Z5329 Procedure and treatment not carried out because of patient's decision for other reasons: Secondary | ICD-10-CM | POA: Diagnosis not present

## 2024-06-14 DIAGNOSIS — M4804 Spinal stenosis, thoracic region: Secondary | ICD-10-CM | POA: Diagnosis not present

## 2024-06-14 MED ORDER — MORPHINE SULFATE (PF) 4 MG/ML IV SOLN: 4.0000 mg | Freq: Once | INTRAVENOUS | Status: DC

## 2024-06-14 MED ORDER — ONDANSETRON HCL 4 MG/2ML IJ SOLN: 4.0000 mg | Freq: Once | INTRAMUSCULAR | Status: DC

## 2024-06-14 MED ORDER — LIDOCAINE 5 % EX PTCH: 1.0000 | MEDICATED_PATCH | CUTANEOUS | Status: DC

## 2024-06-14 NOTE — ED Triage Notes (Signed)
 First Nurse Note: Patient to ED via [facility] from home for back pain. States pain radiates from chest to back. Also having bilateral leg pain. Hx blood clots.  147/96 104 HR 97% RA 166 cbg

## 2024-06-14 NOTE — Telephone Encounter (Signed)
 Left message for patient to call our office back.

## 2024-06-14 NOTE — ED Triage Notes (Signed)
 Patient states low back pain that radiates down bilateral legs

## 2024-06-14 NOTE — ED Notes (Addendum)
 Was in another room with a patient going over discharge and education when I heard yelling.  I stopped in to let him know I would be getting his blood and giving him his medication when he yelled he was leaving and we were not going to kill him. I know people come in and die from something they didn't even come in for.  The provider was nicely trying to get him to calm down so we can do the blood and meds.  I said sir, I'm sorry and he yelled with explicative that he was leaving and fussed about not getting his pain medication.  He said I was too late. He got up and and walked out yelling.  Advised Charge.

## 2024-06-14 NOTE — Discharge Instructions (Signed)
 Patient left the unit without any blood work, without any pain medication.  Patient was upset.  Patient was demanding to talk with his neurosurgeon tonight.  I did explain Dr. Claudene can work 24 hours a day 7 days a week.  I did explain Dr. Claudene offered to him the surgery.  He needs to see cardiology first for medical clearance.  Patient left the unit.

## 2024-06-14 NOTE — ED Provider Notes (Signed)
 [facility] Provider Note    Event Date/Time   First MD Initiated Contact with Patient 06/14/24 1940     (approximate)   History   Back Pain    HPI  Dalton Pena is a 43 y.o. male    with a past medical history of cough, calcaneal spur, thoracic spondylosis, lung disease, DVT, radiculopathy, pneumonia,who presents to the ED complaining of back pain   . According to the patient, symptoms are consistent with lower back pain, numbness and tingling of both legs, denies urinary retention, urinary incontinence, fecal incontinence.  Patient is taking blood thinners.  Patient reports abdominal pain located in the left hemiabdomen, patient feels his abdomen hard.  Patient denies history of trauma.  Patient is scheduled for lumbar surgery but needs to go to cardiology for evaluation.  Patient is here by himself.  Per independent chart review patient had a lumbar MRI on 01/19/2024 reporting thoracic spondylosis, congenitally narrowed thoracic spinal canal, lumbar spondylosis, L5/S1 disc protrusion.  CT angio chest PE on 01/19/2024 reported agree for PE.,  US  diagnosed lower bilateral on 01/19/2024 reported no DVT.  Dr. Penne Sharps from neurosurgery is planning to have a T1 T5 posterior decompression and fusion with stereotactic guidance due to his congenital stenotic spine     Patient Active Problem List   Diagnosis Date Noted   Thoracic spondylosis with myelopathy 01/19/2024   Acute respiratory failure with hypoxia (HCC) 01/24/2021   Tobacco abuse 01/24/2021   Abdominal pain 01/24/2021   Nausea vomiting and diarrhea 01/24/2021   Chest pain 01/24/2021   Chronic obstructive pulmonary disease (COPD) (HCC) 01/24/2021   Acute deep vein thrombosis (DVT) of right femoral vein (HCC) 03/15/2020   Recurrent pulmonary emboli (HCC) 03/15/2020   Obesity, Class III, BMI 40-49.9 (morbid obesity) 03/15/2020   Acute gastroenteritis 03/15/2020   Acute pulmonary embolism (HCC)  03/15/2020   Pulmonary embolism (HCC) 03/15/2020   Acute deep vein thrombosis (DVT) of calf muscle vein of right lower extremity (HCC)      ROS: Patient currently denies any vision changes, tinnitus, difficulty speaking, facial droop, sore throat, chest pain, shortness of breath, abdominal pain, nausea/vomiting/diarrhea, dysuria, or weakness/numbness/paresthesias in any extremity   Physical Exam   Triage Vital Signs: ED Triage Vitals  Encounter Vitals Group     BP 06/14/24 1844 (!) 150/88     Girls Systolic BP Percentile --      Girls Diastolic BP Percentile --      Boys Systolic BP Percentile --      Boys Diastolic BP Percentile --      Pulse Rate 06/14/24 1844 88     Resp --      Temp 06/14/24 1844 98.3 F (36.8 C)     Temp Source 06/14/24 1844 Oral     SpO2 06/14/24 1844 96 %     Weight 06/14/24 1844 (!) 309 lb (140.2 kg)     Height 06/14/24 1844 5' 4 (1.626 m)     Head Circumference --      Peak Flow --      Pain Score 06/14/24 1847 10     Pain Loc --      Pain Education --      Exclude from Growth Chart --     Most recent vital signs: Vitals:   06/14/24 1844  BP: (!) 150/88  Pulse: 88  Temp: 98.3 F (36.8 C)  SpO2: 96%     Physical Exam Vitals and nursing  note reviewed.  In triage patient was hypertensive  Constitutional:      General: Awake and alert.  With mild acute distress due to lumbar pain.    Appearance: Normal appearance. The patient is normal weight.      Able to speak in complete sentences without cough or dyspnea  HENT:     Head: Normocephalic and atraumatic.     Mouth: Mucous membranes are moist.  Eyes:     General: PERRL. Normal EOMs          Conjunctiva/sclera: Conjunctivae normal.  Nose No congestion/rhinorrhea  CV:                  Good peripheral perfusion.  Regular rate and rhythm  Resp:               Normal effort.  Equal breath sounds bilaterally.  Abd:                 No distention.  Abdomen is hard to palpation and left lower  quadrant, tender.  Guarding, no rebound.  Negative McBurney point, negative Rovsing. Musculoskeletal General: No swelling. Normal range of motion.  Lower thorax, lumbar spine: Tender to palpation paraspinal muscles, bilateral SLR negative.  No signs of saddle anesthesia.  Bilateral pulses positive.  Patient describes sensation of tightness when raising his legs. Skin:    General: Skin is warm and dry.     Capillary Refill: Capillary refill takes less than 2 seconds.     Findings: No rash.  Neurological:     Mental Status: The patient is awake and alert. MAE spontaneously. No gross focal neurologic deficits are appreciated.  Psychiatric Mood and affect are normal. Speech and behavior are normal.  ED Results / Procedures / Treatments   Labs (all labs ordered are listed, but only abnormal results are displayed) Labs Reviewed  COMPREHENSIVE METABOLIC PANEL WITH GFR  CBC WITH DIFFERENTIAL/PLATELET  URINALYSIS, ROUTINE W REFLEX MICROSCOPIC     EKG See physician read    RADIOLOGY I independently reviewed and interpreted imaging and agree with radiologists findings.      PROCEDURES:  Critical Care performed:   Procedures   MEDICATIONS ORDERED IN ED: Medications  ondansetron  (ZOFRAN ) injection 4 mg (has no administration in time range)  morphine  (PF) 4 MG/ML injection 4 mg (has no administration in time range)  lidocaine  (LIDODERM ) 5 % 1 patch (has no administration in time range)   Clinical Course as of 06/14/24 2133  Tue Jun 14, 2024  2125 Reassessed the patient, due to work load at that time nurses did not draw blood for CMP, CBC.  Patient did not get any pain medication.  I did share with the patient the results of the MRI on April 2020 25th.  Patient requested to talk with his neurosurgeon Dr. Claudene.  I did explain to the patient that we have a neurosurgeon on-call,.  Patient decided to leave without discharge recommendation.  [AE]    Clinical Course User Index [AE]  Janit Kast, PA-C    IMPRESSION / MDM / ASSESSMENT AND PLAN / ED COURSE  I reviewed the triage vital signs and the nursing notes.  Differential diagnosis includes, but is not limited to, diverticulitis, UTI, congenital thoracic spinal stenosis,   Patient's presentation is most consistent with acute complicated illness / injury requiring diagnostic workup.   Dalton Pena is a 43 y.o., male who presents today with worsening of lower back pain.  Patient describes lumbar pain is  associated with numbness and tingling of the lower extremities.  Denies urinary retention, fecal incontinence, urinary incontinence.  Patient reports abdominal pain.  On physical exam tenderness to palpation in lumbar and thoracic process.  No signs of saddle anesthesia. 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  I reassessed the patient, explained that the results of the MRI on April/25, patient was offered surgery but before he needs to be evaluated by cardiology. Due to workload nurses did not draw blood,  unable to give her medication.  Patient was upset, he was demanding to talk with Dr. Claudene hint.  I did explain to the patient that we have a neurosurgeon on-call, Dr. Claudene, work 24 hours a day 7 days a week. Patient left the unit.    FINAL CLINICAL IMPRESSION(S) / ED DIAGNOSES   Final diagnoses:  Thoracic spinal stenosis     Rx / DC Orders   ED Discharge Orders     None        Note:  This document was prepared using Dragon voice recognition software and may include unintentional dictation errors.   Janit Kast, PA-C 06/14/24 2133    Nicholaus Rolland BRAVO, MD 06/14/24 2325

## 2024-06-18 ENCOUNTER — Other Ambulatory Visit: Payer: Self-pay

## 2024-06-18 ENCOUNTER — Emergency Department

## 2024-06-18 ENCOUNTER — Emergency Department
Admission: EM | Admit: 2024-06-18 | Discharge: 2024-06-18 | Disposition: A | Discharge status: Home / Self Care | Attending: Emergency Medicine | Admitting: Emergency Medicine

## 2024-06-18 DIAGNOSIS — M5126 Other intervertebral disc displacement, lumbar region: Secondary | ICD-10-CM | POA: Insufficient documentation

## 2024-06-18 DIAGNOSIS — M5442 Lumbago with sciatica, left side: Secondary | ICD-10-CM | POA: Diagnosis not present

## 2024-06-18 DIAGNOSIS — F17200 Nicotine dependence, unspecified, uncomplicated: Secondary | ICD-10-CM | POA: Insufficient documentation

## 2024-06-18 DIAGNOSIS — M51A2 Intervertebral annulus fibrosus defect, large, lumbar region: Secondary | ICD-10-CM | POA: Insufficient documentation

## 2024-06-18 DIAGNOSIS — M25562 Pain in left knee: Secondary | ICD-10-CM | POA: Diagnosis not present

## 2024-06-18 DIAGNOSIS — Z7901 Long term (current) use of anticoagulants: Secondary | ICD-10-CM | POA: Insufficient documentation

## 2024-06-18 DIAGNOSIS — I2699 Other pulmonary embolism without acute cor pulmonale: Secondary | ICD-10-CM | POA: Diagnosis not present

## 2024-06-18 DIAGNOSIS — J449 Chronic obstructive pulmonary disease, unspecified: Secondary | ICD-10-CM | POA: Insufficient documentation

## 2024-06-18 DIAGNOSIS — G8929 Other chronic pain: Secondary | ICD-10-CM

## 2024-06-18 DIAGNOSIS — M51369 Other intervertebral disc degeneration, lumbar region without mention of lumbar back pain or lower extremity pain: Secondary | ICD-10-CM

## 2024-06-18 DIAGNOSIS — M549 Dorsalgia, unspecified: Secondary | ICD-10-CM | POA: Diagnosis present

## 2024-06-18 LAB — CBC WITH DIFFERENTIAL/PLATELET
Abs Immature Granulocytes: 0.04 K/uL (ref 0.00–0.07)
Basophils Absolute: 0 K/uL (ref 0.0–0.1)
Basophils Relative: 0 %
Eosinophils Absolute: 0.1 K/uL (ref 0.0–0.5)
Eosinophils Relative: 1 %
HCT: 46.4 % (ref 39.0–52.0)
Hemoglobin: 15.3 g/dL (ref 13.0–17.0)
Immature Granulocytes: 0 %
Lymphocytes Relative: 24 %
Lymphs Abs: 3.3 K/uL (ref 0.7–4.0)
MCH: 32.3 pg (ref 26.0–34.0)
MCHC: 33 g/dL (ref 30.0–36.0)
MCV: 98.1 fL (ref 80.0–100.0)
Monocytes Absolute: 0.7 K/uL (ref 0.1–1.0)
Monocytes Relative: 5 %
Neutro Abs: 9.5 K/uL — ABNORMAL HIGH (ref 1.7–7.7)
Neutrophils Relative %: 70 %
Platelets: 296 K/uL (ref 150–400)
RBC: 4.73 MIL/uL (ref 4.22–5.81)
RDW: 13.2 % (ref 11.5–15.5)
WBC: 13.6 K/uL — ABNORMAL HIGH (ref 4.0–10.5)
nRBC: 0 % (ref 0.0–0.2)

## 2024-06-18 LAB — BASIC METABOLIC PANEL WITH GFR
Anion gap: 12 (ref 5–15)
BUN: 9 mg/dL (ref 6–20)
CO2: 27 mmol/L (ref 22–32)
Calcium: 9 mg/dL (ref 8.9–10.3)
Chloride: 102 mmol/L (ref 98–111)
Creatinine, Ser: 0.97 mg/dL (ref 0.61–1.24)
GFR, Estimated: >60 mL/min (ref >60)
Glucose, Bld: 127 mg/dL — ABNORMAL HIGH (ref 70–99)
Potassium: 4.5 mmol/L (ref 3.5–5.1)
Sodium: 141 mmol/L (ref 135–145)

## 2024-06-18 LAB — PROTIME-INR
INR: 1 (ref 0.8–1.2)
Prothrombin Time: 13.5 s (ref 11.4–15.2)

## 2024-06-18 MED ORDER — KETOROLAC TROMETHAMINE 15 MG/ML IJ SOLN
15.0000 mg | Freq: Once | INTRAMUSCULAR | Status: AC
  Administered 2024-06-18: 15 mg via INTRAVENOUS
  Filled 2024-06-18: qty 1

## 2024-06-18 MED ORDER — DEXAMETHASONE SODIUM PHOSPHATE 10 MG/ML IJ SOLN
10.0000 mg | Freq: Once | INTRAMUSCULAR | Status: DC
  Filled 2024-06-18: qty 1

## 2024-06-18 NOTE — ED Notes (Signed)
 Pt states he is supposed to have surgery on his back but has to do stress test first on the 18th.

## 2024-06-18 NOTE — ED Notes (Signed)
 Pt updated on delay

## 2024-06-18 NOTE — ED Triage Notes (Signed)
 Pt c/o upper and lower back pain and tingling in legs bilaterally x1 week. Pt denies acute injury. Pt able to walk w/ pain. Pt AOX4, CMS intact

## 2024-06-18 NOTE — Discharge Instructions (Signed)
 Please follow-up with Dr. Claudene, your neurosurgeon.  Please return for any new, worsening, or change in symptoms or other concerns.  It was a pleasure caring for you today.

## 2024-06-18 NOTE — ED Provider Notes (Signed)
 [facility] Provider Note    Event Date/Time   First MD Initiated Contact with Patient 06/18/24 1208     (approximate)   History   Back Pain   HPI  Dalton Pena is a 43 y.o. male with a past medical history of pulmonary embolism on anticoagulation, thoracic spondylosis with myelopathy who presents today for acute on chronic back pain and paresthesias in his extremities.  He reports that his left leg is worse than his right leg.  He reports that he had a fall in the shower yesterday and landed on his right side, though has pain in his left knee.  He reports that usually he has paresthesias in his leg with walking only, but today he has pain and paresthesias in his legs at rest as well.  He has not had any urinary or fecal incontinence or retention.  He reports that he has not seen his neurosurgeon since April.  He reports that he has not yet made an appointment with the cardiologist as recommended because he does not have a ride.  He is able to walk but has pain with walking  Patient Active Problem List   Diagnosis Date Noted   Thoracic spondylosis with myelopathy 01/19/2024   Acute respiratory failure with hypoxia (HCC) 01/24/2021   Tobacco abuse 01/24/2021   Abdominal pain 01/24/2021   Nausea vomiting and diarrhea 01/24/2021   Chest pain 01/24/2021   Chronic obstructive pulmonary disease (COPD) (HCC) 01/24/2021   Acute deep vein thrombosis (DVT) of right femoral vein (HCC) 03/15/2020   Recurrent pulmonary emboli (HCC) 03/15/2020   Obesity, Class III, BMI 40-49.9 (morbid obesity) 03/15/2020   Acute gastroenteritis 03/15/2020   Acute pulmonary embolism (HCC) 03/15/2020   Pulmonary embolism (HCC) 03/15/2020   Acute deep vein thrombosis (DVT) of calf muscle vein of right lower extremity (HCC)           Physical Exam   Triage Vital Signs: ED Triage Vitals  Encounter Vitals Group     BP 06/18/24 1123 134/88     Girls Systolic BP Percentile --       Girls Diastolic BP Percentile --      Boys Systolic BP Percentile --      Boys Diastolic BP Percentile --      Pulse Rate 06/18/24 1123 69     Resp 06/18/24 1123 16     Temp 06/18/24 1123 98.6 F (37 C)     Temp Source 06/18/24 1123 Oral     SpO2 06/18/24 1123 96 %     Weight --      Height --      Head Circumference --      Peak Flow --      Pain Score 06/18/24 1122 10     Pain Loc --      Pain Education --      Exclude from Growth Chart --     Most recent vital signs: Vitals:   06/18/24 1205 06/18/24 1542  BP:  128/84  Pulse:  72  Resp:  18  Temp:    SpO2: 100% 100%    Physical Exam Vitals and nursing note reviewed.  Constitutional:      General: Awake and alert. No acute distress.    Appearance: Normal appearance. Dalton Pena  HENT:     Head: Normocephalic and atraumatic.     Mouth: Mucous membranes are moist.  Eyes:     General: PERRL. Normal EOMs  Right eye: No discharge.        Left eye: No discharge.     Conjunctiva/sclera: Conjunctivae normal.  Cardiovascular:     Rate and Rhythm: Normal rate and regular rhythm.     Pulses: Normal pulses.  Pulmonary:     Effort: Pulmonary effort is normal. No respiratory distress.     Breath sounds: Normal breath sounds.  Abdominal:     Abdomen is soft. There is no abdominal tenderness. No rebound or guarding. No distention. Musculoskeletal:        General: No swelling. Normal range of motion.     Cervical back: Normal range of motion and neck supple.  Back: Diffuse thoracic and lumbar tenderness. Strength and sensation intact to bilateral lower extremities. Normal great toe extension against resistance. Normal sensation throughout feet.  I do not appreciate clonus.  Discomfort with bilateral straight leg raise, though able to lift both legs up off of the stretcher without assistance.   Normal-appearing left knee though tenderness palpation to the lateral aspect of knee.  Able to perform a straight leg raise.  No open  wounds.  No effusion.  No warmth or erythema noted. skin:    General: Skin is warm and dry.     Capillary Refill: Capillary refill takes less than 2 seconds.     Findings: No rash.  Neurological:     Mental Status: The patient is awake and alert.      ED Results / Procedures / Treatments   Labs (all labs ordered are listed, but only abnormal results are displayed) Labs Reviewed  BASIC METABOLIC PANEL WITH GFR - Abnormal; Notable for the following components:      Result Value   Glucose, Bld 127 (*)    All other components within normal limits  CBC WITH DIFFERENTIAL/PLATELET - Abnormal; Notable for the following components:   WBC 13.6 (*)    Neutro Abs 9.5 (*)    All other components within normal limits  PROTIME-INR     EKG     RADIOLOGY I independently reviewed and interpreted imaging and agree with radiologists findings.     PROCEDURES:  Critical Care performed:   Procedures   MEDICATIONS ORDERED IN ED: Medications  dexamethasone  (DECADRON ) injection 10 mg (10 mg Intravenous Not Given 06/18/24 1249)  ketorolac  (TORADOL ) 15 MG/ML injection 15 mg (15 mg Intravenous Given 06/18/24 1248)     IMPRESSION / MDM / ASSESSMENT AND PLAN / ED COURSE  I reviewed the triage vital signs and the nursing notes.   Differential diagnosis includes, but is not limited to, acute on chronic back pain, worsening myelopathy, epidural hematoma, knee injury.  I reviewed the patient's chart.  Patient was seen 4 days ago with the same complaint.  He left before treatment was completed.  He was also seen in April of this year at which time he underwent MRI.  Patient followed up with neurosurgery on 02/01/2024 for his multisegment thoracic myelopathy, and they are planning on decompressing and fusing given his progressive neurologic symptoms.  However he has a complex cardiac and medical history including anticoagulation for PE.  His symptoms have been ongoing since January 2024.  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  Patient is awake and alert.  He is refusing all treatment upon  arrival, and when I asked him what would be most helpful for him today he says figure out what is wrong with my back.  I explained to him the findings on his previous MRI, as well as reiterated the discussion that he had with his neurosurgeon, and explained to him that this is my reasoning for getting a another MRI today to see if his disease process has progressed given his worsening symptoms.  He then agrees to undergo MRI.  MRI reveals left paramedian disc protrusion at the T5-T6 which effaces the ventral CSF and contacts the cord.  The foramina are patent.  He has multilevel bilateral foraminal stenosis throughout the thoracic spine.  He also has a left paramedian disc protrusion at L5-S1 with an annular tear, demonstrates slight progression with left greater than right subarticular narrowing.  I consulted Dr. Clois with neurosurgery.  Given that the patient is able to ambulate, he is appropriate for discharge home, and Dr. Clois will coordinate with Dr. Claudene regarding outpatient follow-up.  Given that patient fell onto his left knee, x-ray of his knee was also obtained which was negative for any acute findings.  Patient is comfortable with plan for discharge home.  He did not wish to have any further pain medication.  Patient's presentation is most consistent with acute presentation with potential threat to life or bodily function.   Clinical Course as of 06/18/24 1733  Sat Jun 18, 2024  1500 Discussed with Dr. Clois [JP]  1520 Given that patient is able to ambulate, he is appropriate for outpatient follow-up.  Dr. Clois will coordinate with Dr.  Claudene [JP]    Clinical Course User Index [JP] Clyda Smyth, Andriette BRAVO, PA-C     FINAL CLINICAL IMPRESSION(S) / ED DIAGNOSES   Final diagnoses:  Chronic bilateral thoracic back pain  Chronic left-sided low back pain with left-sided sciatica  Annular tear of lumbar disc  Acute pain of left knee     Rx / DC Orders   ED Discharge Orders     None        Note:  This document was prepared using Dragon voice recognition software and may include unintentional dictation errors.   Tyrice Hewitt E, PA-C 06/18/24 1733    Ernest Ronal BRAVO, MD 06/19/24 1113

## 2024-06-22 ENCOUNTER — Encounter: Payer: Self-pay | Admitting: Neurosurgery

## 2024-06-22 ENCOUNTER — Ambulatory Visit (INDEPENDENT_AMBULATORY_CARE_PROVIDER_SITE_OTHER): Admitting: Neurosurgery

## 2024-06-22 DIAGNOSIS — M4714 Other spondylosis with myelopathy, thoracic region: Secondary | ICD-10-CM

## 2024-06-23 ENCOUNTER — Encounter: Admission: RE | Admit: 2024-06-23 | Source: Ambulatory Visit

## 2024-06-23 NOTE — Progress Notes (Signed)
 I was unable to reach the patient or they are listed alternative contact yesterday.  Did not have voicemail available.  I sent the patient an inbox message to see if we would be able to reschedule.

## 2024-06-26 ENCOUNTER — Emergency Department

## 2024-06-26 ENCOUNTER — Emergency Department
Admission: EM | Admit: 2024-06-26 | Discharge: 2024-06-27 | Disposition: A | Discharge status: Other Acute Inpatient Hospital | Attending: Emergency Medicine | Admitting: Emergency Medicine

## 2024-06-26 ENCOUNTER — Other Ambulatory Visit: Payer: Self-pay

## 2024-06-26 DIAGNOSIS — S5002XA Contusion of left elbow, initial encounter: Secondary | ICD-10-CM | POA: Insufficient documentation

## 2024-06-26 DIAGNOSIS — R45850 Homicidal ideations: Secondary | ICD-10-CM | POA: Diagnosis not present

## 2024-06-26 DIAGNOSIS — Z7901 Long term (current) use of anticoagulants: Secondary | ICD-10-CM | POA: Diagnosis not present

## 2024-06-26 DIAGNOSIS — F322 Major depressive disorder, single episode, severe without psychotic features: Secondary | ICD-10-CM

## 2024-06-26 DIAGNOSIS — W19XXXA Unspecified fall, initial encounter: Secondary | ICD-10-CM | POA: Diagnosis not present

## 2024-06-26 DIAGNOSIS — J449 Chronic obstructive pulmonary disease, unspecified: Secondary | ICD-10-CM | POA: Insufficient documentation

## 2024-06-26 DIAGNOSIS — R45851 Suicidal ideations: Secondary | ICD-10-CM | POA: Diagnosis not present

## 2024-06-26 DIAGNOSIS — F1290 Cannabis use, unspecified, uncomplicated: Secondary | ICD-10-CM | POA: Diagnosis not present

## 2024-06-26 DIAGNOSIS — F1210 Cannabis abuse, uncomplicated: Secondary | ICD-10-CM

## 2024-06-26 LAB — COMPREHENSIVE METABOLIC PANEL WITH GFR
ALT: 24 U/L (ref 0–44)
AST: 22 U/L (ref 15–41)
Albumin: 4 g/dL (ref 3.5–5.0)
Alkaline Phosphatase: 53 U/L (ref 38–126)
Anion gap: 12 (ref 5–15)
BUN: 8 mg/dL (ref 6–20)
CO2: 26 mmol/L (ref 22–32)
Calcium: 8.9 mg/dL (ref 8.9–10.3)
Chloride: 101 mmol/L (ref 98–111)
Creatinine, Ser: 1.07 mg/dL (ref 0.61–1.24)
GFR, Estimated: >60 mL/min (ref >60)
Glucose, Bld: 99 mg/dL (ref 70–99)
Potassium: 3.4 mmol/L — ABNORMAL LOW (ref 3.5–5.1)
Sodium: 139 mmol/L (ref 135–145)
Total Bilirubin: 0.6 mg/dL (ref 0.0–1.2)
Total Protein: 7.6 g/dL (ref 6.5–8.1)

## 2024-06-26 LAB — CBC
HCT: 48.1 % (ref 39.0–52.0)
Hemoglobin: 16.4 g/dL (ref 13.0–17.0)
MCH: 33.7 pg (ref 26.0–34.0)
MCHC: 34.1 g/dL (ref 30.0–36.0)
MCV: 99 fL (ref 80.0–100.0)
Platelets: 332 K/uL (ref 150–400)
RBC: 4.86 MIL/uL (ref 4.22–5.81)
RDW: 13.2 % (ref 11.5–15.5)
WBC: 13 K/uL — ABNORMAL HIGH (ref 4.0–10.5)
nRBC: 0 % (ref 0.0–0.2)

## 2024-06-26 LAB — URINE DRUG SCREEN, QUALITATIVE (ARMC ONLY)
Amphetamines, Ur Screen: NOT DETECTED
Barbiturates, Ur Screen: NOT DETECTED
Benzodiazepine, Ur Scrn: NOT DETECTED
Cannabinoid 50 Ng, Ur ~~LOC~~: POSITIVE — AB
Cocaine Metabolite,Ur ~~LOC~~: NOT DETECTED
MDMA (Ecstasy)Ur Screen: NOT DETECTED
Methadone Scn, Ur: NOT DETECTED
Opiate, Ur Screen: NOT DETECTED
Phencyclidine (PCP) Ur S: NOT DETECTED
Tricyclic, Ur Screen: NOT DETECTED

## 2024-06-26 LAB — ETHANOL: Alcohol, Ethyl (B): 38 mg/dL — ABNORMAL HIGH (ref <15)

## 2024-06-26 LAB — SALICYLATE LEVEL: Salicylate Lvl: <7 mg/dL — ABNORMAL LOW (ref 7.0–30.0)

## 2024-06-26 LAB — ACETAMINOPHEN LEVEL: Acetaminophen (Tylenol), Serum: <10 ug/mL — ABNORMAL LOW (ref 10–30)

## 2024-06-26 MED ORDER — RIVAROXABAN 20 MG PO TABS
20.0000 mg | ORAL_TABLET | Freq: Every day | ORAL | Status: DC
  Administered 2024-06-27: 20 mg via ORAL
  Filled 2024-06-26: qty 1

## 2024-06-26 MED ORDER — UMECLIDINIUM BROMIDE 62.5 MCG/ACT IN AEPB
1.0000 | INHALATION_SPRAY | Freq: Every day | RESPIRATORY_TRACT | Status: DC
  Filled 2024-06-26: qty 7

## 2024-06-26 MED ORDER — ALBUTEROL SULFATE HFA 108 (90 BASE) MCG/ACT IN AERS: 2.0000 | INHALATION_SPRAY | RESPIRATORY_TRACT | Status: DC | PRN

## 2024-06-26 NOTE — ED Notes (Signed)
 ED Provider at bedside.

## 2024-06-26 NOTE — ED Notes (Signed)
 Pt calm and tearful at this time. RN introduced self to pt. Explained how the ED/Psych team assessment works. RN asked pt what brought them in today. Pt stated that he has just not felt himself. He is scared he might hurts someone. He stated that he doesn't care what happens  but that he doesn't want to disappoint his 41 yr old daughter. That she is his life. RN asked pt if anything had happened to trigger these feelings, he shrugged and didn't give any specific details.

## 2024-06-26 NOTE — ED Notes (Signed)
Lab called to add on urinalysis. 

## 2024-06-26 NOTE — ED Notes (Addendum)
 Pt belongings:  Black tank top Camo shorts Black socks Black shoes Cell phone Black belt Blue underwear

## 2024-06-26 NOTE — ED Provider Notes (Incomplete)
 [facility] Provider Note    Event Date/Time   First MD Initiated Contact with Patient 06/26/24 2302     (approximate)   History   Psychiatric Evaluation   HPI  Dalton Pena is a 43 y.o. male with history of PE and DVT on Xarelto , COPD, thoracic spinal stenosis who presents to the emergency department with complaints of psychiatric issues.  Patient is very vague with his reports of what is going on but has stated that he is not doing well and he feels he needs inpatient psychiatric treatment.  He is not able to tell us  if he is suicidal.  He has told nursing staff that he is afraid he is going to hurt someone.  He denies any drug or alcohol use.  He does report since he was diagnosed with thoracic spinal stenosis in April he has had some difficulty with balance and has had falls.  He did fall today injuring his left elbow.  Did not hit his head.  No new neck or back pain.  He denies any new changes with balance.  States this has been stable.  States they are supposed to do surgery as an outpatient.  No numbness, tingling, focal weakness, bowel or bladder incontinence, urinary retention.  On exam, he is also tender throughout his abdomen.  He states this has been ongoing for years and that he has chronic diarrhea.  No fevers, vomiting, urinary symptoms.  1:34 AM  IVC papers brought in by [facility].  Taken out by patient's sister.  Respondent has been hospitalized in the past but sister states it has been over 20 years ago.  Respondent is diagnosed with ADHD.  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  Dalton Pena reports respondent has been having some mental health issues for a month or so now and is becoming worse since his fiance left.  Tonight respondent called Dalton Pena saying he is about to snap.  When trying to calm responded down he threatened to kill her and call her out of her name.  Dalton Pena  states family has tried talking with him and reasoning with him but it is if he just does not understand what they are saying.  Respondent continues to say he wants his family back.  Dalton Pena reports that paperwork was filled out the other day for an IVC but sheriff's office could not serve it in time.   History provided by patient.    Past Medical History:  Diagnosis Date   Deep vein thrombosis (DVT) (HCC)    Pulmonary embolism (HCC)     History reviewed. No pertinent surgical history.  MEDICATIONS:  Prior to Admission medications   Medication Sig Start Date End Date Taking? Authorizing Provider  albuterol  (VENTOLIN  HFA) 108 (90 Base) MCG/ACT inhaler Inhale 2 puffs into the lungs. 02/05/24   [provider]  rivaroxaban  (XARELTO ) 20 MG TABS tablet Take 1 tablet (20 mg total) by mouth daily with supper. 01/19/24   Willo Dunnings, MD  tiZANidine  (ZANAFLEX ) 4 MG tablet TAKE 1 TABLET (4 MG TOTAL) BY MOUTH EVERY 8 (EIGHT) HOURS AS NEEDED FOR MUSCLE SPASMS 05/02/24 05/02/25  Ulis Bottcher, PA-C  umeclidinium bromide  (INCRUSE ELLIPTA ) 62.5 MCG/ACT AEPB Inhale 1 puff into the lungs daily. 02/05/24   [provider]    Physical Exam   Triage Vital Signs: ED Triage Vitals  Encounter Vitals Group     BP 06/26/24 2225 (!) 140/80  Girls Systolic BP Percentile --      Girls Diastolic BP Percentile --      Boys Systolic BP Percentile --      Boys Diastolic BP Percentile --      Pulse Rate 06/26/24 2225 88     Resp 06/26/24 2225 20     Temp 06/26/24 2225 98.3 F (36.8 C)     Temp src --      SpO2 06/26/24 2225 95 %     Weight 06/26/24 2224 (!) 309 lb (140.2 kg)     Height 06/26/24 2224 5' 5 (1.651 m)     Head Circumference --      Peak Flow --      Pain Score 06/26/24 2224 0     Pain Loc --      Pain Education --      Exclude from Growth Chart --     Most recent vital signs: Vitals:   06/26/24 2225  BP: (!) 140/80  Pulse: 88  Resp: 20  Temp: 98.3 F (36.8  C)  SpO2: 95%    CONSTITUTIONAL: Alert, responds appropriately to questions. Well-appearing; well-nourished HEAD: Normocephalic, atraumatic EYES: Conjunctivae clear, pupils appear equal, sclera nonicteric ENT: normal nose; moist mucous membranes NECK: Supple, normal ROM, no midline spinal tenderness or step-off or deformity CARD: RRR; S1 and S2 appreciated RESP: Normal chest excursion without splinting or tachypnea; breath sounds clear and equal bilaterally; no wheezes, no rhonchi, no rales, no hypoxia or respiratory distress, speaking full sentences ABD/GI: Non-distended; soft, mildly tender throughout the abdomen without guarding or rebound BACK: The back appears normal EXT: Normal ROM in all joints; no deformity noted, no edema, mild tenderness over the left elbow without deformity SKIN: Normal color for age and race; warm; no rash on exposed skin NEURO: Moves all extremities equally, normal speech PSYCH: Flat affect.  Very vague about SI, HI.  Not responding to internal stimuli.   ED Results / Procedures / Treatments   LABS: (all labs ordered are listed, but only abnormal results are displayed) Labs Reviewed  COMPREHENSIVE METABOLIC PANEL WITH GFR - Abnormal; Notable for the following components:      Result Value   Potassium 3.4 (*)    All other components within normal limits  ETHANOL - Abnormal; Notable for the following components:   Alcohol, Ethyl (B) 38 (*)    All other components within normal limits  CBC - Abnormal; Notable for the following components:   WBC 13.0 (*)    All other components within normal limits  URINE DRUG SCREEN, QUALITATIVE ([facility] ONLY) - Abnormal; Notable for the following components:   Cannabinoid 50 Ng, Ur [address] POSITIVE (*)    All other components within normal limits  SALICYLATE LEVEL - Abnormal; Notable for the following components:   Salicylate Lvl <7.0 (*)    All other components within normal limits  ACETAMINOPHEN  LEVEL - Abnormal; Notable  for the following components:   Acetaminophen  (Tylenol ), Serum <10 (*)    All other components within normal limits  URINALYSIS, ROUTINE W REFLEX MICROSCOPIC - Abnormal; Notable for the following components:   Color, Urine YELLOW (*)    APPearance CLEAR (*)    Hgb urine dipstick MODERATE (*)    All other components within normal limits  LIPASE, BLOOD     EKG:   RADIOLOGY: My personal review and interpretation of imaging: X-ray shows no fracture.  I have personally reviewed all radiology reports.   DG Elbow Complete Left  Result Date: 06/26/2024 CLINICAL DATA:  Fall with left elbow pain. EXAM: LEFT ELBOW - COMPLETE 3+ VIEW COMPARISON:  None Available. FINDINGS: There is no evidence of fracture, dislocation, or joint effusion. There is no evidence of arthropathy. Olecranon spur is present. Soft tissues are unremarkable. IMPRESSION: 1. No acute fracture or dislocation. 2. Olecranon spur. Electronically Signed   By: Greig Pique M.D.   On: 06/26/2024 23:51     PROCEDURES:  Critical Care performed: Yes, see critical care procedure note(s)   CRITICAL CARE Performed by: Josette Renessa Wellnitz   Total critical care time: 30 minutes  Critical care time was exclusive of separately billable procedures and treating other patients.  Critical care was necessary to treat or prevent imminent or life-threatening deterioration.  Critical care was time spent personally by me on the following activities: development of treatment plan with patient and/or surrogate as well as nursing, discussions with consultants, evaluation of patient's response to treatment, examination of patient, obtaining history from patient or surrogate, ordering and performing treatments and interventions, ordering and review of laboratory studies, ordering and review of radiographic studies, pulse oximetry and re-evaluation of patient's condition.   Procedures    IMPRESSION / MDM / ASSESSMENT AND PLAN / ED COURSE  I reviewed  the triage vital signs and the nursing notes.    Patient here for psychiatric evaluation.  Also reports fall with left elbow injury.  Reports chronic abdominal pain and chronic diarrhea which is unchanged.     DIFFERENTIAL DIAGNOSIS (includes but not limited to):   Depression, anxiety, suicidal and homicidal thoughts  Left elbow contusion, left elbow fracture, doubt dislocation  Chronic abdominal pain may be secondary to IBS, abdominal migraine.  Doubt acute appendicitis, cholecystitis, pancreatitis, bowel obstruction, kidney stone, pyelonephritis.   Patient's presentation is most consistent with acute presentation with potential threat to life or bodily function.   PLAN: Will obtain screening labs, urine.  Will x-ray his left elbow.  He declines anything for pain.  Have reordered his home Xarelto  and inhalers.  Will consult psychiatry, TTS.  At this time he is here voluntarily.   MEDICATIONS GIVEN IN ED: Medications  rivaroxaban  (XARELTO ) tablet 20 mg (has no administration in time range)  albuterol  (VENTOLIN  HFA) 108 (90 Base) MCG/ACT inhaler 2 puff (has no administration in time range)  umeclidinium bromide  (INCRUSE ELLIPTA ) 62.5 MCG/ACT 1 puff (has no administration in time range)     ED COURSE:  [place] in [employer].  Patient reportedly threatening to kill family members.  Will complete my portion of the IVC paperwork.  1:31 AM  Labs show leukocytosis of 13,000.  This appears chronic for patient.  Potassium of 3.4.  Alcohol of 38.  Negative Tylenol , salicylate levels.  Normal LFTs, lipase, urine.  X-ray of the left elbow reviewed and interpreted by myself and the radiologist and is unremarkable.  Patient medically cleared at this time for psychiatric disposition.   CONSULTS: TTS and psychiatry consulted for further disposition.   4:31 AM  [place]  Georgina, NP with psychiatry recommends inpatient psychiatric treatment and I agree.  They will look for  placement.  OUTSIDE RECORDS REVIEWED: Reviewed previous ED and neurosurgical notes.       FINAL CLINICAL IMPRESSION(S) / ED DIAGNOSES   Final diagnoses:  Suicidal ideation  Fall, initial encounter  Homicidal ideation  Contusion of left elbow, initial encounter     Rx / DC Orders   ED Discharge Orders     None  Note:  This document was prepared using Dragon voice recognition software and may include unintentional dictation errors.   Melton Walls, Josette SAILOR, DO 06/27/24 0424    Ramaj Frangos, Josette SAILOR, DO 06/27/24 [phone]

## 2024-06-26 NOTE — ED Triage Notes (Signed)
 Pt reports he has been struggling with his mental health over the past few months. When pt is asked about SI, pt reports I dont know, I dont know what I'm gonna do pt calm and cooperative.

## 2024-06-27 ENCOUNTER — Inpatient Hospital Stay (HOSPITAL_COMMUNITY)
Admission: AD | Admit: 2024-06-27 | Discharge: 2024-07-05 | DRG: 885 | Disposition: A | Discharge status: Home / Self Care | Source: Intra-hospital

## 2024-06-27 ENCOUNTER — Encounter: Payer: Self-pay | Admitting: Psychiatry

## 2024-06-27 ENCOUNTER — Encounter (HOSPITAL_COMMUNITY): Payer: Self-pay | Admitting: Psychiatry

## 2024-06-27 DIAGNOSIS — R45850 Homicidal ideations: Secondary | ICD-10-CM

## 2024-06-27 DIAGNOSIS — Z86718 Personal history of other venous thrombosis and embolism: Secondary | ICD-10-CM | POA: Diagnosis not present

## 2024-06-27 DIAGNOSIS — F332 Major depressive disorder, recurrent severe without psychotic features: Secondary | ICD-10-CM | POA: Diagnosis present

## 2024-06-27 DIAGNOSIS — Z5986 Financial insecurity: Secondary | ICD-10-CM

## 2024-06-27 DIAGNOSIS — F4310 Post-traumatic stress disorder, unspecified: Secondary | ICD-10-CM | POA: Diagnosis present

## 2024-06-27 DIAGNOSIS — Z9152 Personal history of nonsuicidal self-harm: Secondary | ICD-10-CM

## 2024-06-27 DIAGNOSIS — J449 Chronic obstructive pulmonary disease, unspecified: Secondary | ICD-10-CM | POA: Diagnosis present

## 2024-06-27 DIAGNOSIS — G4733 Obstructive sleep apnea (adult) (pediatric): Secondary | ICD-10-CM | POA: Diagnosis present

## 2024-06-27 DIAGNOSIS — F909 Attention-deficit hyperactivity disorder, unspecified type: Secondary | ICD-10-CM | POA: Diagnosis present

## 2024-06-27 DIAGNOSIS — J984 Other disorders of lung: Secondary | ICD-10-CM | POA: Diagnosis present

## 2024-06-27 DIAGNOSIS — M4804 Spinal stenosis, thoracic region: Secondary | ICD-10-CM | POA: Diagnosis present

## 2024-06-27 DIAGNOSIS — Z62811 Personal history of psychological abuse in childhood: Secondary | ICD-10-CM

## 2024-06-27 DIAGNOSIS — F1290 Cannabis use, unspecified, uncomplicated: Secondary | ICD-10-CM | POA: Diagnosis present

## 2024-06-27 DIAGNOSIS — R45851 Suicidal ideations: Secondary | ICD-10-CM

## 2024-06-27 DIAGNOSIS — D72829 Elevated white blood cell count, unspecified: Secondary | ICD-10-CM | POA: Diagnosis present

## 2024-06-27 DIAGNOSIS — Z560 Unemployment, unspecified: Secondary | ICD-10-CM

## 2024-06-27 DIAGNOSIS — Z833 Family history of diabetes mellitus: Secondary | ICD-10-CM | POA: Diagnosis not present

## 2024-06-27 DIAGNOSIS — F17210 Nicotine dependence, cigarettes, uncomplicated: Secondary | ICD-10-CM | POA: Diagnosis present

## 2024-06-27 DIAGNOSIS — Z7901 Long term (current) use of anticoagulants: Secondary | ICD-10-CM

## 2024-06-27 DIAGNOSIS — Z79899 Other long term (current) drug therapy: Secondary | ICD-10-CM | POA: Diagnosis not present

## 2024-06-27 DIAGNOSIS — Z981 Arthrodesis status: Secondary | ICD-10-CM

## 2024-06-27 DIAGNOSIS — F411 Generalized anxiety disorder: Secondary | ICD-10-CM | POA: Diagnosis present

## 2024-06-27 DIAGNOSIS — Z6841 Body Mass Index (BMI) 40.0 and over, adult: Secondary | ICD-10-CM

## 2024-06-27 DIAGNOSIS — F17290 Nicotine dependence, other tobacco product, uncomplicated: Secondary | ICD-10-CM | POA: Diagnosis present

## 2024-06-27 DIAGNOSIS — M4304 Spondylolysis, thoracic region: Secondary | ICD-10-CM | POA: Diagnosis present

## 2024-06-27 DIAGNOSIS — Z86711 Personal history of pulmonary embolism: Secondary | ICD-10-CM

## 2024-06-27 DIAGNOSIS — I10 Essential (primary) hypertension: Secondary | ICD-10-CM | POA: Diagnosis present

## 2024-06-27 DIAGNOSIS — Z9151 Personal history of suicidal behavior: Secondary | ICD-10-CM | POA: Diagnosis not present

## 2024-06-27 DIAGNOSIS — F322 Major depressive disorder, single episode, severe without psychotic features: Secondary | ICD-10-CM

## 2024-06-27 DIAGNOSIS — Z62810 Personal history of physical and sexual abuse in childhood: Secondary | ICD-10-CM

## 2024-06-27 DIAGNOSIS — Z8249 Family history of ischemic heart disease and other diseases of the circulatory system: Secondary | ICD-10-CM | POA: Diagnosis not present

## 2024-06-27 DIAGNOSIS — E669 Obesity, unspecified: Secondary | ICD-10-CM | POA: Diagnosis present

## 2024-06-27 DIAGNOSIS — F1210 Cannabis abuse, uncomplicated: Secondary | ICD-10-CM

## 2024-06-27 LAB — URINALYSIS, ROUTINE W REFLEX MICROSCOPIC
Bacteria, UA: NONE SEEN
Bilirubin Urine: NEGATIVE
Glucose, UA: NEGATIVE mg/dL
Ketones, ur: NEGATIVE mg/dL
Leukocytes,Ua: NEGATIVE
Nitrite: NEGATIVE
Protein, ur: NEGATIVE mg/dL
Specific Gravity, Urine: 1.008 (ref 1.005–1.030)
pH: 5 (ref 5.0–8.0)

## 2024-06-27 LAB — LIPASE, BLOOD: Lipase: 31 U/L (ref 11–51)

## 2024-06-27 MED ORDER — LORAZEPAM 2 MG/ML IJ SOLN: 2.0000 mg | Freq: Three times a day (TID) | INTRAMUSCULAR | Status: DC | PRN

## 2024-06-27 MED ORDER — ALUM & MAG HYDROXIDE-SIMETH 200-200-20 MG/5ML PO SUSP: 30.0000 mL | ORAL | Status: DC | PRN

## 2024-06-27 MED ORDER — HYDROXYZINE HCL 25 MG PO TABS
25.0000 mg | ORAL_TABLET | Freq: Four times a day (QID) | ORAL | Status: DC | PRN
  Administered 2024-07-04 (×2): 25 mg via ORAL
  Filled 2024-06-27 (×2): qty 1

## 2024-06-27 MED ORDER — UMECLIDINIUM BROMIDE 62.5 MCG/ACT IN AEPB
1.0000 | INHALATION_SPRAY | Freq: Every day | RESPIRATORY_TRACT | Status: DC
  Filled 2024-06-27 (×2): qty 7

## 2024-06-27 MED ORDER — HALOPERIDOL LACTATE 5 MG/ML IJ SOLN: 10.0000 mg | Freq: Three times a day (TID) | INTRAMUSCULAR | Status: DC | PRN

## 2024-06-27 MED ORDER — DIPHENHYDRAMINE HCL 50 MG/ML IJ SOLN: 50.0000 mg | Freq: Three times a day (TID) | INTRAMUSCULAR | Status: DC | PRN

## 2024-06-27 MED ORDER — ACETAMINOPHEN 325 MG PO TABS
650.0000 mg | ORAL_TABLET | Freq: Four times a day (QID) | ORAL | Status: DC | PRN
  Administered 2024-06-30: 650 mg via ORAL
  Filled 2024-06-27: qty 2

## 2024-06-27 MED ORDER — DIPHENHYDRAMINE HCL 25 MG PO CAPS: 25.0000 mg | ORAL_CAPSULE | Freq: Four times a day (QID) | ORAL | Status: DC | PRN

## 2024-06-27 MED ORDER — MAGNESIUM HYDROXIDE 400 MG/5ML PO SUSP: 30.0000 mL | Freq: Every day | ORAL | Status: DC | PRN

## 2024-06-27 MED ORDER — HALOPERIDOL LACTATE 5 MG/ML IJ SOLN: 5.0000 mg | Freq: Three times a day (TID) | INTRAMUSCULAR | Status: DC | PRN

## 2024-06-27 MED ORDER — HALOPERIDOL 5 MG PO TABS
5.0000 mg | ORAL_TABLET | Freq: Three times a day (TID) | ORAL | Status: DC | PRN
  Administered 2024-06-29: 5 mg via ORAL
  Filled 2024-06-27: qty 1

## 2024-06-27 MED ORDER — ESCITALOPRAM OXALATE 10 MG PO TABS
5.0000 mg | ORAL_TABLET | Freq: Every day | ORAL | Status: DC
  Administered 2024-06-27: 5 mg via ORAL
  Filled 2024-06-27: qty 1

## 2024-06-27 MED ORDER — ESCITALOPRAM OXALATE 5 MG PO TABS
5.0000 mg | ORAL_TABLET | Freq: Every day | ORAL | Status: DC
  Administered 2024-06-28: 5 mg via ORAL
  Filled 2024-06-27: qty 1

## 2024-06-27 MED ORDER — DIPHENHYDRAMINE HCL 25 MG PO CAPS
50.0000 mg | ORAL_CAPSULE | Freq: Three times a day (TID) | ORAL | Status: DC | PRN
  Administered 2024-06-29: 50 mg via ORAL
  Filled 2024-06-27: qty 2

## 2024-06-27 MED ORDER — ALBUTEROL SULFATE (2.5 MG/3ML) 0.083% IN NEBU: 3.0000 mL | INHALATION_SOLUTION | RESPIRATORY_TRACT | Status: DC | PRN

## 2024-06-27 MED ORDER — HALOPERIDOL 0.5 MG PO TABS
0.5000 mg | ORAL_TABLET | Freq: Four times a day (QID) | ORAL | Status: DC | PRN
  Filled 2024-06-27: qty 1

## 2024-06-27 MED ORDER — TRAZODONE HCL 50 MG PO TABS
50.0000 mg | ORAL_TABLET | Freq: Every evening | ORAL | Status: DC | PRN
  Administered 2024-07-04: 50 mg via ORAL
  Filled 2024-06-27 (×2): qty 1

## 2024-06-27 MED ORDER — RIVAROXABAN 20 MG PO TABS
20.0000 mg | ORAL_TABLET | Freq: Every day | ORAL | Status: DC
Start: 2024-06-28 — End: 2024-07-05
  Administered 2024-06-28 – 2024-07-04 (×7): 20 mg via ORAL
  Filled 2024-06-27 (×7): qty 1

## 2024-06-27 NOTE — ED Notes (Signed)
 Pt on with Tele Psych consult.

## 2024-06-27 NOTE — ED Notes (Signed)
 [place] here with IVC paperwork for pt. ED provider and secretary notified.

## 2024-06-27 NOTE — Tx Team (Signed)
 Initial Treatment Plan 06/27/2024 11:31 PM Dalton Pena FMW:[id number]    PATIENT STRESSORS: Financial difficulties   Health problems   Marital or family conflict     PATIENT STRENGTHS: Other: Pt declined strengths. Angry and agitated.    PATIENT IDENTIFIED PROBLEMS: Pt was noted having a gun and small child. Pt took child and gun to parents home because afraid of what he would do. Pt recently broke up with significant other.                     DISCHARGE CRITERIA:  Ability to meet basic life and health needs Adequate post-discharge living arrangements Improved stabilization in mood, thinking, and/or behavior Motivation to continue treatment in a less acute level of care Reduction of life-threatening or endangering symptoms to within safe limits Safe-care adequate arrangements made  PRELIMINARY DISCHARGE PLAN: Attend PHP/IOP Outpatient therapy Participate in family therapy  PATIENT/FAMILY INVOLVEMENT: This treatment plan has been presented to and reviewed with the patient, Dalton Pena.  The patient given the opportunity to ask questions and make suggestions.  Bronwyn ONEIDA Sharps, RN 06/27/2024, 11:31 PM

## 2024-06-27 NOTE — ED Notes (Signed)
 Hospital meal provided, pt tolerated w/o complaints.  Waste discarded appropriately.

## 2024-06-27 NOTE — ED Notes (Signed)
[place]  called  for  transport  to [facility] 

## 2024-06-27 NOTE — Progress Notes (Incomplete)
 Patient ID: Dalton Pena, male   DOB: 06-28-1981, 43 y.o.   MRN: [id number]  1953 Pt ambulated onto the Unit with a steady gait. Pt A/O x4 with noted MDD, Angry, and Anxiety. Pt was IVC to the faciltiy due to feelings of hopelessness, helplessness, and unloved feelings R/T recent break up with significant other. Pt mood: Angry/Frustrated. Pt denied SI/HI; A/V/H. Meds whole. Food/Fluid offered. ADL's self. Cont of B/B LBM: 06/27/24. Skin assessed with no noted issues. Skin warm to touch and dry intact. Pt has Hx: embolism and medication given as ordered

## 2024-06-27 NOTE — Progress Notes (Signed)
 Patient ID: Dalton Pena, male   DOB: [birth year], 43 y.o.   MRN: [id number]  1953 Pt ambulated onto the Unit with a steady gait. Pt A/O x4 with noted MDD, Angry, and Anxiety. Pt was IVC to the facility due to feelings of hopelessness, helplessness, and unloved feelings R/T recent break up with significant other and engagement called off. That is when Pt expressed thoughts of SI with a plan. Pt had a gun and his young child. He then took the gun and child to his parents and reached out for help. Pt mood: Angry/Frustrated. Pt currently denied SI/HI; A/V/H. Meds whole. Food/Fluid offered. Pt denied. ADL's self. Cont of B/B LBM: 06/27/24. Skin assessed with no noted issues. Skin warm to touch and dry intact. Pt has Hx: embolism and Xarelto  medication given as ordered. Pt positive for THC. Pt is adjusting well to Unit. No noted distress or C/O pain. Monitoring continues during 7p-7a.

## 2024-06-27 NOTE — ED Notes (Signed)
Pt was provided breakfast tray

## 2024-06-27 NOTE — Group Note (Signed)
 Date:  06/27/2024 Time:  9:28 PM  Group Topic/Focus:  Wrap-Up Group:   The focus of this group is to help patients review their daily goal of treatment and discuss progress on daily workbooks. Alcoholics Anonymous (AA) Meeting   Participation Level:  Did Not Attend  Participation Quality:  n/a  Affect:  n/a  Cognitive:  n/a  Insight: None  Engagement in Group:  n/a  Modes of Intervention:  n/a  Additional Comments:  Patient did not attend  Eward Mace 06/27/2024, 9:28 PM

## 2024-06-27 NOTE — BH Assessment (Signed)
 Patient has been accepted to [facility] on today 06/27/24.  Patient assigned to room 404, bed# 2. Accepting physician is Dr. Prentis.  Call report to [phone].  Representative was [employer].   ER Staff is aware of it:  Luann, ER Secretary  Dr. Jossie, ER MD  Amber Patient's Nurse

## 2024-06-27 NOTE — ED Notes (Signed)
 IVC/psych consult complete /recommend psych in patient admission
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
## 2024-06-27 NOTE — ED Notes (Signed)
Lunch tray provided for pt

## 2024-06-28 ENCOUNTER — Ambulatory Visit: Payer: Self-pay | Attending: Cardiology | Admitting: Cardiology

## 2024-06-28 DIAGNOSIS — F332 Major depressive disorder, recurrent severe without psychotic features: Secondary | ICD-10-CM

## 2024-06-28 DIAGNOSIS — F411 Generalized anxiety disorder: Secondary | ICD-10-CM | POA: Insufficient documentation

## 2024-06-28 DIAGNOSIS — F4310 Post-traumatic stress disorder, unspecified: Secondary | ICD-10-CM | POA: Insufficient documentation

## 2024-06-28 LAB — LIPID PANEL
Cholesterol: 174 mg/dL (ref 0–200)
HDL: 31 mg/dL — ABNORMAL LOW (ref >40)
LDL Cholesterol: 113 mg/dL — ABNORMAL HIGH (ref 0–99)
Total CHOL/HDL Ratio: 5.5 ratio
Triglycerides: 150 mg/dL — ABNORMAL HIGH (ref <150)
VLDL: 30 mg/dL (ref 0–40)

## 2024-06-28 LAB — HEMOGLOBIN A1C
Hgb A1c MFr Bld: 5.7 % — ABNORMAL HIGH (ref 4.8–5.6)
Mean Plasma Glucose: 116.89 mg/dL

## 2024-06-28 MED ORDER — NICOTINE 14 MG/24HR TD PT24
14.0000 mg | MEDICATED_PATCH | Freq: Every day | TRANSDERMAL | Status: DC
  Administered 2024-06-28 – 2024-06-30 (×3): 14 mg via TRANSDERMAL
  Filled 2024-06-28 (×7): qty 1

## 2024-06-28 MED ORDER — ARIPIPRAZOLE 2 MG PO TABS
2.0000 mg | ORAL_TABLET | Freq: Every day | ORAL | Status: DC
  Administered 2024-06-28 – 2024-06-30 (×3): 2 mg via ORAL
  Filled 2024-06-28 (×3): qty 1

## 2024-06-28 MED ORDER — ESCITALOPRAM OXALATE 10 MG PO TABS
10.0000 mg | ORAL_TABLET | Freq: Every day | ORAL | Status: DC
  Administered 2024-06-29 – 2024-07-05 (×7): 10 mg via ORAL
  Filled 2024-06-28 (×7): qty 1

## 2024-06-28 NOTE — Progress Notes (Signed)
 EKG completed, placed on patient's chart.

## 2024-06-28 NOTE — Group Note (Signed)
 Date:  06/28/2024 Time:  4:39 PM  Group Topic/Focus:  Using Ask Me 3 to Understand Treatment Plan    Participation Level:  Did Not Attend   Dalton Pena 06/28/2024, 4:39 PM

## 2024-06-28 NOTE — Group Note (Signed)
 Recreation Therapy Group Note   Group Topic:Animal Assisted Therapy   Group Date: 06/28/2024 Start Time: 9050 End Time: 1030 Facilitators: Charlita Brian-McCall, LRT,CTRS Location: [address] Dayroom   Animal-Assisted Activity (AAA) Program Checklist/Progress Notes Patient Eligibility Criteria Checklist & Daily Group note for Rec Tx Intervention  AAA/T Program Assumption of Risk Form signed by Patient/ or Parent Legal Guardian Yes  Patient understands his/her participation is voluntary Yes  Behavioral Response:    Education: Charity fundraiser, Appropriate Animal Interaction   Education Outcome: Acknowledges education.    Affect/Mood: N/A   Participation Level: Did not attend    Clinical Observations/Individualized Feedback:      Plan: Continue to engage patient in RT group sessions 2-3x/week.   Leliana Kontz-McCall, LRT,CTRS 06/28/2024 12:30 PM

## 2024-06-28 NOTE — [facility] Group Notes (Signed)
 Psychoeducational Group Note  Date:  06/28/2024 Time:  8:30  Group Topic/Focus:  Goals Group:   The focus of this group is to help patients establish daily goals to achieve during treatment and discuss how the patient can incorporate goal setting into their daily lives to aide in recovery.  Participation Level: Did Not Attend  Participation Quality:  Not Applicable  Affect:  Not Applicable  Cognitive:  Not Applicable  Insight:  Not Applicable  Engagement in Group: Not Applicable  Additional Comments:  Pt did not attend Goal group.  Hayato Guaman, [place] 06/28/2024, 11:12 AM

## 2024-06-28 NOTE — Progress Notes (Addendum)
 Patient denies SI/HI/AVH this morning. Pt rates his depression a 8/10 and anxiety a 8/10. Pt reports that he slept fair last night. Pt complains of 7/10 pain in his back and head today but did not request any pain medication. Pt has an irritable affect but  has been cooperative throughout the day. Patient has been compliant with medications and treatment plan. Q 15 minute safety checks are in place for patient's safety. Patient is currently safe on the unit.   06/28/24 9171  Psych Admission Type (Psych Patients Only)  Admission Status Involuntary  Psychosocial Assessment  Patient Complaints Irritability;Anxiety;Depression  Eye Contact Fair  Facial Expression Flat  Affect Irritable  Speech Logical/coherent  Interaction Minimal;Guarded  Motor Activity Other (Comment) (WDL)  Appearance/Hygiene In scrubs  Behavior Characteristics Irritable  Mood Irritable  Thought Process  Coherency WDL  Content WDL  Delusions None reported or observed  Perception WDL  Hallucination None reported or observed  Judgment WDL  Confusion WDL  Danger to Self  Current suicidal ideation? Denies  Agreement Not to Harm Self Yes  Description of Agreement Pt verbally contracts for safety  Danger to Others  Danger to Others None reported or observed

## 2024-06-28 NOTE — Group Note (Signed)
 LCSW Group Therapy Note   Group Date: 06/28/2024 Start Time: 1100 End Time: 1200   Participation:  did not attend  Type of Therapy:  Group Therapy  Topic:  Title: Finding Balance: Using Wise Mind for Thoughtful Decisions  Objective:  To help participants understand and apply the concept of Delsie Mind to make balanced, thoughtful decisions by integrating emotion and logic.  Goals: Learn the differences between Emotional Mind, Reasonable Mind, and [employer]. Recognize personal signs of Emotional and Reasonable Mind. Practice using [employer] in real-life scenarios.  Therapeutic Modalities: Elements of Dialectical Behavior Therapy (DBT):  Mindfulness (noticing thoughts and emotions without judgment), Emotion Regulation (understanding and managing emotional responses), Distress Tolerance (coping with difficult situations without making them worse), Wise Mind (integrating emotion and reason for balanced decision-making) Elements of Cognitive Behavioral Therapy (CBT):  Identifying automatic thoughts, Challenging cognitive distortions, Using logic to reframe unhelpful thinking patterns  Summary:  This class focused on Wise Mind--DBT's concept of balancing Emotional Mind and Reasonable Mind. We identified when we're in each state and practiced using Wise Mind to respond thoughtfully in real-life situations. By combining emotion and logic, participants can improve decision-making, manage challenges, and enhance relationships.   Gradyn Shein O Ligaya Cormier, LCSWA 06/28/2024  12:22 PM
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
## 2024-06-28 NOTE — Progress Notes (Signed)
   06/27/24 2100  Psych Admission Type (Psych Patients Only)  Admission Status Involuntary  Psychosocial Assessment  Patient Complaints Anger;Anxiety;Depression  Eye Contact Brief  Facial Expression Flat  Affect Angry  Speech Argumentative;Aggressive  Interaction Minimal  Motor Activity Other (Comment) (WNL)  Appearance/Hygiene In scrubs  Behavior Characteristics Agressive verbally  Mood Preoccupied;Irritable  Thought Process  Coherency WDL  Content WDL  Delusions None reported or observed  Perception WDL  Hallucination None reported or observed  Judgment WDL  Confusion WDL  Danger to Self  Current suicidal ideation? Passive (Currently Denies)  Agreement Not to Harm Self Yes  Description of Agreement Notify Staff  Danger to Others  Danger to Others None reported or observed

## 2024-06-28 NOTE — [facility] Group Notes (Signed)
 Adult Psychoeducational Group Note  Date:  06/28/2024 Time:  9:07 PM  Group Topic/Focus:  Wrap-Up Group:   The focus of this group is to help patients review their daily goal of treatment and discuss progress on daily workbooks.  Participation Level:  Active  Participation Quality:  Appropriate  Affect:  Appropriate  Cognitive:  Appropriate  Insight: Appropriate  Engagement in Group:  Engaged  Modes of Intervention:  Discussion  Additional Comments:  Dalton Pena said his day 3.  His goal to be better than he was yester day and he meet the goal. Coping skills helpful stop and listen. Favorite part of the day talking to his daughter  [name] 06/28/2024, 9:07 PM
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
## 2024-06-28 NOTE — Plan of Care (Signed)
   Problem: Education: Goal: Knowledge of [facility] Education information/materials will improve Outcome: Progressing Goal: Emotional status will improve Outcome: Progressing Goal: Mental status will improve Outcome: Progressing Goal: Verbalization of understanding the information provided will improve Outcome: Progressing
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
## 2024-06-29 ENCOUNTER — Encounter (HOSPITAL_COMMUNITY): Payer: Self-pay

## 2024-06-29 DIAGNOSIS — F332 Major depressive disorder, recurrent severe without psychotic features: Secondary | ICD-10-CM | POA: Diagnosis not present

## 2024-06-29 LAB — TSH: TSH: 1.61 u[IU]/mL (ref 0.350–4.500)

## 2024-06-29 MED ORDER — AMLODIPINE BESYLATE 5 MG PO TABS
5.0000 mg | ORAL_TABLET | Freq: Every day | ORAL | Status: DC
  Administered 2024-06-29 – 2024-07-05 (×7): 5 mg via ORAL
  Filled 2024-06-29 (×7): qty 1

## 2024-06-29 NOTE — Progress Notes (Signed)
 Mild agitation protocol administered at 1330. Pt approached Clinical research associate stating that he is feeling restless and irritable and I just need to sleep. Pt has been visibly agitated throughout the morning, refusing vital signs several times. PO Haldol  and Benadryl  50 mg administered. Pt returned to room following medication administration.

## 2024-06-29 NOTE — Group Note (Signed)
 Recreation Therapy Group Note   Group Topic:Team Building  Group Date: 06/29/2024 Start Time: 0930 End Time: 1000 Facilitators: Moranda Billiot-McCall, LRT,CTRS Location: [address] Dayroom   Group Topic: Communication, Team Building, Problem Solving  Goal Area(s) Addresses:  Patient will effectively work with peer towards shared goal.  Patient will identify skills used to make activity successful.  Patient will share challenges and verbalize solution-driven approaches used. Patient will identify how skills used during activity can be used to reach post d/c goals.   Behavioral Response:   Intervention: STEM Activity   Activity: [employer]. Patients were provided the following materials: 5 drinking straws, 5 rubber bands, 5 paper clips, 2 index cards, 2 drinking cups, and 2 toilet paper rolls. Using the provided materials patients were asked to build a launching mechanism to launch a ping pong ball across the room, approximately 10 feet. Patients were divided into teams of 3-5. Instructions required all materials be incorporated into the device, functionality of items left to the peer group's discretion.  Education: Pharmacist, community, Scientist, physiological, Air cabin crew, Building control surveyor.   Education Outcome: Acknowledges education/In group clarification offered/Needs additional education.    Affect/Mood: N/A   Participation Level: Did not attend    Clinical Observations/Individualized Feedback:      Plan: Continue to engage patient in RT group sessions 2-3x/week.   Jillian Pianka-McCall, LRT,CTRS 06/29/2024 12:20 PM

## 2024-06-29 NOTE — Progress Notes (Signed)
(  Sleep Hours) - 10.75 (Any PRNs that were needed, meds refused, or side effects to meds)- None (Any disturbances and when (visitation, over night)- None (Concerns raised by the patient)- None (SI/HI/AVH)- Denied

## 2024-06-29 NOTE — [facility] Suicide Risk Assessment (Signed)
 [facility] INPATIENT:  Family/Significant Other Suicide Prevention Education  Suicide Prevention Education:  Contact Attempts: Dalton Pena) [phone] AND Dalton Dalton Pena [phone], (name of family member/significant other) has been identified by the patient as the family member/significant other with whom the patient will be residing, and identified as the person(s) who will aid the patient in the event of a mental health crisis.  With written consent from the patient, two attempts were made to provide suicide prevention education, prior to and/or following the patient's discharge.  We were unsuccessful in providing suicide prevention education.  A suicide education pamphlet was given to the patient to share with family/significant other.  Date and time of first attempt:  Dalton Pena 9/24 @ 345PM, no response, LVM  Dalton, Dalton 9/24 @ 347PM (unable to leave voicemail)  Date and time of second attempt: NEEDED   Dalton Pena 06/29/2024, 3:48 PM
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
## 2024-06-29 NOTE — Progress Notes (Addendum)
 Patient denies SI/HI/AVH this morning. Pt does endorse feeling anxious and depressed this morning. Pt is guarded and has an irritable affect. Pt has been cooperative throughout the day. Patient has been compliant with medications and treatment plan. Q 15 minute safety checks are in place for patient's safety. Patient is currently safe on the unit.   06/29/24 0935  Psych Admission Type (Psych Patients Only)  Admission Status Involuntary  Psychosocial Assessment  Patient Complaints Anxiety;Depression;Irritability  Eye Contact Fair  Facial Expression Flat  Affect Appropriate to circumstance  Speech Logical/coherent  Interaction Minimal;Guarded  Motor Activity Other (Comment) (WDL)  Appearance/Hygiene Unremarkable  Behavior Characteristics Appropriate to situation;Cooperative  Mood Depressed;Anxious;Irritable  Thought Process  Coherency WDL  Content WDL  Delusions None reported or observed  Perception WDL  Hallucination None reported or observed  Judgment Impaired  Confusion None  Danger to Self  Current suicidal ideation? Denies  Agreement Not to Harm Self Yes  Description of Agreement Pt verbally contracts for safety  Danger to Others  Danger to Others None reported or observed

## 2024-06-29 NOTE — Progress Notes (Signed)
 [place] MD Progress Note  06/29/2024 10:18 AM Dalton Pena  MRN:  [id number]  Principal Problem: MDD (major depressive disorder), recurrent severe, without psychosis (HCC) Diagnosis: Principal Problem:   MDD (major depressive disorder), recurrent severe, without psychosis (HCC) Active Problems:   GAD (generalized anxiety disorder)   PTSD (post-traumatic stress disorder)   Reason for Admission:  Dalton Pena is a 43 y.o. male  with a past psychiatric history of ADHD, MDD, cannabis use, cocaine use disorder. Patient initially arrived to [facility] on 9/21 for threatening to kill sister, and admitted to [facility] under IVC on 9/23 for acute safety concerns, acute suicidal or self-harming behaviors, homicidal behaviors, and severe substance-induced psychosis or mood disturbances. Medically cleared at [facility], leukocytosis of 13 (appears chronic). 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 Acute respiratory failure 2/2 COPD in 2022. Of note, patient was seen in [facility] ED at [facility] for low back pain, complex medical history -- some behavioral dysregulation during tht visit, said that staff were going to kill him. I know people come in and die from something they didn't even come in for.   (admitted on 06/27/2024, total  LOS: 2 days )   Overnight events: 151/84 --> 150/88. No PRNs. Slept 10.75. Denied SI, HI, AVH. NAE. LDL 113. A1c 5.7%. TSH 1.6 WNL.  On interview:  Patient was woken up and tired-appearing on interview in mid-morning. Is going good. Notes he has had better control of his anger after starting Abilify  2 mg on his first day of that medicine. No side effects at present, endorsed some woozy feeling yesterday that has disappeared. Would otherwise have made him angry, but found he could  control these feelings. Spoke with mom and daughter today which raised his spirits. No SI today. Denied HI. No AVH. Eating OK. Discussed his numerous medical issues: amenable to starting amlodipine  5 mg today for elevated blood pressure. Reports his BP is in the 160s at home. Also encouraged sleep study after discharge. Sleeping as well as he typically does at home, likely OSA. Gave permission to call mother, before 1:00 pm. Additionally, fiancee [phone]) can be reached after 3:00 PM  Collateral information via IVC petitioner Diamond Martucci, sister [phone] vs [phone]): Called home number x2, no answer, voicemail not set up. Called second number which was listed on the Affidavit, which routes to her work at [employer], patient was not present at work today and could not be reached.   Patient gave verbal permission to reach out to mother, Rumalda Ada [phone], with no restrictions): Called x2 before 1 pm as instructed.   Past Psychiatric History: Current psychiatrist: None Current therapist: None Previous psychiatric diagnoses: ADHD, MDD, suicide attempt/gesture Current psychiatric medications: None Psychiatric medication history/compliance: None Psychiatric hospitalization(s): Over 20 years ago after overdose on pills as teen.  Psychotherapy history: None Neuromodulation history: None History of suicide (obtained from HPI): suicide attempt vs gesture wrt overdose on pills.  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  Follows with cardiology,  but missed most recent appointment, Medical  diagnoses: DVT/PE, thoracic spinal stenosis,?  Sarcoidosis, concern for heart failure exacerbation, COPD continues to smoke cigarettes Medications: Patient says he is not taking medications at home Allergies: Denies Hospitalizations: Per chart review, hospitalized in 2021 for acute hypoxic respiratory failure in the setting of COPD Surgeries: Multiple, previous Trauma: Denies Seizures: Denies   Social History: Living situation: Mom, daughter and sister Education: Eighth grade Occupational history: Unemployed Marital status: Single Children: Three children Legal: Domestic violence charge, court date in november Military: None   Access to firearms: Denies   Family Psychiatric History: Denies   Family Medical History: None pertinent   Total Time spent with patient: 2.5 hours   Is the patient at risk to self? Yes.    Has the patient been a risk to self in the past 6 months? No.  Has the patient been a risk to self within the distant past? Yes.     Is the patient a risk to others? No.  Has the patient been a risk to others in the past 6 months? No.  Has the patient been a risk to others within the distant past? No.   Current Medications: Current Facility-Administered Medications  Medication Dose Route Frequency Provider Last Rate Last Admin   acetaminophen  (TYLENOL ) tablet 650 mg  650 mg Oral Q6H PRN Donnelly Mellow, MD       albuterol  (PROVENTIL ) (2.5 MG/3ML) 0.083% nebulizer solution 3 mL  3 mL Inhalation Q4H PRN Donnelly Mellow, MD       alum & mag hydroxide-simeth (MAALOX/MYLANTA) 200-200-20 MG/5ML suspension 30 mL  30 mL Oral Q4H PRN Jadapalle, Sree, MD       amLODipine  (NORVASC ) tablet 5 mg  5 mg Oral Daily Rollene Katz, MD       ARIPiprazole  (ABILIFY ) tablet 2 mg  2 mg Oral Daily Rollene Katz, MD   2 mg at 06/29/24 9247   diphenhydrAMINE  (BENADRYL ) capsule 25 mg  25 mg Oral Q6H PRN Jadapalle, Sree, MD       haloperidol  (HALDOL ) tablet 5 mg  5 mg Oral TID PRN  Jadapalle, Sree, MD       And   diphenhydrAMINE  (BENADRYL ) capsule 50 mg  50 mg Oral TID PRN Jadapalle, Sree, MD       haloperidol  lactate (HALDOL ) injection 5 mg  5 mg Intramuscular TID PRN Jadapalle, Sree, MD       And   diphenhydrAMINE  (BENADRYL ) injection 50 mg  50 mg Intramuscular TID PRN Jadapalle, Sree, MD       And   LORazepam  (ATIVAN ) injection 2 mg  2 mg Intramuscular TID PRN Jadapalle, Sree, MD       haloperidol  lactate (HALDOL ) injection 10 mg  10 mg Intramuscular TID PRN Jadapalle, Sree, MD       And   diphenhydrAMINE  (BENADRYL ) injection 50 mg  50 mg Intramuscular TID PRN Jadapalle, Sree, MD       And   LORazepam  (ATIVAN ) injection 2 mg  2 mg Intramuscular TID PRN Jadapalle, Sree, MD       escitalopram  (LEXAPRO ) tablet 10 mg  10 mg Oral Daily Rollene Katz, MD   10 mg at 06/29/24 9247   hydrOXYzine  (ATARAX ) tablet 25 mg  25 mg Oral Q6H PRN Jadapalle, Sree, MD       magnesium  hydroxide (MILK OF MAGNESIA) suspension 30 mL  30 mL Oral Daily PRN Donnelly Mellow, MD       nicotine  (NICODERM CQ  - dosed in  mg/24 hours) patch 14 mg  14 mg Transdermal Daily Zouev, Dmitri, MD   14 mg at 06/29/24 9247   rivaroxaban  (XARELTO ) tablet 20 mg  20 mg Oral Q supper Jadapalle, Sree, MD   20 mg at 06/28/24 1635   traZODone  (DESYREL ) tablet 50 mg  50 mg Oral QHS PRN Jadapalle, Sree, MD       umeclidinium bromide  (INCRUSE ELLIPTA ) 62.5 MCG/ACT 1 puff  1 puff Inhalation Daily Jadapalle, Sree, MD        Lab Results:  Results for orders placed or performed during the hospital encounter of 06/27/24 (from the past 48 hours)  Hemoglobin A1c     Status: Abnormal   Collection Time: 06/28/24  6:22 AM  Result Value Ref Range   Hgb A1c MFr Bld 5.7 (H) 4.8 - 5.6 %    Comment: (NOTE) Diagnosis of Diabetes The following HbA1c ranges recommended by the American Diabetes Association (ADA) may be used as an aid in the diagnosis of diabetes mellitus.  Hemoglobin             Suggested A1C NGSP%               Diagnosis  <5.7                   Non Diabetic  5.7-6.4                Pre-Diabetic  >6.4                   Diabetic  <7.0                   Glycemic control for                       adults with diabetes.     Mean Plasma Glucose 116.89 mg/dL    Comment: Performed at [facility], [address], [place], [place] [address]  Lipid panel     Status: Abnormal   Collection Time: 06/28/24  6:22 AM  Result Value Ref Range   Cholesterol 174 0 - 200 mg/dL    Comment:        ATP III CLASSIFICATION:  <200     mg/dL   Desirable  799-760  mg/dL   Borderline High  >=759    mg/dL   High           Triglycerides 150 (H) <150 mg/dL   HDL 31 (L) >59 mg/dL   Total CHOL/HDL Ratio 5.5 RATIO   VLDL 30 0 - 40 mg/dL   LDL Cholesterol 886 (H) 0 - 99 mg/dL    Comment:        Total Cholesterol/HDL:CHD Risk Coronary Heart Disease Risk Table                     Men   Women  1/2 Average Risk   3.4   3.3  Average Risk       5.0   4.4  2 X Average Risk   9.6   7.1  3 X Average Risk  23.4   11.0        Use the calculated Patient Ratio above and the CHD Risk Table to determine the patient's CHD Risk.        ATP III CLASSIFICATION (LDL):  <100     mg/dL   Optimal  899-870  mg/dL   Near or Above  Optimal  130-159  mg/dL   Borderline  839-810  mg/dL   High  >809     mg/dL   Very High Performed at [facility], [address] [place], [place] [address]   TSH     Status: None   Collection Time: 06/29/24  6:28 AM  Result Value Ref Range   TSH 1.610 0.350 - 4.500 uIU/mL    Comment: Performed at [facility], [address] [place], [place] [address]    Blood Alcohol level:  Lab Results  Component Value Date   ETH 38 (H) 06/26/2024    Metabolic Labs: Lab Results  Component Value Date   HGBA1C 5.7 (H) 06/28/2024   MPG 116.89 06/28/2024   MPG 131.24 01/25/2021   No results found for: PROLACTIN Lab Results  Component  Value Date   CHOL 174 06/28/2024   TRIG 150 (H) 06/28/2024   HDL 31 (L) 06/28/2024   CHOLHDL 5.5 06/28/2024   VLDL 30 06/28/2024   LDLCALC 113 (H) 06/28/2024   LDLCALC 125 (H) 01/25/2021    Physical Findings: AIMS: No  CIWA:    COWS:     Psychiatric Specialty Exam:  Presentation   General Appearance: Casual; Disheveled  Eye Contact: Good  Speech: Clear and Coherent  Speech Volume: Normal  Handedness: No data recorded  Mood and Affect   Mood: -- (better)  Affect: Flat (tired-appearing)   Thinking   Thought Processes: Linear  Descriptions of Associations: Intact  Orientation: Full (Time, Place and Person)  Thought Content: Logical  History of Schizophrenia/Schizoaffective disorder: No data recorded  Duration of Psychotic Symptoms: N/A  Hallucinations: None  Ideas of Reference: None  Suicidal Thoughts: No  Homicidal Thoughts: No   Sensorium    Memory: Immediate Fair  Judgment: Fair  Insight: Fair   Art therapist    Concentration: Fair  Attention Span: Fair  Recall: Fair  Fund of Knowledge: Fair  Language: Fair   Psychomotor Activity: Normal    Assets: No data recorded   Sleep: Good 10.75    Physical Exam ROS Blood pressure (!) 150/88, pulse 68, temperature 98 F (36.7 C), temperature source Oral, resp. rate 20, height 5' 5 (1.651 m), weight 132.9 kg, SpO2 97%. Body mass index is 48.76 kg/m.   Treatment Plan Summary: Daily contact with patient to assess and evaluate symptoms and progress in treatment and Medication management     ASSESSMENT:    Dalton Pena is a 43 year old male with past psychiatric history of MDD,  suicide attempt/gesture 20 years ago, ADHD.  Patient is unable to clearly explain events that led to this hospitalization, only vaguely referred to him being triggered after conversation with his sister, in which he felt that his sister did not return the love that he has for her.  He  endorsed chronic passive suicidality, but no active suicidal thinking or homicidal thinking at this time.  We were unable to reach either his sister, IVC petitioner, or his mother, with whom patient gave unlimited permission to speak.  Patient does endorse some insight into his anger issues and is amenable to starting medication as well as behavioral interventions for mood stability as well as ongoing depression.  That said, we will continue the IVC as patient was unable to give us  a full accounting of the events that transpired in the setting of reported threat to kill sister.  We will make repeated attempts to reach out to sister/mother for further  collateral in the coming days.  Will increase Lexapro  from 5 mg to 10 mg and start Abilify  2 mg for mood symptomatology.    Will continue IVC. Continuing current psychiatric medications, can consider increasing Abilify  in a few days. Patient improvement in behavioral control likely more to do with inner resources than one day of Abilify  2 mg. Will add amlodipine  5 mg for sBPs consistently in 150s. Could not reach collateral this AM, called IVC petitioner at both numbers listed and was unable to reach Sister. Will make another attempt tomorrow. Will attempt to reach fiancee in PM if able.    Diagnoses / Active Problems: Major Depressive Disorder, severe Concern for Cluster B traits Cannabis use Hx of cannabis use disorder   PLAN:   Safety and Monitoring: -  INVOLUNTARY  admission to inpatient psychiatric unit for safety, stabilization and treatment. - Daily contact with patient to assess and evaluate symptoms and progress in treatment - Patient's case to be discussed in multi-disciplinary team meeting -  Observation Level : q15 minute checks -  Vital signs:  q12 hours -  Precautions: suicide, elopement, and assault   2. Psychiatric Diagnoses and Treatment:     # Major Depressive Disorder, Severe # Concern for Cluster B traits - Continue Lexapro   10 mg for depressed mood. - Continue Abilify  2 mg for mood instability in the setting of Cluster B traits. - Continue involuntary commitment  - The risks/benefits/side-effects/alternatives to this medication were discussed in detail with the patient and time was given for questions. The patient consents to medication trial.  - Metabolic profile and EKG monitoring obtained while on an atypical antipsychotic  BMI: 48.75 TSH: Ordered, awaiting Lipid panel: LDL 125 HbgA1c: 6.2 QTc: EKG Ordered - Encouraged patient to participate in unit milieu and in scheduled group therapies  - Short Term Goals: Ability to disclose and discuss suicidal ideas, Ability to demonstrate self-control will improve, and Ability to identify and develop effective coping behaviors will improve - Long Term Goals: Improvement in symptoms so as ready for discharge   Other PRNS: Agitation   Other labs reviewed on admission:                3. Medical Issues Being Addressed:    # Tobacco Use Disorder  - Nicotine  patch 21mg /24 hours ordered  - Smoking cessation encouraged.   # Deep vein thrombosis, Hx of PE - Continue Home Xarelto     #Cystic Lung Disease  - Continue home Incruse Ellipta    # COPD - Albuterol  3 mL every 4 hours as needed for wheezing and shortness of breath  # HTN  - Amlodipine  5 mg qday   4. Discharge Planning:    - Estimated discharge date: 5-7 days - Social work and case management to assist with discharge planning and identification of hospital follow-up needs prior to discharge. - Discharge concerns: Need to establish a safety plan; medication compliance and effectiveness. - Discharge goals: Return home with outpatient referrals for mental health follow-up including medication management/psychotherapy.   I certify that inpatient services furnished can reasonably be expected to improve the patient's condition.     NB: This note was created using a voice recognition software as a result  there may be grammatical errors inadvertently enclosed that do not reflect the nature of this encounter. Every attempt is made to correct such errors.   Odis Cleveland, MD PGY-2, Psychiatry Residency  9/24/202510:18 AM

## 2024-06-29 NOTE — BH IP Treatment Plan (Signed)
 Interdisciplinary Treatment and Diagnostic Plan Update  06/29/2024 Time of Session: 1013AM Dalton Pena MRN: [id number]  Principal Diagnosis: MDD (major depressive disorder), recurrent severe, without psychosis (HCC)  Secondary Diagnoses: Principal Problem:   MDD (major depressive disorder), recurrent severe, without psychosis (HCC) Active Problems:   GAD (generalized anxiety disorder)   PTSD (post-traumatic stress disorder)   Current Medications:  Current Facility-Administered Medications  Medication Dose Route Frequency Provider Last Rate Last Admin   acetaminophen  (TYLENOL ) tablet 650 mg  650 mg Oral Q6H PRN Donnelly Mellow, MD       albuterol  (PROVENTIL ) (2.5 MG/3ML) 0.083% nebulizer solution 3 mL  3 mL Inhalation Q4H PRN Donnelly Mellow, MD       alum & mag hydroxide-simeth (MAALOX/MYLANTA) 200-200-20 MG/5ML suspension 30 mL  30 mL Oral Q4H PRN Donnelly Mellow, MD       amLODipine  (NORVASC ) tablet 5 mg  5 mg Oral Daily Rollene Katz, MD   5 mg at 06/29/24 1054   ARIPiprazole  (ABILIFY ) tablet 2 mg  2 mg Oral Daily Rollene Katz, MD   2 mg at 06/29/24 9247   diphenhydrAMINE  (BENADRYL ) capsule 25 mg  25 mg Oral Q6H PRN Jadapalle, Sree, MD       haloperidol  (HALDOL ) tablet 5 mg  5 mg Oral TID PRN Jadapalle, Sree, MD   5 mg at 06/29/24 1329   And   diphenhydrAMINE  (BENADRYL ) capsule 50 mg  50 mg Oral TID PRN Jadapalle, Sree, MD   50 mg at 06/29/24 1328   haloperidol  lactate (HALDOL ) injection 5 mg  5 mg Intramuscular TID PRN Jadapalle, Sree, MD       And   diphenhydrAMINE  (BENADRYL ) injection 50 mg  50 mg Intramuscular TID PRN Jadapalle, Sree, MD       And   LORazepam  (ATIVAN ) injection 2 mg  2 mg Intramuscular TID PRN Jadapalle, Sree, MD       haloperidol  lactate (HALDOL ) injection 10 mg  10 mg Intramuscular TID PRN Jadapalle, Sree, MD       And   diphenhydrAMINE  (BENADRYL ) injection 50 mg  50 mg Intramuscular TID PRN Jadapalle, Sree, MD       And   LORazepam   (ATIVAN ) injection 2 mg  2 mg Intramuscular TID PRN Jadapalle, Sree, MD       escitalopram  (LEXAPRO ) tablet 10 mg  10 mg Oral Daily Rollene Katz, MD   10 mg at 06/29/24 9247   hydrOXYzine  (ATARAX ) tablet 25 mg  25 mg Oral Q6H PRN Jadapalle, Sree, MD       magnesium  hydroxide (MILK OF MAGNESIA) suspension 30 mL  30 mL Oral Daily PRN Donnelly Mellow, MD       nicotine  (NICODERM CQ  - dosed in mg/24 hours) patch 14 mg  14 mg Transdermal Daily Zouev, Dmitri, MD   14 mg at 06/29/24 9247   rivaroxaban  (XARELTO ) tablet 20 mg  20 mg Oral Q supper Jadapalle, Sree, MD   20 mg at 06/28/24 1635   traZODone  (DESYREL ) tablet 50 mg  50 mg Oral QHS PRN Jadapalle, Sree, MD       umeclidinium bromide  (INCRUSE ELLIPTA ) 62.5 MCG/ACT 1 puff  1 puff Inhalation Daily Jadapalle, Sree, MD       PTA Medications: Medications Prior to Admission  Medication Sig Dispense Refill Last Dose/Taking   albuterol  (VENTOLIN  HFA) 108 (90 Base) MCG/ACT inhaler Inhale 2 puffs into the lungs.      rivaroxaban  (XARELTO ) 20 MG TABS tablet Take 1 tablet (20 mg total)  by mouth daily with supper. 30 tablet 1    tiZANidine  (ZANAFLEX ) 4 MG tablet TAKE 1 TABLET (4 MG TOTAL) BY MOUTH EVERY 8 (EIGHT) HOURS AS NEEDED FOR MUSCLE SPASMS 90 tablet 1    traMADol  (ULTRAM ) 50 MG tablet Take 50 mg by mouth every 8 (eight) hours as needed. (Patient not taking: Reported on 06/27/2024)      umeclidinium bromide  (INCRUSE ELLIPTA ) 62.5 MCG/ACT AEPB Inhale 1 puff into the lungs daily. (Patient not taking: Reported on 06/27/2024)       Patient Stressors: Financial difficulties   Health problems   Marital or family conflict    Patient Strengths: Other: Pt declined strengths. Angry and agitated.   Treatment Modalities: Medication Management, Group therapy, Case management,  1 to 1 session with clinician, Psychoeducation, Recreational therapy.   Physician Treatment Plan for Primary Diagnosis: MDD (major depressive disorder), recurrent severe, without  psychosis (HCC) Long Term Goal(s):     Short Term Goals: Ability to disclose and discuss suicidal ideas Ability to demonstrate self-control will improve Ability to identify and develop effective coping behaviors will improve  Medication Management: Evaluate patient's response, side effects, and tolerance of medication regimen.  Therapeutic Interventions: 1 to 1 sessions, Unit Group sessions and Medication administration.  Evaluation of Outcomes: Not Progressing  Physician Treatment Plan for Secondary Diagnosis: Principal Problem:   MDD (major depressive disorder), recurrent severe, without psychosis (HCC) Active Problems:   GAD (generalized anxiety disorder)   PTSD (post-traumatic stress disorder)  Long Term Goal(s):     Short Term Goals: Ability to disclose and discuss suicidal ideas Ability to demonstrate self-control will improve Ability to identify and develop effective coping behaviors will improve     Medication Management: Evaluate patient's response, side effects, and tolerance of medication regimen.  Therapeutic Interventions: 1 to 1 sessions, Unit Group sessions and Medication administration.  Evaluation of Outcomes: Not Progressing   RN Treatment Plan for Primary Diagnosis: MDD (major depressive disorder), recurrent severe, without psychosis (HCC) Long Term Goal(s): Knowledge of disease and therapeutic regimen to maintain health will improve  Short Term Goals: Ability to remain free from injury will improve, Ability to verbalize frustration and anger appropriately will improve, Ability to demonstrate self-control, Ability to participate in decision making will improve, Ability to verbalize feelings will improve, Ability to disclose and discuss suicidal ideas, Ability to identify and develop effective coping behaviors will improve, and Compliance with prescribed medications will improve  Medication Management: RN will administer medications as ordered by provider, will  assess and evaluate patient's response and provide education to patient for prescribed medication. RN will report any adverse and/or side effects to prescribing provider.  Therapeutic Interventions: 1 on 1 counseling sessions, Psychoeducation, Medication administration, Evaluate responses to treatment, Monitor vital signs and CBGs as ordered, Perform/monitor CIWA, COWS, AIMS and Fall Risk screenings as ordered, Perform wound care treatments as ordered.  Evaluation of Outcomes: Not Progressing   LCSW Treatment Plan for Primary Diagnosis: MDD (major depressive disorder), recurrent severe, without psychosis (HCC) Long Term Goal(s): Safe transition to appropriate next level of care at discharge, Engage patient in therapeutic group addressing interpersonal concerns.  Short Term Goals: Engage patient in aftercare planning with referrals and resources, Increase social support, Increase ability to appropriately verbalize feelings, Increase emotional regulation, Facilitate acceptance of mental health diagnosis and concerns, Facilitate patient progression through stages of change regarding substance use diagnoses and concerns, Identify triggers associated with mental health/substance abuse issues, and Increase skills for wellness and recovery  Therapeutic  Interventions: Assess for all discharge needs, 1 to 1 time with Child psychotherapist, Explore available resources and support systems, Assess for adequacy in community support network, Educate family and significant other(s) on suicide prevention, Complete Psychosocial Assessment, Interpersonal group therapy.  Evaluation of Outcomes: Not Progressing   Progress in Treatment: Attending groups: No. Participating in groups: No. Taking medication as prescribed: Yes. Toleration medication: Yes. Family/Significant other contact made: No, will contact:  Mother Sharyne Shutter [phone] or Rumalda Ada [place] Patient understands diagnosis:  Yes. Discussing patient identified problems/goals with staff: Yes. Medical problems stabilized or resolved: Yes. Denies suicidal/homicidal ideation: Yes. Issues/concerns per patient self-inventory: No.  New problem(s) identified: No, Describe:  none  New Short Term/Long Term Goal(s): medication stabilization, elimination of SI thoughts, development of comprehensive mental wellness plan.    Patient Goals:  Discharge  Discharge Plan or Barriers: Patient recently admitted. CSW will continue to follow and assess for appropriate referrals and possible discharge planning.    Reason for Continuation of Hospitalization: Anxiety Depression Homicidal ideation Medication stabilization  Estimated Length of Stay: 5-7 days  Last 3 [place] Suicide Severity Risk Score: Flowsheet Row Ad[facility] (Current) [facility] from 06/26/2024 in [facility] at [facility] ED from 06/18/2024 in [facility] Department at [facility]  C-SSRS RISK CATEGORY Low Risk Low Risk No Risk    Last PHQ 2/9 Scores:     No data to display          Scribe for Treatment Team: Jenkins LULLA Primer, ISRAEL 06/29/2024 3:19 PM

## 2024-06-29 NOTE — Group Note (Signed)
 Date:  06/29/2024 Time:  4:37 PM  Group Topic/Focus:  Dimensions of Wellness:   The focus of this group is to introduce the topic of wellness and discuss the role each dimension of wellness plays in total health.    Participation Level:  Did Not Attend    Shanda JONETTA Challenger 06/29/2024, 4:37 PM

## 2024-06-29 NOTE — Plan of Care (Signed)
   Problem: Education: Goal: Knowledge of [facility] Education information/materials will improve Outcome: Progressing Goal: Emotional status will improve Outcome: Progressing Goal: Mental status will improve Outcome: Progressing Goal: Verbalization of understanding the information provided will improve Outcome: Progressing   Problem: Activity: Goal: Interest or engagement in activities will improve Outcome: Progressing

## 2024-06-29 NOTE — [facility] Group Notes (Signed)
 Dalton Pena did not attend wrap up group

## 2024-06-30 MED ORDER — ARIPIPRAZOLE 2 MG PO TABS
2.0000 mg | ORAL_TABLET | Freq: Once | ORAL | Status: AC
Start: 2024-06-30 — End: 2024-06-30
  Administered 2024-06-30: 2 mg via ORAL
  Filled 2024-06-30: qty 1

## 2024-06-30 MED ORDER — ARIPIPRAZOLE 5 MG PO TABS
5.0000 mg | ORAL_TABLET | Freq: Every day | ORAL | Status: DC
  Administered 2024-07-01: 5 mg via ORAL
  Filled 2024-06-30: qty 1
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
## 2024-06-30 NOTE — [facility] Group Notes (Signed)
 Adult Psychoeducational Group Note  Date:  06/30/2024 Time:  9:13 PM  Group Topic/Focus:  Wrap-Up Group:   The focus of this group is to help patients review their daily goal of treatment and discuss progress on daily workbooks.  Participation Level:  Active  Participation Quality:  Appropriate  Affect:  Appropriate  Cognitive:  Appropriate  Insight: Appropriate  Engagement in Group:  Engaged  Modes of Intervention:  Discussion  Additional Comments:  Kalai said his day was a 6. His goal to find out a discharge date, he did not meet his goal. Coping skills talking with others Favorite part of the day talk to his daughter   [name] 06/30/2024, 9:13 PM

## 2024-06-30 NOTE — Plan of Care (Signed)
  Problem: Education: Goal: Knowledge of [facility] Education information/materials will improve Outcome: Progressing Goal: Verbalization of understanding the information provided will improve Outcome: Progressing   Problem: Activity: Goal: Sleeping patterns will improve Outcome: Progressing   

## 2024-06-30 NOTE — Group Note (Signed)
 LCSW Group Therapy Note   Group Date: 06/30/2024 Start Time: 1100 End Time: 1200   Participation:  did not attend  Type of Therapy:  Group Therapy  Topic:  Healing Flames: Navigating Anger with Compassion  Objective:  Foster self-awareness and promote compassion toward oneself and others when dealing with anger.  Goals:  Help participants understand the underlying emotions and needs fueling anger. Provide coping strategies for healthier emotional expression and anger management.  Summary: This session explored anger as a volcano - an explosion driven by deeper feelings and unmet needs. Participants learned to identify anger triggers and underlying emotions, then practiced coping strategies like deep breathing, physical activity, and journaling. The group discussed healthy ways to manage anger before it escalates, using both personal reflection and shared experiences.  Therapeutic Modalities: Cognitive Behavioral Therapy (CBT): Challenging thoughts that fuel anger. Mindfulness: Increasing awareness of emotions and sensations.   Dalton Pena, LCSWA 06/30/2024  12:50 PM

## 2024-06-30 NOTE — Progress Notes (Signed)
 D. Pt has been calm and cooperative, but guarded-visible in the milieu, minimal interaction with peers and staff. Pt reported sleeping well, described his appetite and concentration as 'good', and energy level as 'normal'. Per pt's self inventory, pt rated his depression,hopelessness and anxiety all 0's.  Pt currently denies SI/HI and AVH and doesn't appear to be responding to internal stimuli. A. Labs and vitals monitored. Pt given and educated on medications. Pt supported emotionally and encouraged to express concerns and ask questions.   R. Pt remains safe with 15 minute checks. Will continue POC.

## 2024-06-30 NOTE — Group Note (Signed)
 Recreation Therapy Group Note   Group Topic:Other  Group Date: 06/30/2024 Start Time: 1307 End Time: 1346 Facilitators: Katheline Brendlinger-McCall, LRT,CTRS Location: [address] Dayroom   Activity Description/Intervention: Therapeutic Drumming. Patients with peers and staff were given the opportunity to engage in a leader facilitated [employer] Group Empowerment Drumming Circle with staff from the [employer], in partnership with [employer]. Teaching laboratory technician and trained [employer], Norleen Mon leading with LRT observing and documenting intervention and pt response. This evidenced-based practice targets 7 areas of health and wellbeing in the human experience including: stress-reduction, exercise, self-expression, camaraderie/support, nurturing, spirituality, and music-making (leisure).   Goal Area(s) Addresses:  Patient will engage in pro-social way in music group.  Patient will follow directions of drum leader on the first prompt. Patient will demonstrate no behavioral issues during group.  Patient will identify if a reduction in stress level occurs as a result of participation in therapeutic drum circle.    Education: Leisure exposure, Pharmacologist, Musical expression, Discharge Planning   Affect/Mood: N/A   Participation Level: Did not attend    Clinical Observations/Individualized Feedback:      Plan: Continue to engage patient in RT group sessions 2-3x/week.   Fallou Hulbert-McCall, LRT,CTRS 06/30/2024 2:28 PM

## 2024-06-30 NOTE — Group Note (Signed)
 Date:  06/30/2024 Time:  9:33 AM  Group Topic/Focus:  Goals Group:   The focus of this group is to help patients establish daily goals to achieve during treatment and discuss how the patient can incorporate goal setting into their daily lives to aide in recovery.    Participation Level:  Did Not Attend  Participation Quality:  na  Affect:  na  Cognitive:  na  Insight: None  Engagement in Group:  na  Modes of Intervention:  na  Additional Comments:  na  Nat Rummer 06/30/2024, 9:33 AM
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
## 2024-06-30 NOTE — Progress Notes (Signed)
(  Sleep Hours) - 8.25 (Any PRNs that were needed, meds refused, or side effects to meds)- None (Any disturbances and when (visitation, over night)- None (Concerns raised by the patient)- None (SI/HI/AVH)- Denied

## 2024-06-30 NOTE — Progress Notes (Addendum)
   06/30/24 1000  Psych Admission Type (Psych Patients Only)  Admission Status Involuntary  Psychosocial Assessment  Patient Complaints Anxiety;Depression  Eye Contact Fair  Facial Expression Flat  Affect Appropriate to circumstance  Speech Logical/coherent  Interaction Minimal  Motor Activity Other (Comment) (steady gait)  Appearance/Hygiene Unremarkable  Behavior Characteristics Cooperative  Mood Depressed  Thought Process  Coherency WDL  Content WDL  Delusions None reported or observed  Perception WDL  Hallucination None reported or observed  Judgment Impaired  Confusion None  Danger to Self  Current suicidal ideation? Denies  Danger to Others  Danger to Others None reported or observed

## 2024-07-01 MED ORDER — RISPERIDONE 1 MG PO TABS
1.0000 mg | ORAL_TABLET | Freq: Two times a day (BID) | ORAL | Status: DC
Start: 2024-07-01 — End: 2024-07-05
  Administered 2024-07-01 – 2024-07-05 (×8): 1 mg via ORAL
  Filled 2024-07-01 (×8): qty 1
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
## 2024-07-01 NOTE — Group Note (Signed)
 Recreation Therapy Group Note   Group Topic:Leisure Education  Group Date: 07/01/2024 Start Time: 0935 End Time: 1003 Facilitators: Stuart Mirabile-McCall, LRT,CTRS Location: [address] Dayroom   Group Topic: Leisure Education  Goal Area(s) Addresses:  Patient will identify positive leisure activities.  Patient will identify one positive benefit of participation in leisure activities.   Behavioral Response: Minimal  Intervention: Leisure Group Game  Activity: Patient, MHT, and LRT participated in playing a trivia game of Guess the [place]. Participants took turns flicking the spinner. Whatever category (R&B, Hip Hop, Pop, Indie, Dance and Rock) the spinner landed on, the person would be read the lyrics and the person would have guess the missing lyric. The person with the most cards at the end of game won.  Education:  Leisure Exposure, Pharmacist, community, Discharge Planning  Education Outcome: Acknowledges education/In group clarification offered/Needs additional education   Affect/Mood: Appropriate   Participation Level: Minimal   Participation Quality: Independent   Behavior: Appropriate   Speech/Thought Process: Relevant   Insight: Good   Judgement: Good   Modes of Intervention: Competitive Play   Patient Response to Interventions:  Attentive   Education Outcome:  In group clarification offered    Clinical Observations/Individualized Feedback: Pt was quiet but had minimal engagement during the activity. Pt was attentive and appropriate during group.      Plan: Continue to engage patient in RT group sessions 2-3x/week.   Dalton Pena, LRT,CTRS 07/01/2024 12:37 PM

## 2024-07-01 NOTE — Progress Notes (Signed)
   07/01/24 0850  Psych Admission Type (Psych Patients Only)  Admission Status Involuntary  Psychosocial Assessment  Patient Complaints None  Eye Contact Fair  Facial Expression Flat  Affect Flat  Speech Logical/coherent  Interaction Minimal  Motor Activity Other (Comment) (WDL)  Appearance/Hygiene Unremarkable  Behavior Characteristics Cooperative  Mood Depressed  Thought Process  Coherency WDL  Content WDL  Delusions None reported or observed  Perception WDL  Hallucination None reported or observed  Judgment Poor  Confusion None  Danger to Self  Current suicidal ideation? Denies  Agreement Not to Harm Self Yes  Description of Agreement verbal  Danger to Others  Danger to Others None reported or observed

## 2024-07-01 NOTE — Group Note (Signed)
 Date:  07/01/2024 Time:  2000  Group Topic/Focus:  Group Reflection/Wrap-Up: What's Changed?  Objective: Highlight the power of asking questions to empower oneself in managing mental health. Prompt participants to reflect on their previous understanding of mental health and share how their perception have changed after asking questions throughout the day. Encouraged to use Ask Me 3 as a guide to ask important questions about their care.    Participation Level:  Active  Participation Quality:  Appropriate  Affect:  Appropriate  Cognitive:  Appropriate  Insight: Appropriate  Engagement in Group:  Engaged  Modes of Intervention:  Activity, Discussion, and Education  Additional Comments:  Patient actively participated during group therapy.   Keyondre Hepburn 07/01/2024, 10:38 PM

## 2024-07-01 NOTE — Group Note (Signed)
 Date:  07/01/2024 Time:  9:11 AM  Group Topic/Focus:  Goals Group:   The focus of this group is to help patients establish daily goals to achieve during treatment and discuss how the patient can incorporate goal setting into their daily lives to aide in recovery. Patients were asked two questions: What is something you can do today to take care of your mental health? What is 1 thing you're proud of doing lately?    Participation Level:  Did Not Attend  Participation Quality:  N/A  Affect:  N/A  Cognitive:  N/A  Insight: None  Engagement in Group:  N/A  Modes of Intervention:  N/A  Additional Comments:  Patient did not attend goals group.  Kristi HERO Londen Bok 07/01/2024, 9:11 AM

## 2024-07-01 NOTE — Plan of Care (Signed)
   Problem: Coping: Goal: Ability to verbalize frustrations and anger appropriately will improve Outcome: Progressing Goal: Ability to demonstrate self-control will improve Outcome: Progressing

## 2024-07-01 NOTE — Progress Notes (Signed)
   07/01/24 2000  Psych Admission Type (Psych Patients Only)  Admission Status Involuntary  Psychosocial Assessment  Patient Complaints None  Eye Contact Fair  Facial Expression Flat  Affect Appropriate to circumstance  Speech Logical/coherent  Interaction Minimal  Motor Activity Other (Comment) (wnl)  Appearance/Hygiene Unremarkable  Behavior Characteristics Cooperative  Mood Depressed  Thought Process  Coherency WDL  Content WDL  Delusions None reported or observed  Perception WDL  Hallucination None reported or observed  Judgment Poor  Confusion None  Danger to Self  Current suicidal ideation? Denies

## 2024-07-01 NOTE — Group Note (Deleted)
 Date:  07/01/2024 Time:  10:40 PM  Group Topic/Focus:  Emotional Education:   The focus of this group is to discuss what feelings/emotions are, and how they are experienced.     Participation Level:  {BHH PARTICIPATION OZCZO:77735}  Participation Quality:  {BHH PARTICIPATION QUALITY:22265}  Affect:  {BHH AFFECT:22266}  Cognitive:  {BHH COGNITIVE:22267}  Insight: {BHH Insight2:20797}  Engagement in Group:  {BHH ENGAGEMENT IN HMNLE:77731}  Modes of Intervention:  {BHH MODES OF INTERVENTION:22269}  Additional Comments:  ***  Dalton Pena L 07/01/2024, 10:40 PM

## 2024-07-02 NOTE — Progress Notes (Signed)
   07/02/24 1935  Psych Admission Type (Psych Patients Only)  Admission Status Involuntary  Psychosocial Assessment  Patient Complaints Other (Comment) (pt c/o vien in right upper leg soreness)  Eye Contact Fair  Facial Expression Flat  Affect Appropriate to circumstance  Speech Logical/coherent  Interaction Minimal  Motor Activity Other (Comment) (wnl)  Appearance/Hygiene Unremarkable  Behavior Characteristics Cooperative  Mood Depressed  Thought Process  Coherency WDL  Content WDL  Delusions None reported or observed  Perception WDL  Hallucination None reported or observed  Judgment Poor  Confusion None  Danger to Self  Current suicidal ideation? Denies  Danger to Others  Danger to Others None reported or observed

## 2024-07-02 NOTE — Group Note (Signed)
 Date:  07/02/2024 Time:  11:33 AM  Group Topic/Focus:  Exploring control, boundaries, and coping mechanisms Patients engaged in a creative exercise where they each drew on a blank piece of paper, then passed their papers around, adding to each other's drawings. This activity helped explore the experience of sharing control. Afterward, the group reflected on how it felt to let others contribute to their drawings, and how this mirrors real-life experiences of navigating change.  The conversation then focused on boundaries, with worksheets and scenarios sparking a discussion on healthy vs. unhealthy boundaries and coping strategies. Patients explored how these concepts connect to personal control and how they can positively impact relationships and daily life. The session ended with a focus on how setting healthy boundaries and using positive coping strategies can help restore a sense of control and emotional well-being.   Participation Level:  Did Not Attend  Participation Quality:  N/A  Affect:  N/A  Cognitive:  N/A  Insight: None  Engagement in Group:  None  Modes of Intervention:  N/A  Additional Comments:  Patient did not attend this group.  Kristi HERO Bobie Kistler 07/02/2024, 11:33 AM

## 2024-07-02 NOTE — Group Note (Signed)
 Date:  07/02/2024 Time:  6:09 PM  Group Topic/Focus:  Developing a Wellness Toolbox:   The focus of this group is to help patients develop a wellness toolbox with skills and strategies to promote recovery upon discharge. Identifying Needs:   The focus of this group is to help patients identify their personal needs that have been historically problematic and identify healthy behaviors to address their needs.    Participation Level:  Active  Participation Quality:  Appropriate  Affect:  Appropriate  Cognitive:  Appropriate  Insight: Appropriate  Engagement in Group:  Engaged  Modes of Intervention:  Discussion  Additional Comments:    Eleanor JAYSON Metro 07/02/2024, 6:09 PM

## 2024-07-02 NOTE — Progress Notes (Signed)
(  Sleep Hours) -13 (Any PRNs that were needed, meds refused, or side effects to meds)- none (Any disturbances and when (visitation, over night)-none (Concerns raised by the patient)- none (SI/HI/AVH)- denies all

## 2024-07-02 NOTE — Progress Notes (Signed)
 Writer explain to [employer] the watch has to be Publishing rights manager with a face numbered 1 thru 12. And a simple watch band with no additional add on. Writer showed Sharone a picture of the Theme park manager was wearing. Austine want to know how does his finance supposed to know what to bring. Writer explain he ( the patient ) supposed to tell whom ever bring items from home they different thing that allow on the unit. Ol[place] said how he supposed to know. Writer explain this was in [employer]. The nurse ask all patients upon admission to read the information in the navy blue folder. Markevion also questioned the staff (Jalyn) about items brought in by visitors on Tuesday June 28, 2024. Jalyn explain all items allow on the unit brought in by visitor are in the navy blue folder that was given by the nurse upon admission. RN notify on both days.
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
## 2024-07-02 NOTE — Plan of Care (Signed)
   Problem: Activity: Goal: Interest or engagement in activities will improve Outcome: Progressing   Problem: Coping: Goal: Ability to verbalize frustrations and anger appropriately will improve Outcome: Progressing Goal: Ability to demonstrate self-control will improve Outcome: Progressing

## 2024-07-02 NOTE — Group Note (Signed)
 LCSW Group Therapy Note   Group Date: 07/02/2024 Start Time: 1300 End Time: 1400   Type of Therapy and Topic:  Group Therapy: Boundaries  Participation Level:  Active  Description of Group: This group will address the use of boundaries in their personal lives. Patients will explore why boundaries are important, the difference between healthy and unhealthy boundaries, and negative and postive outcomes of different boundaries and will look at how boundaries can be crossed.  Patients will be encouraged to identify current boundaries in their own lives and identify what kind of boundary is being set. Facilitators will guide patients in utilizing problem-solving interventions to address and correct types boundaries being used and to address when no boundary is being used. Understanding and applying boundaries will be explored and addressed for obtaining and maintaining a balanced life. Patients will be encouraged to explore ways to assertively make their boundaries and needs known to significant others in their lives, using other group members and facilitator for role play, support, and feedback.  Therapeutic Goals:  1.  Patient will identify areas in their life where setting clear boundaries could be  used to improve their life.  2.  Patient will identify signs/triggers that a boundary is not being respected. 3.  Patient will identify two ways to set boundaries in order to achieve balance in  their lives: 4.  Patient will demonstrate ability to communicate their needs and set boundaries  through discussion and/or role plays  Summary of Patient Progress:  Pt was present/active throughout the session and proved open to feedback from CSW and peers. Patient demonstrated fare insight into the subject matter, was respectful of peers, and was present throughout the entire session.  Therapeutic Modalities:   Cognitive Behavioral Therapy Solution-Focused Therapy  Golda Louder, LCSWA 07/02/2024  3:08  PM

## 2024-07-02 NOTE — Group Note (Signed)
 Date:  07/02/2024 Time:  11:07 AM  Group Topic/Focus:  Goals Group:   The focus of this group is to help patients establish daily goals to achieve during treatment and discuss how the patient can incorporate goal setting into their daily lives to aide in recovery.    Participation Level:  Did Not Attend  Participation Quality:  N/A  Affect:  N/A  Cognitive:  N/A  Insight: None  Engagement in Group:  N/A  Modes of Intervention:  N/A  Additional Comments:  Patient did not attend goals group.  Kristi HERO Avaleen Brownley 07/02/2024, 11:07 AM

## 2024-07-03 NOTE — Group Note (Signed)
 Date:  07/03/2024 Time:  12:50 PM  Group Topic/Focus:  Goals Group:   The focus of this group is to help patients establish daily goals to achieve during treatment and discuss how the patient can incorporate goal setting into their daily lives to aide in recovery.    Participation Level:  Active  Participation Quality:  Attentive  Affect:  Appropriate  Cognitive:  Appropriate  Insight: Good  Engagement in Group:  Engaged  Modes of Intervention:  Education  Additional Comments:    Avelina DELENA Humphreys 07/03/2024, 12:50 PM
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
## 2024-07-03 NOTE — Group Note (Signed)
 Date:  07/03/2024 Time:  1:37 PM  Group Topic/Focus:  Emotional Education:   The focus of this group is to discuss what feelings/emotions are, and how they are experienced.    Participation Level:  Did Not Attend  Participation Quality:  Did Not Attend  Affect:  Did Not Attend  Cognitive:  Did Not Attend  Insight: None  Engagement in Group:  Did Not Attend  Modes of Intervention:  Did Not Attend  Additional Comments:  Did Not Attend  Dalton Pena 07/03/2024, 1:37 PM

## 2024-07-03 NOTE — Group Note (Signed)
 Date:  07/03/2024 Time:  9:36 PM  Group Topic/Focus:  Wrap-Up Group:   The focus of this group is to help patients review their daily goal of treatment and discuss progress on daily workbooks.    Participation Level:  Minimal  Participation Quality:  Attentive  Affect:  Appropriate  Cognitive:  Appropriate  Insight: Good  Engagement in Group:  Engaged  Modes of Intervention:  Discussion  Additional Comments:  Patient  stated his day was an 8 out of 10  Bari Moats 07/03/2024, 9:36 PM

## 2024-07-03 NOTE — Group Note (Signed)
 Date:  07/03/2024 Time:  7:33 PM    Group Topic/Focus:  Group used a non- competitive card game to build mindful listening skills, and encourage acceptance and understanding of oneself and peers. Patients share, fostering empathy and personal growth in a supportive environment.     Participation Level:  Active  Participation Quality:  Appropriate and Attentive  Affect:  Appropriate  Cognitive:  Alert and Appropriate  Insight: Appropriate and Good  Engagement in Group:  Engaged  Modes of Intervention:  Activity, Discussion, Exploration, Rapport Building, Socialization, and Support  Additional Comments:    Dalton Pena 07/03/2024, 7:33 PM

## 2024-07-03 NOTE — Plan of Care (Signed)
   Problem: Education: Goal: Mental status will improve Outcome: Progressing   Problem: Activity: Goal: Interest or engagement in activities will improve Outcome: Progressing

## 2024-07-03 NOTE — Plan of Care (Signed)
 Collateral call note:  Collateral information via IVC petitioner Dalton Pena, sister [phone]): Sister has not spoken with patient. Has heard him talking to his daughter. Sounded OK at that time, notes that could be the finesse of him. Does not have a firearm. Only when he gets worked up is a threat to other people: doesn't like the word 'no'.   On collateral conversation with Dalton Pena, mother [phone]): Seemed pretty good. Talking normally. Asked after what was going on at home. Can come home as long as he stay calm and doesn't drink alcohol.  Mother OK with patient coming home later in the week, likely tuesday. No firearms.

## 2024-07-03 NOTE — Plan of Care (Signed)
  Problem: Activity: Goal: Sleeping patterns will improve Outcome: Progressing   Problem: Coping: Goal: Ability to verbalize frustrations and anger appropriately will improve Outcome: Progressing   Problem: Activity: Goal: Sleeping patterns will improve Outcome: Progressing   Problem: Coping: Goal: Ability to verbalize frustrations and anger appropriately will improve Outcome: Progressing

## 2024-07-03 NOTE — Progress Notes (Signed)
(  Sleep Hours) -8.25 (Any PRNs that were needed, meds refused, or side effects to meds)- none (Any disturbances and when (visitation, over night)-none (Concerns raised by the patient)- none (SI/HI/AVH)-denies all

## 2024-07-03 NOTE — Progress Notes (Signed)
   07/03/24 2209  Psych Admission Type (Psych Patients Only)  Admission Status Involuntary  Psychosocial Assessment  Patient Complaints None  Eye Contact Fair  Facial Expression Flat  Affect Appropriate to circumstance  Speech Logical/coherent  Interaction Minimal  Motor Activity Other (Comment) (WDL)  Appearance/Hygiene Unremarkable  Behavior Characteristics Appropriate to situation  Mood Depressed  Thought Process  Coherency WDL  Content WDL  Delusions None reported or observed  Perception WDL  Hallucination None reported or observed  Judgment Impaired  Confusion None  Danger to Self  Current suicidal ideation? Denies  Agreement Not to Harm Self Yes  Description of Agreement verbal  Danger to Others  Danger to Others None reported or observed

## 2024-07-03 NOTE — Plan of Care (Signed)
   Problem: Education: Goal: Mental status will improve Outcome: Progressing Goal: Verbalization of understanding the information provided will improve Outcome: Progressing   Problem: Activity: Goal: Interest or engagement in activities will improve Outcome: Progressing

## 2024-07-04 ENCOUNTER — Encounter (HOSPITAL_COMMUNITY): Payer: Self-pay

## 2024-07-04 NOTE — Group Note (Signed)
 Date:  07/04/2024 Time:  3:51 PM  Group Topic/Focus: Grief Market researcher  Emotional Education:   The focus of this group is to discuss what feelings/emotions are, and how they are experienced. Managing Feelings:   The focus of this group is to identify what feelings patients have difficulty handling and develop a plan to handle them in a healthier way upon discharge. Grief is a journey through emotions that can change  from day to day, or even moment to moment. These  twists and turns often feel unsettling--a lot like being  on a roller coaster. Assignment: Label Roller coaster for your unique emotional needs.    Participation Level:  Minimal  Participation Quality:  Appropriate  Affect:  Flat  Cognitive:  Alert  Insight: Appropriate  Engagement in Group:  Engaged  Modes of Intervention:  Activity   Earma Nicolaou N Zaara Sprowl 07/04/2024, 3:51 PM

## 2024-07-04 NOTE — Plan of Care (Signed)
   Problem: Education: Goal: Emotional status will improve Outcome: Progressing Goal: Mental status will improve Outcome: Progressing Goal: Verbalization of understanding the information provided will improve Outcome: Progressing   Problem: Activity: Goal: Interest or engagement in activities will improve Outcome: Progressing

## 2024-07-04 NOTE — Progress Notes (Signed)
 [facility] MD Progress Note  07/04/2024 11:20 AM Dalton Pena  MRN:  [id number]  Principal Problem: MDD (major depressive disorder), recurrent severe, without psychosis (HCC) Diagnosis: Principal Problem:   MDD (major depressive disorder), recurrent severe, without psychosis (HCC) Active Problems:   GAD (generalized anxiety disorder)   PTSD (post-traumatic stress disorder)   Reason for Admission:  Dalton Pena is a 43 y.o. male  with a past psychiatric history of ADHD, MDD, cannabis use, cocaine use disorder. Patient initially arrived to [facility] on 9/21 for threatening to kill sister, and admitted to [facility] under IVC on 9/23 for acute safety concerns, acute suicidal or self-harming behaviors, homicidal behaviors, and severe substance-induced psychosis or mood disturbances. Medically cleared at [facility], leukocytosis of 13 (appears chronic). 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 Acute respiratory failure 2/2 COPD in 2022. Of note, patient was seen in [facility] ED at [facility] for low back pain, complex medical history -- some behavioral dysregulation during tht visit, said that staff were going to kill him. I know people come in and die from something they didn't even come in for.   (admitted on 06/27/2024, total  LOS: 7 days )  Overnight events: HR 59. BP 157/106 --> 139/77. Hydroxyzine  25 mg x1. Slept 6.25. Pt upset that roommate urinated on floor in room. Slept in quiet room for night, no issues. Denied SI, HI, AVH.   On interview: Patient continues to deny issues today. Patient had an incident last night that he handled appropriately by discussing with staff instead of acting out. Patient has discussed with mother discharge tomorrow, agrees with this, around 10 am. Discussed with patient need  for additional time to monitor for side effect profile of risperdal  in someone who is antipsychotic naive. Also discussed need for PCP follow-up for patient's hypertension/likely OSA. Patient is interested in getting set up with a therapist, informed he would have appropriate information on his AVS. Denied SI, HI, AVH. Eating and sleeping well.   Past Psychiatric History: Current psychiatrist: None Current therapist: None Previous psychiatric diagnoses: ADHD, MDD, suicide attempt/gesture Current psychiatric medications: None Psychiatric medication history/compliance: None Psychiatric hospitalization(s): Over 20 years ago after overdose on pills as teen.  Psychotherapy history: None Neuromodulation history: None History of suicide (obtained from HPI): suicide attempt vs gesture wrt overdose on pills.  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  Follows with cardiology, but missed most recent appointment, Medical diagnoses: DVT/PE, thoracic spinal stenosis,?  Sarcoidosis, concern for heart failure exacerbation, COPD continues to smoke cigarettes Medications: Patient says he is not taking medications at home Allergies: Denies Hospitalizations: Per chart review, hospitalized in 2021 for acute hypoxic respiratory failure in the setting of COPD Surgeries: Multiple, previous Trauma: Denies Seizures: Denies   Social History: Living situation: Mom, daughter and sister Education: Eighth grade Occupational history: Unemployed Marital status: Single Children: Three children Legal: Domestic violence charge,  court date in november Military: None   Access to firearms: Denies   Family Psychiatric History: Denies   Family Medical History:  None pertinent   Total Time spent with patient: 2.5 hours   Is the patient at risk to self? Yes.    Has the patient been a risk to self in the past 6 months? No.  Has the patient been a risk to self within the distant past? Yes.     Is the patient a risk to others? No.  Has the patient been a risk to others in the past 6 months? No.  Has the patient been a risk to others within the distant past? No.   Current Medications: Current Facility-Administered Medications  Medication Dose Route Frequency Provider Last Rate Last Admin   acetaminophen  (TYLENOL ) tablet 650 mg  650 mg Oral Q6H PRN Jadapalle, Sree, MD   650 mg at 06/30/24 9147   albuterol  (PROVENTIL ) (2.5 MG/3ML) 0.083% nebulizer solution 3 mL  3 mL Inhalation Q4H PRN Jadapalle, Sree, MD       alum & mag hydroxide-simeth (MAALOX/MYLANTA) 200-200-20 MG/5ML suspension 30 mL  30 mL Oral Q4H PRN Jadapalle, Sree, MD       amLODipine  (NORVASC ) tablet 5 mg  5 mg Oral Daily Rollene Katz, MD   5 mg at 07/04/24 9360   diphenhydrAMINE  (BENADRYL ) capsule 25 mg  25 mg Oral Q6H PRN Jadapalle, Sree, MD       haloperidol  (HALDOL ) tablet 5 mg  5 mg Oral TID PRN Jadapalle, Sree, MD   5 mg at 06/29/24 1329   And   diphenhydrAMINE  (BENADRYL ) capsule 50 mg  50 mg Oral TID PRN Jadapalle, Sree, MD   50 mg at 06/29/24 1328   haloperidol  lactate (HALDOL ) injection 5 mg  5 mg Intramuscular TID PRN Jadapalle, Sree, MD       And   diphenhydrAMINE  (BENADRYL ) injection 50 mg  50 mg Intramuscular TID PRN Jadapalle, Sree, MD       And   LORazepam  (ATIVAN ) injection 2 mg  2 mg Intramuscular TID PRN Jadapalle, Sree, MD       haloperidol  lactate (HALDOL ) injection 10 mg  10 mg Intramuscular TID PRN Donnelly Mellow, MD       And   diphenhydrAMINE  (BENADRYL ) injection 50 mg  50 mg Intramuscular TID PRN Jadapalle, Sree, MD        And   LORazepam  (ATIVAN ) injection 2 mg  2 mg Intramuscular TID PRN Jadapalle, Sree, MD       escitalopram  (LEXAPRO ) tablet 10 mg  10 mg Oral Daily Rollene Katz, MD   10 mg at 07/04/24 9263   hydrOXYzine  (ATARAX ) tablet 25 mg  25 mg Oral Q6H PRN Jadapalle, Sree, MD   25 mg at 07/04/24 9262   magnesium  hydroxide (MILK OF MAGNESIA) suspension 30 mL  30 mL Oral Daily PRN Donnelly Mellow, MD       nicotine  (NICODERM CQ  - dosed in mg/24 hours) patch 14 mg  14 mg Transdermal Daily Zouev, Dmitri, MD   14 mg at 06/30/24 0846   risperiDONE  (RISPERDAL ) tablet 1 mg  1 mg Oral BID Rollene Katz, MD   1 mg at 07/04/24 9262   rivaroxaban  (XARELTO ) tablet 20 mg  20 mg Oral Q supper Jadapalle, Sree, MD   20 mg at 07/03/24 1703   traZODone  (DESYREL ) tablet 50 mg  50 mg Oral QHS PRN Jadapalle, Sree, MD       umeclidinium bromide  (INCRUSE ELLIPTA ) 62.5 MCG/ACT 1 puff  1 puff Inhalation Daily Donnelly Mellow, MD        Lab Results:  No results found  for this or any previous visit (from the past 48 hours).   Blood Alcohol level:  Lab Results  Component Value Date   ETH 38 (H) 06/26/2024    Metabolic Labs: Lab Results  Component Value Date   HGBA1C 5.7 (H) 06/28/2024   MPG 116.89 06/28/2024   MPG 131.24 01/25/2021   No results found for: PROLACTIN Lab Results  Component Value Date   CHOL 174 06/28/2024   TRIG 150 (H) 06/28/2024   HDL 31 (L) 06/28/2024   CHOLHDL 5.5 06/28/2024   VLDL 30 06/28/2024   LDLCALC 113 (H) 06/28/2024   LDLCALC 125 (H) 01/25/2021    Physical Findings: AIMS: No  CIWA:    COWS:     Psychiatric Specialty Exam:  Presentation   General Appearance: Appropriate for Environment  Eye Contact: Good  Speech: Clear and Coherent  Speech Volume: Normal  Handedness: No data recorded  Mood and Affect   Mood: Euthymic  Affect: Congruent; Restricted   Thinking   Thought Processes: Linear  Descriptions of Associations: Intact  Orientation:  Full (Time, Place and Person)  Thought Content: Logical  History of Schizophrenia/Schizoaffective disorder: No data recorded  Duration of Psychotic Symptoms: N/A  Hallucinations: None  Ideas of Reference: None  Suicidal Thoughts: No  Homicidal Thoughts: No   Sensorium    Memory: Immediate Fair  Judgment: Fair  Insight: Fair (improved)   Executive Functions    Concentration: Fair  Attention Span: Fair  Recall: [employer] of Knowledge: Fair  Language: Fair   Psychomotor Activity: Normal    Assets: Manufacturing systems engineer; Leisure Time    Sleep: Good 6.25    Physical Exam Constitutional:      General: He is not in acute distress.    Appearance: He is obese. He is not ill-appearing.  Pulmonary:     Effort: Pulmonary effort is normal.  Neurological:     Mental Status: He is alert.    Review of Systems  All other systems reviewed and are negative.  Blood pressure 139/77, pulse (!) 59, temperature 98.4 F (36.9 C), temperature source Oral, resp. rate 20, height 5' 5 (1.651 m), weight 132.9 kg, SpO2 99%. Body mass index is 48.76 kg/m.   Treatment Plan Summary: Daily contact with patient to assess and evaluate symptoms and progress in treatment and Medication management     ASSESSMENT:    Dalton Pena is a 43 year old male with past psychiatric history of MDD,  suicide attempt/gesture 20 years ago, ADHD.  Patient is unable to clearly explain events that led to this hospitalization, only vaguely referred to him being triggered after conversation with his sister, in which he felt that his sister did not return the love that he has for her.  He endorsed chronic passive suicidality, but no active suicidal thinking or homicidal thinking at this time.  We were unable to reach either his sister, IVC petitioner, or his mother, with whom patient gave unlimited permission to speak.  Patient does endorse some insight into his anger issues and is amenable  to starting medication as well as behavioral interventions for mood stability as well as ongoing depression.  That said, we will continue the IVC as patient was unable to give us  a full accounting of the events that transpired in the setting of reported threat to kill sister.  We will make repeated attempts to reach out to sister/mother for further collateral in the coming days.  Will increase Lexapro  from 5 mg to  10 mg and start Abilify  2 mg for mood symptomatology.   This will be patient's third day on risperdal  1 mg BID. Will need 24 hours further monitoring for adverse symptomatology in this patient without previous exposure to antipsychotic medication. Reports good response to medications, that he at first did not think that it helped but was surprised by his inner resources. That said, mother is amenable to discharge tomorrow. Patient asking after therapy resources, showing much improved insight. No new changes today.   Diagnoses / Active Problems: Major Depressive Disorder, severe Concern for Cluster B traits, ASPD Cannabis use Hx of cannabis use disorder   PLAN:   Safety and Monitoring: -  INVOLUNTARY  admission to inpatient psychiatric unit for safety, stabilization and treatment. - Daily contact with patient to assess and evaluate symptoms and progress in treatment - Patient's case to be discussed in multi-disciplinary team meeting -  Observation Level : q15 minute checks -  Vital signs:  q12 hours -  Precautions: suicide, elopement, and assault   2. Psychiatric Diagnoses and Treatment:     # Major Depressive Disorder, Severe # Concern for Cluster B traits - Continue Lexapro  10 mg for depressed mood. - Continue Risperdal  1 mg BID for mood instability in the setting of Cluster B traits. - Continue involuntary commitment  - The risks/benefits/side-effects/alternatives to this medication were discussed in detail with the patient and time was given for questions. The patient consents  to medication trial.  - Metabolic profile and EKG monitoring obtained while on an atypical antipsychotic  BMI: 48.75 TSH: Ordered, awaiting Lipid panel: LDL 125 HbgA1c: 6.2 QTc: EKG Ordered - Encouraged patient to participate in unit milieu and in scheduled group therapies  - Short Term Goals: Ability to disclose and discuss suicidal ideas, Ability to demonstrate self-control will improve, and Ability to identify and develop effective coping behaviors will improve - Long Term Goals: Improvement in symptoms so as ready for discharge   Other PRNS: Agitation   Other labs reviewed on admission:                3. Medical Issues Being Addressed:    # Tobacco Use Disorder  - Nicotine  patch 21mg /24 hours ordered  - Smoking cessation encouraged.   # Deep vein thrombosis, Hx of PE - Continue Home Xarelto     #Cystic Lung Disease  - Continue home Incruse Ellipta    # COPD - Albuterol  3 mL every 4 hours as needed for wheezing and shortness of breath  # HTN  - Amlodipine  5 mg qday   4. Discharge Planning:    - Estimated discharge date: 2-3 days - Social work and case management to assist with discharge planning and identification of hospital follow-up needs prior to discharge. - Discharge concerns: Need to establish a safety plan; medication compliance and effectiveness. - Discharge goals: Return home with outpatient referrals for mental health follow-up including medication management/psychotherapy.   I certify that inpatient services furnished can reasonably be expected to improve the patient's condition.    Signed: Odis Cleveland, MD [facility] Physician, PGY-2 07/04/2024 11:20 AM

## 2024-07-04 NOTE — Progress Notes (Signed)
   07/04/24 2107  Psych Admission Type (Psych Patients Only)  Admission Status Involuntary  Psychosocial Assessment  Patient Complaints None  Eye Contact Fair  Facial Expression Flat  Affect Appropriate to circumstance  Speech Logical/coherent  Interaction Minimal  Motor Activity Other (Comment) (WDL)  Appearance/Hygiene Unremarkable  Behavior Characteristics Cooperative;Calm  Mood Depressed;Anxious  Thought Process  Coherency WDL  Content WDL  Delusions None reported or observed  Perception WDL  Hallucination None reported or observed  Judgment Impaired  Confusion None  Danger to Self  Current suicidal ideation? Denies  Agreement Not to Harm Self Yes  Description of Agreement Verbal  Danger to Others  Danger to Others None reported or observed

## 2024-07-04 NOTE — Plan of Care (Signed)
 Nurse discussed coping skills with patient.

## 2024-07-04 NOTE — BH IP Treatment Plan (Signed)
 Interdisciplinary Treatment and Diagnostic Plan Update  07/04/2024 Time of Session: 12:00 PM - UPDATE Dalton Pena MRN: [id number]  Principal Diagnosis: MDD (major depressive disorder), recurrent severe, without psychosis (HCC)  Secondary Diagnoses: Principal Problem:   MDD (major depressive disorder), recurrent severe, without psychosis (HCC) Active Problems:   GAD (generalized anxiety disorder)   PTSD (post-traumatic stress disorder)   Current Medications:  Current Facility-Administered Medications  Medication Dose Route Frequency Provider Last Rate Last Admin   acetaminophen  (TYLENOL ) tablet 650 mg  650 mg Oral Q6H PRN Jadapalle, Sree, MD   650 mg at 06/30/24 9147   albuterol  (PROVENTIL ) (2.5 MG/3ML) 0.083% nebulizer solution 3 mL  3 mL Inhalation Q4H PRN Jadapalle, Sree, MD       alum & mag hydroxide-simeth (MAALOX/MYLANTA) 200-200-20 MG/5ML suspension 30 mL  30 mL Oral Q4H PRN Donnelly Mellow, MD       amLODipine  (NORVASC ) tablet 5 mg  5 mg Oral Daily Rollene Katz, MD   5 mg at 07/04/24 9360   diphenhydrAMINE  (BENADRYL ) capsule 25 mg  25 mg Oral Q6H PRN Jadapalle, Sree, MD       haloperidol  (HALDOL ) tablet 5 mg  5 mg Oral TID PRN Jadapalle, Sree, MD   5 mg at 06/29/24 1329   And   diphenhydrAMINE  (BENADRYL ) capsule 50 mg  50 mg Oral TID PRN Jadapalle, Sree, MD   50 mg at 06/29/24 1328   haloperidol  lactate (HALDOL ) injection 5 mg  5 mg Intramuscular TID PRN Jadapalle, Sree, MD       And   diphenhydrAMINE  (BENADRYL ) injection 50 mg  50 mg Intramuscular TID PRN Jadapalle, Sree, MD       And   LORazepam  (ATIVAN ) injection 2 mg  2 mg Intramuscular TID PRN Jadapalle, Sree, MD       haloperidol  lactate (HALDOL ) injection 10 mg  10 mg Intramuscular TID PRN Donnelly Mellow, MD       And   diphenhydrAMINE  (BENADRYL ) injection 50 mg  50 mg Intramuscular TID PRN Jadapalle, Sree, MD       And   LORazepam  (ATIVAN ) injection 2 mg  2 mg Intramuscular TID PRN Jadapalle, Sree, MD        escitalopram  (LEXAPRO ) tablet 10 mg  10 mg Oral Daily Rollene Katz, MD   10 mg at 07/04/24 9263   hydrOXYzine  (ATARAX ) tablet 25 mg  25 mg Oral Q6H PRN Jadapalle, Sree, MD   25 mg at 07/04/24 9262   magnesium  hydroxide (MILK OF MAGNESIA) suspension 30 mL  30 mL Oral Daily PRN Donnelly Mellow, MD       nicotine  (NICODERM CQ  - dosed in mg/24 hours) patch 14 mg  14 mg Transdermal Daily Zouev, Dmitri, MD   14 mg at 06/30/24 0846   risperiDONE  (RISPERDAL ) tablet 1 mg  1 mg Oral BID Rollene Katz, MD   1 mg at 07/04/24 9262   rivaroxaban  (XARELTO ) tablet 20 mg  20 mg Oral Q supper Jadapalle, Sree, MD   20 mg at 07/03/24 1703   traZODone  (DESYREL ) tablet 50 mg  50 mg Oral QHS PRN Jadapalle, Sree, MD       umeclidinium bromide  (INCRUSE ELLIPTA ) 62.5 MCG/ACT 1 puff  1 puff Inhalation Daily Jadapalle, Sree, MD       PTA Medications: Medications Prior to Admission  Medication Sig Dispense Refill Last Dose/Taking   albuterol  (VENTOLIN  HFA) 108 (90 Base) MCG/ACT inhaler Inhale 2 puffs into the lungs.      rivaroxaban  (XARELTO ) 20  MG TABS tablet Take 1 tablet (20 mg total) by mouth daily with supper. 30 tablet 1    tiZANidine  (ZANAFLEX ) 4 MG tablet TAKE 1 TABLET (4 MG TOTAL) BY MOUTH EVERY 8 (EIGHT) HOURS AS NEEDED FOR MUSCLE SPASMS 90 tablet 1    traMADol  (ULTRAM ) 50 MG tablet Take 50 mg by mouth every 8 (eight) hours as needed. (Patient not taking: Reported on 06/27/2024)      umeclidinium bromide  (INCRUSE ELLIPTA ) 62.5 MCG/ACT AEPB Inhale 1 puff into the lungs daily. (Patient not taking: Reported on 06/27/2024)       Patient Stressors: Financial difficulties   Health problems   Marital or family conflict    Patient Strengths: Other: Pt declined strengths. Angry and agitated.   Treatment Modalities: Medication Management, Group therapy, Case management,  1 to 1 session with clinician, Psychoeducation, Recreational therapy.   Physician Treatment Plan for Primary Diagnosis: MDD (major  depressive disorder), recurrent severe, without psychosis (HCC) Long Term Goal(s):     Short Term Goals: Ability to disclose and discuss suicidal ideas Ability to demonstrate self-control will improve Ability to identify and develop effective coping behaviors will improve  Medication Management: Evaluate patient's response, side effects, and tolerance of medication regimen.  Therapeutic Interventions: 1 to 1 sessions, Unit Group sessions and Medication administration.  Evaluation of Outcomes: Progressing  Physician Treatment Plan for Secondary Diagnosis: Principal Problem:   MDD (major depressive disorder), recurrent severe, without psychosis (HCC) Active Problems:   GAD (generalized anxiety disorder)   PTSD (post-traumatic stress disorder)  Long Term Goal(s):     Short Term Goals: Ability to disclose and discuss suicidal ideas Ability to demonstrate self-control will improve Ability to identify and develop effective coping behaviors will improve     Medication Management: Evaluate patient's response, side effects, and tolerance of medication regimen.  Therapeutic Interventions: 1 to 1 sessions, Unit Group sessions and Medication administration.  Evaluation of Outcomes: Progressing   RN Treatment Plan for Primary Diagnosis: MDD (major depressive disorder), recurrent severe, without psychosis (HCC) Long Term Goal(s): Knowledge of disease and therapeutic regimen to maintain health will improve  Short Term Goals: Ability to remain free from injury will improve, Ability to verbalize frustration and anger appropriately will improve, Ability to verbalize feelings will improve, and Ability to disclose and discuss suicidal ideas  Medication Management: RN will administer medications as ordered by provider, will assess and evaluate patient's response and provide education to patient for prescribed medication. RN will report any adverse and/or side effects to prescribing  provider.  Therapeutic Interventions: 1 on 1 counseling sessions, Psychoeducation, Medication administration, Evaluate responses to treatment, Monitor vital signs and CBGs as ordered, Perform/monitor CIWA, COWS, AIMS and Fall Risk screenings as ordered, Perform wound care treatments as ordered.  Evaluation of Outcomes: Progressing   LCSW Treatment Plan for Primary Diagnosis: MDD (major depressive disorder), recurrent severe, without psychosis (HCC) Long Term Goal(s): Safe transition to appropriate next level of care at discharge, Engage patient in therapeutic group addressing interpersonal concerns.  Short Term Goals: Engage patient in aftercare planning with referrals and resources, Increase ability to appropriately verbalize feelings, Facilitate acceptance of mental health diagnosis and concerns, and Identify triggers associated with mental health/substance abuse issues  Therapeutic Interventions: Assess for all discharge needs, 1 to 1 time with Social worker, Explore available resources and support systems, Assess for adequacy in community support network, Educate family and significant other(s) on suicide prevention, Complete Psychosocial Assessment, Interpersonal group therapy.  Evaluation of Outcomes: Progressing  Progress in Treatment: Attending groups: attended some groups Participating in groups: Yes Taking medication as prescribed: Yes. Toleration medication: Yes. Family/Significant other contact made: Yes, contacted Mother Sharyne Shutter [phone] or [facility]) [phone] Patient understands diagnosis: Yes. Discussing patient identified problems/goals with staff: Yes. Medical problems stabilized or resolved: Yes. Denies suicidal/homicidal ideation: Yes. Issues/concerns per patient self-inventory: No.   New problem(s) identified: No, Describe:  none   New Short Term/Long Term Goal(s): medication stabilization, elimination of SI thoughts, development of  comprehensive mental wellness plan.      Patient Goals:  Discharge   Discharge Plan or Barriers: Patient recently admitted. CSW will continue to follow and assess for appropriate referrals and possible discharge planning.      Reason for Continuation of Hospitalization: Anxiety Depression Homicidal ideation Medication stabilization   Estimated Length of Stay: 2 - 4 days  Last 3 [place] Suicide Severity Risk Score: Flowsheet Row Ad[facility] (Current) [facility] from [facility]26/2024 [facility] ED from 06/18/2024 in [facility] Department at [facility]  C-SSRS RISK CATEGORY Low Risk Low Risk No Risk    Last PHQ 2/9 Scores:     No data to display          Scribe for Treatment Team: Lawan Nanez O Feven Alderfer, LCSWA 07/04/2024 12:44 PM

## 2024-07-04 NOTE — Group Note (Signed)
 Date:  07/04/2024 Time:  9:21 PM  Group Topic/Focus:  Wrap-Up Group:   The focus of this group is to help patients review their daily goal of treatment and discuss progress on daily workbooks. Alcoholics Anonymous (AA) Meeting   Participation Level:  Active  Participation Quality:  Appropriate  Affect:  Appropriate  Cognitive:  Appropriate  Insight: Appropriate  Engagement in Group:  Engaged  Modes of Intervention:  Discussion, Socialization, and Support  Additional Comments:  Patient attended.  Dalton Pena 07/04/2024, 9:21 PM

## 2024-07-04 NOTE — Group Note (Signed)
 Recreation Therapy Group Note   Group Topic:Team Building  Group Date: 07/04/2024 Start Time: 0955 End Time: 1025 Facilitators: Zeriah Baysinger-McCall, LRT,CTRS Location: [address] Dayroom   Group Topic: Communication, Team Building, Problem Solving  Goal Area(s) Addresses:  Patient will effectively work with peer towards shared goal.  Patient will identify skills used to make activity successful.  Patient will identify how skills used during activity can be applied to reach post d/c goals.   Behavioral Response:   Intervention: STEM Activity- Glass blower/designer  Activity: Tallest [employer]. In teams of 5-6, patients were given 11 craft pipe cleaners. Using the materials provided, patients were instructed to compete again the opposing team(s) to build the tallest free-standing structure from floor level. The activity was timed; difficulty increased by Clinical research associate as Production designer, theatre/television/film continued.  Systematically resources were removed with additional directions for example, placing one arm behind their back, working in silence, and shape stipulations. LRT facilitated post-activity discussion reviewing team processes and necessary communication skills involved in completion. Patients were encouraged to reflect how the skills utilized, or not utilized, in this activity can be incorporated to positively impact support systems post discharge.  Education: Pharmacist, community, Scientist, physiological, Discharge Planning   Education Outcome: Acknowledges education/In group clarification offered/Needs additional education.    Affect/Mood: N/A   Participation Level: Did not attend    Clinical Observations/Individualized Feedback:      Plan: Continue to engage patient in RT group sessions 2-3x/week.   Chan Rosasco-McCall, LRT,CTRS 07/04/2024 11:59 AM
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
## 2024-07-04 NOTE — Group Note (Signed)
 Date:  07/04/2024 Time:  11:29 AM  Group Topic/Focus:  Emotional Education:   The focus of this group is to discuss what feelings/emotions are, and how they are experienced.    Participation Level:  Active  Participation Quality:  Attentive  Affect:  Appropriate  Cognitive:  Alert  Insight: Good  Engagement in Group:  Engaged  Modes of Intervention:  Watch a movie  Additional Comments:    Dalton Pena 07/04/2024, 11:29 AM

## 2024-07-04 NOTE — Progress Notes (Signed)
 D:  Patient's self inventory sheet, patient has fair sleep, no sleep medication.  Good appetite, normal energy level, good concentration.  Denied anxiety, depression and hopelessness.  Denied withdrawals.  Denied SI.  Denied physical problems.  Denied physical pain.  Goal is discharge home.  Does have discharge plans. A:  Medications administered per MD orders.  Emotional support and encouragement given patient. R:  Denied SI and HI, contracts for safety.  Denied A/V hallucinations.  Safety maintained with 15 minute checks.

## 2024-07-05 DIAGNOSIS — F411 Generalized anxiety disorder: Secondary | ICD-10-CM

## 2024-07-05 DIAGNOSIS — F4310 Post-traumatic stress disorder, unspecified: Secondary | ICD-10-CM

## 2024-07-05 MED ORDER — AMLODIPINE BESYLATE 5 MG PO TABS: 5.0000 mg | ORAL_TABLET | Freq: Every day | ORAL | 0 refills | Status: DC

## 2024-07-05 MED ORDER — HYDROXYZINE HCL 25 MG PO TABS: 25.0000 mg | ORAL_TABLET | Freq: Four times a day (QID) | ORAL | 0 refills | Status: DC | PRN

## 2024-07-05 MED ORDER — RISPERIDONE 1 MG PO TABS: 1.0000 mg | ORAL_TABLET | Freq: Two times a day (BID) | ORAL | 0 refills | Status: DC

## 2024-07-05 MED ORDER — ESCITALOPRAM OXALATE 10 MG PO TABS: 10.0000 mg | ORAL_TABLET | Freq: Every day | ORAL | 0 refills | Status: DC

## 2024-07-05 NOTE — Discharge Instructions (Addendum)
-   IF ANY MEDICATION IS TOO EXPENSIVE OR NOT COVERED BY YOUR INSURANCE, PLEASE CHECK [facility] Rx TO SEE IF THERE IS A LOWER-COST ALTERNATIVE   -Follow-up with your outpatient psychiatric provider -instructions on appointment date, time, and address (location) are provided to you in discharge paperwork.   -Take your psychiatric medications as prescribed at discharge - instructions are provided to you in the discharge paperwork   -Follow-up with outpatient primary care doctor and other specialists -for management of preventative medicine and chronic medical disease, including: suspected OSA (recommend sleep study), hypertension, COPD, deep vein thrombosis, pre-diabetes.    -Testing: Follow-up with outpatient provider for abnormal lab results: high blood pressure, LDL 113, A1c 5.7% H.    -Recommend abstinence from alcohol, tobacco, and other illicit drug use at discharge.    -If your psychiatric symptoms recur, worsen, or if you have side effects to your psychiatric medications, call your outpatient psychiatric provider, 911, 988 or go to the nearest emergency department.   -If suicidal thoughts recur, call your outpatient psychiatric provider, 911, 988 or go to the nearest emergency department.

## 2024-07-05 NOTE — Progress Notes (Signed)
(  Sleep Hours) - 7.5 (Any PRNs that were needed, meds refused, or side effects to meds)- PRN vistaril  25 mg and trazodone  50 mg given at pt request, no meds refused.  (Any disturbances and when (visitation, over night)- None  (Concerns raised by the patient)- None  (SI/HI/AVH)- Denies SI/HI/AVH

## 2024-07-05 NOTE — Progress Notes (Signed)
 Pt discharged to lobby. Pt was stable and appreciative at that time. All papers and prescriptions were given and valuables returned. Verbal understanding expressed. Denies SI/HI and A/VH. Pt given opportunity to express concerns and ask questions.

## 2024-07-05 NOTE — Group Note (Signed)
 Date:  07/05/2024 Time:  9:08 AM  Group Topic/Focus:  Goals Group:   The focus of this group is to help patients establish daily goals to achieve during treatment and discuss how the patient can incorporate goal setting into their daily lives to aide in recovery.    Participation Level:  Active  Participation Quality:  Appropriate  Affect:  Appropriate  Cognitive:  Appropriate  Insight: Appropriate  Engagement in Group:  Engaged  Modes of Intervention:  Discussion  Additional Comments:    Dalton Pena R Ariana Juul 07/05/2024, 9:08 AM

## 2024-07-05 NOTE — Progress Notes (Signed)
  [facility] Adult Case Management Discharge Plan :  Will you be returning to the same living situation after discharge:  Yes,  pt returning home at discharge  At discharge, do you have transportation home?: Yes,  pt will be picked up by cousin at 930AM Do you have the ability to pay for your medications: Yes,  pt has active health insurance coverage  Release of information consent forms completed and in the chart;  Patient's signature needed at discharge.  Patient to Follow up at:  Follow-up Information     [facility]. Go on 07/08/2024.   Why: You have a hospital follow up appointment on 07/08/24 at 9:00 am.  The appointment will be held in person.  Following this appointment, you will be scheduled for a clinical assessment to obtain therapy and medication management services. Contact information: [address] [place] [address] [phone]                 Next level of care provider has access to [facility] Link:no  Safety Planning and Suicide Prevention discussed: Yes,   [facility]) [phone]     Has patient been referred to the Quitline?: Patient refused referral for treatment  Patient has been referred for addiction treatment: No known substance use disorder.  Jenkins LULLA Primer, LCSWA 07/05/2024, 9:10 AM
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
## 2024-07-10 NOTE — Progress Notes (Signed)

## 2024-08-19 ENCOUNTER — Emergency Department
Admission: EM | Admit: 2024-08-19 | Discharge: 2024-08-19 | Disposition: A | Discharge status: Home / Self Care | Attending: Emergency Medicine | Admitting: Emergency Medicine

## 2024-08-19 ENCOUNTER — Other Ambulatory Visit: Payer: Self-pay

## 2024-08-19 DIAGNOSIS — I10 Essential (primary) hypertension: Secondary | ICD-10-CM | POA: Diagnosis not present

## 2024-08-19 DIAGNOSIS — Z760 Encounter for issue of repeat prescription: Secondary | ICD-10-CM | POA: Diagnosis not present

## 2024-08-19 DIAGNOSIS — M545 Low back pain, unspecified: Secondary | ICD-10-CM | POA: Insufficient documentation

## 2024-08-19 DIAGNOSIS — G8929 Other chronic pain: Secondary | ICD-10-CM | POA: Diagnosis not present

## 2024-08-19 MED ORDER — ESCITALOPRAM OXALATE 10 MG PO TABS: 10.0000 mg | ORAL_TABLET | Freq: Every day | ORAL | 0 refills | Status: AC

## 2024-08-19 MED ORDER — TIZANIDINE HCL 4 MG PO TABS
4.0000 mg | ORAL_TABLET | Freq: Three times a day (TID) | ORAL | 1 refills | Status: AC | PRN
Start: 2024-08-19 — End: 2025-08-19

## 2024-08-19 MED ORDER — AMLODIPINE BESYLATE 5 MG PO TABS: 5.0000 mg | ORAL_TABLET | Freq: Every day | ORAL | 0 refills | Status: DC

## 2024-08-19 MED ORDER — ALBUTEROL SULFATE HFA 108 (90 BASE) MCG/ACT IN AERS: 2.0000 | INHALATION_SPRAY | RESPIRATORY_TRACT | 3 refills | Status: AC | PRN

## 2024-08-19 MED ORDER — HYDROXYZINE HCL 25 MG PO TABS: 25.0000 mg | ORAL_TABLET | Freq: Four times a day (QID) | ORAL | 0 refills | Status: AC | PRN

## 2024-08-19 MED ORDER — RISPERIDONE 1 MG PO TABS: 1.0000 mg | ORAL_TABLET | Freq: Two times a day (BID) | ORAL | 0 refills | Status: AC

## 2024-08-19 MED ORDER — RIVAROXABAN 20 MG PO TABS: 20.0000 mg | ORAL_TABLET | Freq: Every day | ORAL | 3 refills | Status: AC

## 2024-08-19 NOTE — Discharge Instructions (Signed)
 Follow-up with your regular doctor for recheck.  We gave you refills so hopefully last you 3 months.  If you do not have a regular doctor here a list of primary care doctors that are accepting new patients  Please go to the following website to schedule new (and existing) patient appointments:   [email or web address]   The following is a list of primary care offices in the area who are accepting new patients at this time.  Please reach out to one of them directly and let them know you would like to schedule an appointment to follow up on an Emergency Department visit, and/or to establish a new primary care provider (PCP).  There are likely other primary care clinics in the are who are accepting new patients, but this is an excellent place to start:  [facility] Lead physician: Dr Jon Eva [address] [place], [place] [address] [phone]  [facility] Lead Physician: Dr Dorette Loron [address], [place], [place] [address] [phone]  [facility]  Lead Physician: Dr Duwaine Louder [address] [place], [place] [address] [phone]  [facility] Lead Physician: Dr Marolyn Officer [address] [place], [place] [address] [phone]  [facility] Lead Physician: Dr Leita Adie [address] [place], [place], [place] [address] [phone]

## 2024-08-19 NOTE — ED Provider Notes (Signed)
 [facility] Provider Note    Event Date/Time   First MD Initiated Contact with Patient 08/19/24 0805     (approximate)   History   Back Pain   HPI  Dalton Pena is a 43 y.o. male with history of PE, hypertension, major depressive disorder, and chronic back pain presents emergency department complaining of low back pain.  Patient states he is out of his medications.  He is out of all of his medication as he has been able to see his doctor.  Denies new symptoms of back pain.  No numbness or tingling that is new.  No difficulty with urination or abdominal pain.  No loss of bowel or bladder control.      Physical Exam   Triage Vital Signs: ED Triage Vitals  Encounter Vitals Group     BP 08/19/24 0751 (!) 151/94     Girls Systolic BP Percentile --      Girls Diastolic BP Percentile --      Boys Systolic BP Percentile --      Boys Diastolic BP Percentile --      Pulse Rate 08/19/24 0751 66     Resp 08/19/24 0751 18     Temp 08/19/24 0751 98.3 F (36.8 C)     Temp src --      SpO2 08/19/24 0751 99 %     Weight 08/19/24 0750 288 lb (130.6 kg)     Height 08/19/24 0750 5' 5 (1.651 m)     Head Circumference --      Peak Flow --      Pain Score 08/19/24 0750 8     Pain Loc --      Pain Education --      Exclude from Growth Chart --     Most recent vital signs: Vitals:   08/19/24 0751  BP: (!) 151/94  Pulse: 66  Resp: 18  Temp: 98.3 F (36.8 C)  SpO2: 99%     General: Awake, no distress.   CV:  Good peripheral perfusion. Resp:  Normal effort.  Abd:  No distention.   Other:  Lumbar spine and paravertebral muscles slightly tender to palpation, 5/5 strength lower extremities   ED Results / Procedures / Treatments   Labs (all labs ordered are listed, but only abnormal results are displayed) Labs Reviewed - No data to display   EKG     RADIOLOGY     PROCEDURES:   Procedures  Critical Care:  no Chief Complaint   Patient presents with   Back Pain      MEDICATIONS ORDERED IN ED: Medications - No data to display   IMPRESSION / MDM / ASSESSMENT AND PLAN / ED COURSE  I reviewed the triage vital signs and the nursing notes.                              Differential diagnosis includes, but is not limited to, chronic back pain, med refill, lumbar strain  Patient's presentation is most consistent with acute illness / injury with system symptoms.   Patient has chronic back pain.  Basically is here to have his medications refilled.  Did refill his medications.  Gave him a 25-month course so that he can either follow-up with his regular doctor or obtain a new PCP.  He is in agreement with this treatment plan.  Return emergency department worsening.  Discharged stable condition.  FINAL CLINICAL IMPRESSION(S) / ED DIAGNOSES   Final diagnoses:  Other chronic back pain  Medication refill     Rx / DC Orders   ED Discharge Orders          Ordered    albuterol  (VENTOLIN  HFA) 108 (90 Base) MCG/ACT inhaler  Every 4 hours PRN        08/19/24 0814    amLODipine  (NORVASC ) 5 MG tablet  Daily        08/19/24 0814    escitalopram  (LEXAPRO ) 10 MG tablet  Daily        08/19/24 0814    hydrOXYzine  (ATARAX ) 25 MG tablet  Every 6 hours PRN        08/19/24 0814    risperiDONE  (RISPERDAL ) 1 MG tablet  2 times daily        08/19/24 0814    rivaroxaban  (XARELTO ) 20 MG TABS tablet  Daily with supper        08/19/24 0814    tiZANidine  (ZANAFLEX ) 4 MG tablet  Every 8 hours PRN        08/19/24 9185             Note:  This document was prepared using Dragon voice recognition software and may include unintentional dictation errors.    Gasper Devere ORN, PA-C 08/19/24 9178    Ernest Ronal BRAVO, MD 08/20/24 [phone]

## 2024-08-19 NOTE — ED Notes (Signed)
 See triage note  Presents with pain down entire spine  States he was seen for same several months ago and dx'd with arthritis  States is currently out of meds and denies any recent injury

## 2024-08-19 NOTE — ED Triage Notes (Signed)
 Pt comes with back pain hurting on his spine. Pt states this has been going on for months. Pt states he is just out of his meds and came here bc he can't get to his doctor. Pt unsure of the name of the medications.

## 2024-09-07 ENCOUNTER — Encounter: Payer: Self-pay | Admitting: Cardiology

## 2024-09-08 ENCOUNTER — Ambulatory Visit: Attending: Nurse Practitioner | Admitting: Nurse Practitioner

## 2024-09-08 ENCOUNTER — Encounter: Payer: Self-pay | Admitting: Nurse Practitioner

## 2024-09-08 VITALS — BP 118/70 | HR 71 | Ht 65.0 in | Wt 300.4 lb

## 2024-09-08 DIAGNOSIS — I2699 Other pulmonary embolism without acute cor pulmonale: Secondary | ICD-10-CM | POA: Diagnosis present

## 2024-09-08 DIAGNOSIS — Z01818 Encounter for other preprocedural examination: Secondary | ICD-10-CM | POA: Insufficient documentation

## 2024-09-08 DIAGNOSIS — I82401 Acute embolism and thrombosis of unspecified deep veins of right lower extremity: Secondary | ICD-10-CM | POA: Diagnosis present

## 2024-09-08 DIAGNOSIS — R0609 Other forms of dyspnea: Secondary | ICD-10-CM | POA: Diagnosis not present

## 2024-09-08 DIAGNOSIS — F17200 Nicotine dependence, unspecified, uncomplicated: Secondary | ICD-10-CM | POA: Diagnosis present

## 2024-09-08 MED ORDER — AMLODIPINE BESYLATE 5 MG PO TABS: 5.0000 mg | ORAL_TABLET | Freq: Every day | ORAL | 3 refills | Status: AC

## 2024-09-08 NOTE — Patient Instructions (Signed)
 Medication Instructions:  Your physician recommends that you continue on your current medications as directed. Please refer to the Current Medication list given to you today.   *If you need a refill on your cardiac medications before your next appointment, please call your pharmacy*  Lab Work: No labs ordered today  If you have labs (blood work) drawn today and your tests are completely normal, you will receive your results only by: MyChart Message (if you have MyChart) OR A paper copy in the mail If you have any lab test that is abnormal or we need to change your treatment, we will call you to review the results.  Testing/Procedures:    Please report to Radiology at the [facility] Main Entrance 30 minutes early for your test.  [address] [place], [place] [address]                         OR   Please report to Radiology at [facility] Main Entrance, medical mall, 30 mins prior to your test.  [address]  [place], [place]  How to Prepare for Your Cardiac PET/CT Stress Test:  Nothing to eat or drink, except water, 3 hours prior to arrival time.  NO caffeine /decaffeinated products, or chocolate 12 hours prior to arrival. (Please note decaffeinated beverages (teas/coffees) still contain caffeine ).  If you have caffeine  within 12 hours prior, the test will need to be rescheduled.  Medication instructions: Do not take erectile dysfunction medications for 72 hours prior to test (sildenafil, tadalafil) Do not take nitrates (isosorbide mononitrate, Ranexa) the day before or day of test Do not take tamsulosin the day before or morning of test Hold theophylline containing medications for 12 hours. Hold Dipyridamole 48 hours prior to the test.  Diabetic Preparation: If able to eat breakfast prior to 3 hour fasting, you may take all medications, including your insulin. Do not worry if you miss your breakfast dose of insulin - start at your next  meal. If you do not eat prior to 3 hour fast-Hold all diabetes (oral and insulin) medications. Patients who wear a continuous glucose monitor MUST remove the device prior to scanning.  You may take your remaining medications with water.  NO perfume, cologne or lotion on chest or abdomen area. FEMALES - Please avoid wearing dresses to this appointment.  Total time is 1 to 2 hours; you may want to bring reading material for the waiting time.  IF YOU THINK YOU MAY BE PREGNANT, OR ARE NURSING PLEASE INFORM THE TECHNOLOGIST.  In preparation for your appointment, medication and supplies will be purchased.  Appointment availability is limited, so if you need to cancel or reschedule, please call the Radiology Department Scheduler at [phone] 24 hours in advance to avoid a cancellation fee of $100.00  What to Expect When you Arrive:  Once you arrive and check in for your appointment, you will be taken to a preparation room within the Radiology Department.  A technologist or Nurse will obtain your medical history, verify that you are correctly prepped for the exam, and explain the procedure.  Afterwards, an IV will be started in your arm and electrodes will be placed on your skin for EKG monitoring during the stress portion of the exam. Then you will be escorted to the PET/CT scanner.  There, staff will get you positioned on the scanner and obtain a blood pressure and EKG.  During the exam, you will  continue to be connected to the EKG and blood pressure machines.  A small, safe amount of a radioactive tracer will be injected in your IV to obtain a series of pictures of your heart along with an injection of a stress agent.    After your Exam:  It is recommended that you eat a meal and drink a caffeinated beverage to counter act any effects of the stress agent.  Drink plenty of fluids for the remainder of the day and urinate frequently for the first couple of hours after the exam.  Your doctor will  inform you of your test results within 7-10 business days.  For more information and frequently asked questions, please visit our website: [email or web address]  For questions about your test or how to prepare for your test, please call: Cardiac Imaging Nurse Navigators Office: [phone]   Follow-Up: At [facility], you and your health needs are our priority.  As part of our continuing mission to provide you with exceptional heart care, our providers are all part of one team.  This team includes your primary Cardiologist (physician) and Advanced Practice Providers or APPs (Physician Assistants and Nurse Practitioners) who all work together to provide you with the care you need, when you need it.  Your next appointment:   6 month(s)  Provider:   Lonni Hanson, MD or Lonni Meager, NP    We recommend signing up for the patient portal called MyChart.  Sign up information is provided on this After Visit Summary.  MyChart is used to connect with patients for Virtual Visits (Telemedicine).  Patients are able to view lab/test results, encounter notes, upcoming appointments, etc.  Non-urgent messages can be sent to your provider as well.   To learn more about what you can do with MyChart, go to [email or web address].

## 2024-09-08 NOTE — Progress Notes (Signed)
 Office Visit    Patient Name: Dalton Pena Date of Encounter: 09/08/2024  Primary Care Provider:  Pcp, Pena Primary Cardiologist:  Dalton Cave, MD  Cardiology APP:  Dalton Lonni Ingle, NP   Chief Complaint    43 y.o. 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  Past Medical History   Subjective   Past Medical History:  Diagnosis Date   Chronic anticoagulation    a. Xarelto  Rx in setting of recurrent DVT/PE.   Cocaine use    COPD (chronic obstructive pulmonary disease) (HCC)    Deep vein thrombosis (DVT) (HCC)    a. 03/2014 U/S: acute DVT, R mid-dist SFV and popliteal vein; b. 12/2022 U/S: mild R popliteal vein thrombus; c. 01/2024 U/S: Pena DVT.   Depression    Foraminal stenosis of thoracic region    History of echocardiogram    a. 03/2024 Echo: EF 60-65%, Pena rwma, nl RV fxn.   Low back pain    Morbid obesity (HCC)    Primary hypertension    Pulmonary embolism (HCC)    a. 03/2014 CTA: prob small bilat PE; b. 03/2020: Acute PE, distal R main PA extending into lobar and segmental branches of RML/RLL; c. 12/2022 V:Q scan: Apparent segmental perfusion defects involving the lingula and superior segment left lower lobe (Pena acute PE noted on CTA at that time).   Tobacco abuse    History reviewed. Pena pertinent surgical history.  Allergies  Pena Known Allergies     History of Present Illness      43 y.o. 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  Patient has a history of DVT and PE dating back to June 2015, with recurrent PE in June 2021, and recurrent right popliteal vein DVT in March 2024 (all recurrences in the  setting of noncompliance with anticoagulation).  CTA of the chest in March 2024 was negative for PE though VQ scan showed apparent segmental perfusion defects.  Patient has been chronically anticoagulated with Xarelto .  In April 2025, he was seen in the emergency department with chest pain and dyspnea.  CT of the chest was negative for PE/aortic dissection, and troponins were normal.  He established care with Dr. Cave in May 2025 in the setting of intermittent exertional chest pain, dyspnea on exertion, and family history of premature coronary artery disease (father had heart attack in his 60s).  He also required preoperative valuation pending spinal surgery.  Recommendation was made for echocardiogram and stress testing.  Echocardiogram in June 2025, showed an EF of 60 to 65% without regional wall motion abnormalities and normal RV function.  Patient did not follow through on stress testing.  [name] was admitted to behavioral health in September 2025 and the setting of major depression and substance abuse (cocaine).  Following medication adjustment, he was discharged.   In early November, he was seen in the emergency department due to back pain.  He had run out of all of his medication.  MRI showed thoracic spinal stenosis and lumbar disc disease.  He has yet to follow-up with surgery but does wish to follow through on his spinal surgery.  In that setting, he presents today for preop evaluation.  He does not experience chest pain but notes chronic dyspnea on exertion stating that he can only walk up maybe 1 flight of stairs before having to rest.  He is usually fine walking on flat ground but again, activity is limited.  He denies palpitations, PND, orthopnea, dizziness, syncope, edema, or early satiety.  He reports compliance with medications including amlodipine  and in that setting, blood pressure is better.  He continues to smoke about a half a pack of cigarettes per day.  He has not used any  cocaine since September admission. Objective   Home Medications    Current Outpatient Medications  Medication Sig Dispense Refill   albuterol  (VENTOLIN  HFA) 108 (90 Base) MCG/ACT inhaler Inhale 2 puffs into the lungs every 4 (four) hours as needed for wheezing or shortness of breath. 18 g 3   escitalopram  (LEXAPRO ) 10 MG tablet Take 1 tablet (10 mg total) by mouth daily. 90 tablet 0   hydrOXYzine  (ATARAX ) 25 MG tablet Take 1 tablet (25 mg total) by mouth every 6 (six) hours as needed for anxiety. 90 tablet 0   risperiDONE  (RISPERDAL ) 1 MG tablet Take 1 tablet (1 mg total) by mouth 2 (two) times daily. 180 tablet 0   rivaroxaban  (XARELTO ) 20 MG TABS tablet Take 1 tablet (20 mg total) by mouth daily with supper. 30 tablet 3   tiZANidine  (ZANAFLEX ) 4 MG tablet Take 1 tablet (4 mg total) by mouth every 8 (eight) hours as needed for muscle spasms. 90 tablet 1   amLODipine  (NORVASC ) 5 MG tablet Take 1 tablet (5 mg total) by mouth daily. 90 tablet 3   [Paused] umeclidinium bromide  (INCRUSE ELLIPTA ) 62.5 MCG/ACT AEPB Inhale 1 puff into the lungs daily. (Patient not taking: Reported on 09/08/2024)     Pena current facility-administered medications for this visit.     Physical Exam    VS:  BP 118/70 (BP Location: Left Wrist, Patient Position: Sitting, Cuff Size: Normal)   Pulse 71   Ht 5' 5 (1.651 m)   Wt (!) 300 lb 6 oz (136.2 kg)   BMI 49.99 kg/m  , BMI Body mass index is 49.99 kg/m.          GEN: Obese, in Pena acute distress. HEENT: normal. Neck: Supple, obese, difficult to gauge JVP.  Pena bruits or masses.  Cardiac: RRR, Pena murmurs, rubs, or gallops. Pena clubbing, cyanosis, edema.  Radials 2+/PT 2+ and equal bilaterally.  Respiratory:  Respirations regular and unlabored, clear to auscultation bilaterally. GI: Soft, nontender, nondistended, BS + x 4. MS: Pena deformity or atrophy. Skin: warm and dry, Pena rash. Neuro:  Strength and sensation are intact. Psych: Normal affect.  Accessory  Clinical Findings    ECG from September 23rd, 2025 ED visit-personally reviewed by me today -    Sinus bradycardia, 56- Pena acute changes.  Lab Results  Component Value Date   WBC 13.0 (H) 06/26/2024   HGB 16.4 06/26/2024   HCT 48.1 06/26/2024   MCV 99.0 06/26/2024   PLT 332 06/26/2024   Lab Results  Component Value Date   CREATININE 1.07 06/26/2024   BUN 8 06/26/2024   NA 139 06/26/2024   K 3.4 (L) 06/26/2024   CL 101 06/26/2024   CO2 26 06/26/2024   Lab Results  Component Value Date   ALT 24 06/26/2024   AST 22 06/26/2024   ALKPHOS 53 06/26/2024   BILITOT 0.6 06/26/2024   Lab Results  Component Value Date   CHOL 174 06/28/2024  HDL 31 (L) 06/28/2024   LDLCALC 113 (H) 06/28/2024   TRIG 150 (H) 06/28/2024   CHOLHDL 5.5 06/28/2024    Lab Results  Component Value Date   HGBA1C 5.7 (H) 06/28/2024   Lab Results  Component Value Date   TSH 1.610 06/29/2024       Assessment & Plan    1.  Preoperative cardiovascular examination/chronic back pain: Patient with a history of thoracic and lumbar disc disease and stenosis previous evaluated by neurosurgery.  He was seen by cardiology in the spring with recommendation for stress testing for preop evaluation in the setting of dyspnea on exertion, chest pain, and limited activity tolerance however, patient did not follow through.  He presents today to reestablish care as he wishes to proceed with surgery.  He continues to experience dyspnea on exertion in the setting of relatively sedentary lifestyle.  He denies chest pain today.  Based on his description, it does not appear that he is currently capable of achieving 4 METS.  In that setting, I will arrange for Lexiscan PET stress test to rule out ischemia and assess perioperative risk.  2.  Chronic dyspnea on exertion: Prior echo with normal LV function.  Stress testing planned as outlined above.  3.  Primary hypertension: Controlled today at 118/70 on amlodipine  therapy.  4.   History of DVT/PE: Patient with recurrent DVT/PE in the setting of noncompliance with anticoagulation in the past.  He notes good compliance and tolerance of Xarelto  at this time.  Echo in June 2025 showed normal RV function.  5.  Tobacco abuse/cocaine abuse: Still smoking half a pack of cigarettes per day.  Pena cocaine since September.  Complete cessation advised.  We discussed the use of nicotine  patches and he thinks he will try and obtain them over-the-counter.  With his history of severe depression, he may not be the best candidate for Chantix and we discussed this today.  6.  Depression: Managed by primary care provider.  7.  Disposition: Follow-up Lexiscan PET stress.  Follow-up in clinic in 6 months or sooner if testing abnormal.  Lonni Meager, NP 09/08/2024, 9:16 AM

## 2024-10-12 ENCOUNTER — Telehealth: Payer: Self-pay | Admitting: *Deleted

## 2024-10-12 DIAGNOSIS — R0609 Other forms of dyspnea: Secondary | ICD-10-CM

## 2024-10-12 NOTE — Telephone Encounter (Signed)
 Called the patient to let him know insurance denied his PET stress test.  Per Lonni Meager, NP: Olam, would you pls set him up for a 2 day lexiscan myoview?   Patient has agreed to the [facility]. Orders placed and instructions given.  Your provider has ordered a Lexiscan Myoview Stress test. This will take place at [facility]. Please report to the [facility] medical mall entrance. The volunteers at the first desk will direct you where to go.  [facility] MYOVIEW  Your provider has ordered a Stress Test with nuclear imaging. The purpose of this test is to evaluate the blood supply to your heart muscle. This procedure is referred to as a Non-Invasive Stress Test. This is because other than having an IV started in your vein, nothing is inserted or invades your body. Cardiac stress tests are done to find areas of poor blood flow to the heart by determining the extent of coronary artery disease (CAD). Some patients exercise on a treadmill, which naturally increases the blood flow to your heart, while others who are unable to walk on a treadmill due to physical limitations will have a pharmacologic/chemical stress agent called Lexiscan . This medicine will mimic walking on a treadmill by temporarily increasing your coronary blood flow.   Please note: these test may take anywhere between 2-4 hours to complete  How to prepare for your Myoview test:  Nothing to eat for 6 hours prior to the test No caffeine  for 24 hours prior to test No smoking 24 hours prior to test. Your medication may be taken with water.  If your doctor stopped a medication because of this test, do not take that medication. Ladies, please do not wear dresses.  Skirts or pants are appropriate. Please wear a short sleeve shirt. No perfume, cologne or lotion. Wear comfortable walking shoes. No heels!   PLEASE NOTIFY THE OFFICE AT LEAST 24 HOURS IN ADVANCE IF YOU ARE UNABLE TO KEEP YOUR APPOINTMENT.  [phone] AND  PLEASE NOTIFY NUCLEAR  MEDICINE AT [facility] AT LEAST 24 HOURS IN ADVANCE IF YOU ARE UNABLE TO KEEP YOUR APPOINTMENT. [phone]

## 2024-10-13 ENCOUNTER — Ambulatory Visit

## 2024-10-19 ENCOUNTER — Encounter
Admission: RE | Admit: 2024-10-19 | Discharge: 2024-10-19 | Disposition: A | Discharge status: Home / Self Care | Source: Ambulatory Visit | Attending: Nurse Practitioner | Admitting: Nurse Practitioner

## 2024-10-19 ENCOUNTER — Other Ambulatory Visit

## 2024-10-19 DIAGNOSIS — R0609 Other forms of dyspnea: Secondary | ICD-10-CM | POA: Insufficient documentation

## 2024-10-19 LAB — NM MYOCAR MULTI W/SPECT W/WALL MOTION / EF
LV dias vol: 137 mL (ref 62–150)
LV sys vol: 81 mL
Nuc Stress EF: 41 %
Peak HR: 81 {beats}/min
Rest HR: 54 {beats}/min
Rest Nuclear Isotope Dose: 10.9 mCi
SDS: 3
SRS: 17
SSS: 1
ST Depression (mm): 0 mm
Stress Nuclear Isotope Dose: 32.8 mCi
TID: 1.01

## 2024-10-19 MED ORDER — TECHNETIUM TC 99M TETROFOSMIN IV KIT
10.0000 | PACK | Freq: Once | INTRAVENOUS | Status: AC | PRN
  Administered 2024-10-19: 10.91 via INTRAVENOUS

## 2024-10-19 MED ORDER — REGADENOSON 0.4 MG/5ML IV SOLN
0.4000 mg | Freq: Once | INTRAVENOUS | Status: AC
  Administered 2024-10-19: 0.4 mg via INTRAVENOUS

## 2024-10-19 MED ORDER — TECHNETIUM TC 99M TETROFOSMIN IV KIT
32.7700 | PACK | Freq: Once | INTRAVENOUS | Status: AC | PRN
  Administered 2024-10-19: 32.77 via INTRAVENOUS

## 2024-10-20 ENCOUNTER — Encounter

## 2024-10-20 ENCOUNTER — Ambulatory Visit: Payer: Self-pay | Admitting: Nurse Practitioner
